# Patient Record
Sex: Female | Born: 1990 | Race: White | Hispanic: No | Marital: Married | Smoking: Former smoker
Health system: Southern US, Community
[De-identification: ages and names within clinical notes are randomized; demographics above are authoritative.]

## PROBLEM LIST (undated history)

## (undated) ENCOUNTER — Inpatient Hospital Stay (HOSPITAL_COMMUNITY): Payer: Self-pay

## (undated) DIAGNOSIS — F99 Mental disorder, not otherwise specified: Secondary | ICD-10-CM

## (undated) DIAGNOSIS — N809 Endometriosis, unspecified: Secondary | ICD-10-CM

## (undated) DIAGNOSIS — F32A Depression, unspecified: Secondary | ICD-10-CM

## (undated) DIAGNOSIS — F329 Major depressive disorder, single episode, unspecified: Secondary | ICD-10-CM

## (undated) DIAGNOSIS — L410 Pityriasis lichenoides et varioliformis acuta: Secondary | ICD-10-CM

## (undated) DIAGNOSIS — R87629 Unspecified abnormal cytological findings in specimens from vagina: Secondary | ICD-10-CM

## (undated) DIAGNOSIS — R809 Proteinuria, unspecified: Secondary | ICD-10-CM

## (undated) DIAGNOSIS — T1490XA Injury, unspecified, initial encounter: Secondary | ICD-10-CM

## (undated) DIAGNOSIS — Z8619 Personal history of other infectious and parasitic diseases: Secondary | ICD-10-CM

## (undated) DIAGNOSIS — J45909 Unspecified asthma, uncomplicated: Secondary | ICD-10-CM

## (undated) HISTORY — DX: Major depressive disorder, single episode, unspecified: F32.9

## (undated) HISTORY — DX: Depression, unspecified: F32.A

## (undated) HISTORY — PX: WISDOM TOOTH EXTRACTION: SHX21

## (undated) HISTORY — DX: Mental disorder, not otherwise specified: F99

## (undated) HISTORY — DX: Endometriosis, unspecified: N80.9

## (undated) HISTORY — DX: Unspecified abnormal cytological findings in specimens from vagina: R87.629

## (undated) HISTORY — DX: Personal history of other infectious and parasitic diseases: Z86.19

## (undated) HISTORY — DX: Pityriasis lichenoides et varioliformis acuta: L41.0

## (undated) HISTORY — DX: Injury, unspecified, initial encounter: T14.90XA

---
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
## 2012-06-24 ENCOUNTER — Encounter (HOSPITAL_COMMUNITY): Payer: Self-pay | Admitting: *Deleted

## 2012-06-24 ENCOUNTER — Emergency Department (HOSPITAL_COMMUNITY)
Admission: EM | Admit: 2012-06-24 | Discharge: 2012-06-24 | Disposition: A | Discharge status: Home / Self Care | Payer: PRIVATE HEALTH INSURANCE | Attending: Emergency Medicine | Admitting: Emergency Medicine

## 2012-06-24 DIAGNOSIS — R509 Fever, unspecified: Secondary | ICD-10-CM | POA: Insufficient documentation

## 2012-06-24 DIAGNOSIS — IMO0001 Reserved for inherently not codable concepts without codable children: Secondary | ICD-10-CM | POA: Insufficient documentation

## 2012-06-24 DIAGNOSIS — R3915 Urgency of urination: Secondary | ICD-10-CM | POA: Insufficient documentation

## 2012-06-24 DIAGNOSIS — F17200 Nicotine dependence, unspecified, uncomplicated: Secondary | ICD-10-CM | POA: Insufficient documentation

## 2012-06-24 DIAGNOSIS — N10 Acute tubulo-interstitial nephritis: Secondary | ICD-10-CM | POA: Insufficient documentation

## 2012-06-24 DIAGNOSIS — M549 Dorsalgia, unspecified: Secondary | ICD-10-CM | POA: Insufficient documentation

## 2012-06-24 DIAGNOSIS — R5381 Other malaise: Secondary | ICD-10-CM | POA: Insufficient documentation

## 2012-06-24 DIAGNOSIS — R350 Frequency of micturition: Secondary | ICD-10-CM | POA: Insufficient documentation

## 2012-06-24 DIAGNOSIS — R112 Nausea with vomiting, unspecified: Secondary | ICD-10-CM | POA: Insufficient documentation

## 2012-06-24 DIAGNOSIS — Z3202 Encounter for pregnancy test, result negative: Secondary | ICD-10-CM | POA: Insufficient documentation

## 2012-06-24 DIAGNOSIS — R300 Dysuria: Secondary | ICD-10-CM | POA: Insufficient documentation

## 2012-06-24 LAB — BASIC METABOLIC PANEL
Calcium: 8.9 mg/dL (ref 8.4–10.5)
GFR calc Af Amer: >90 mL/min (ref >90)
GFR calc non Af Amer: >90 mL/min (ref >90)
Glucose, Bld: 133 mg/dL — ABNORMAL HIGH (ref 70–99)
Potassium: 4 mEq/L (ref 3.5–5.1)
Sodium: 134 mEq/L — ABNORMAL LOW (ref 135–145)

## 2012-06-24 LAB — CBC WITH DIFFERENTIAL/PLATELET
Basophils Absolute: 0 10*3/uL (ref 0.0–0.1)
Eosinophils Absolute: 0 10*3/uL (ref 0.0–0.7)
Lymphs Abs: 0.7 10*3/uL (ref 0.7–4.0)
MCH: 30.9 pg (ref 26.0–34.0)
Neutrophils Relative %: 87 % — ABNORMAL HIGH (ref 43–77)
Platelets: 230 10*3/uL (ref 150–400)
RBC: 4.4 MIL/uL (ref 3.87–5.11)
RDW: 12.3 % (ref 11.5–15.5)
WBC: 14.4 10*3/uL — ABNORMAL HIGH (ref 4.0–10.5)

## 2012-06-24 LAB — URINALYSIS, ROUTINE W REFLEX MICROSCOPIC
Ketones, ur: NEGATIVE mg/dL
Nitrite: POSITIVE — AB
Protein, ur: 30 mg/dL — AB
Urobilinogen, UA: 0.2 mg/dL (ref 0.0–1.0)

## 2012-06-24 LAB — URINE MICROSCOPIC-ADD ON

## 2012-06-24 LAB — PREGNANCY, URINE: Preg Test, Ur: NEGATIVE

## 2012-06-24 MED ORDER — HYDROCODONE-ACETAMINOPHEN 5-325 MG PO TABS: ORAL_TABLET | ORAL | Status: DC

## 2012-06-24 MED ORDER — ACETAMINOPHEN 325 MG PO TABS
650.0000 mg | ORAL_TABLET | Freq: Once | ORAL | Status: AC
  Administered 2012-06-24: 650 mg via ORAL
  Filled 2012-06-24: qty 2

## 2012-06-24 MED ORDER — SODIUM CHLORIDE 0.9 % IV SOLN
Freq: Once | INTRAVENOUS | Status: AC
  Administered 2012-06-24: 10:00:00 via INTRAVENOUS

## 2012-06-24 MED ORDER — CEPHALEXIN 500 MG PO CAPS: 500.0000 mg | ORAL_CAPSULE | Freq: Four times a day (QID) | ORAL | Status: DC

## 2012-06-24 MED ORDER — ONDANSETRON HCL 4 MG/2ML IJ SOLN
4.0000 mg | Freq: Once | INTRAMUSCULAR | Status: AC
  Administered 2012-06-24: 4 mg via INTRAVENOUS
  Filled 2012-06-24: qty 2

## 2012-06-24 MED ORDER — DEXTROSE 5 % IV SOLN
1.0000 g | Freq: Once | INTRAVENOUS | Status: AC
  Administered 2012-06-24: 1 g via INTRAVENOUS
  Filled 2012-06-24: qty 10

## 2012-06-24 MED ORDER — MORPHINE SULFATE 4 MG/ML IJ SOLN
4.0000 mg | Freq: Once | INTRAMUSCULAR | Status: AC
  Administered 2012-06-24: 4 mg via INTRAVENOUS
  Filled 2012-06-24: qty 1

## 2012-06-24 NOTE — ED Notes (Signed)
Pt c/o left flank pain that started a few days ago, fever, n/v

## 2012-06-24 NOTE — ED Provider Notes (Signed)
History     CSN: [id number]  Arrival date & time 06/24/12  0903   First MD Initiated Contact with Patient 06/24/12 0945      Chief Complaint  Patient presents with  . Flank Pain    (Consider location/radiation/quality/duration/timing/severity/associated sxs/prior treatment) HPI Comments: Patient reports left flank tenderness and dysuria for one week.  States that several hours PTA, since began having chills, fever, and nausea vomiting.  She states that she has a hx of frequent urinary tract infections and "problems with my kidneys" in the past.  She denies vaginal discharge, pelvic pain, pain with intercourse or vaginal bleeding.  States the pain feels like a "kidney infection".  She denies shortness of breath, abdominal pain, hx of PID or kidney stones, chest pain or recent cough  Patient is a 22 y.o. female presenting with flank pain. The history is provided by the patient.  Flank Pain This is a new problem. The current episode started in the past 7 days. The problem occurs constantly. The problem has been gradually worsening. Associated symptoms include chills, fatigue, a fever, myalgias, nausea, urinary symptoms and vomiting. Pertinent negatives include no abdominal pain, arthralgias, change in bowel habit, chest pain, congestion, coughing, diaphoresis, headaches, joint swelling, neck pain, numbness, rash, sore throat, swollen glands, vertigo, visual change or weakness. Exacerbated by: urinating. Treatments tried: OTC AZO and tylenol. The treatment provided no relief.    History reviewed. No pertinent past medical history.  History reviewed. No pertinent past surgical history.  No family history on file.  History  Substance Use Topics  . Smoking status: Current Every Day Smoker  . Smokeless tobacco: Not on file  . Alcohol Use: Yes    OB History   Grav Para Term Preterm Abortions TAB SAB Ect Mult Living                  Review of Systems  Constitutional: Positive for  fever, chills and fatigue. Negative for diaphoresis.  HENT: Negative for congestion, sore throat, neck pain and neck stiffness.   Respiratory: Negative for cough.   Cardiovascular: Negative for chest pain.  Gastrointestinal: Positive for nausea and vomiting. Negative for abdominal pain and change in bowel habit.  Genitourinary: Positive for dysuria, urgency, frequency and flank pain. Negative for hematuria, decreased urine volume, vaginal bleeding, vaginal discharge, difficulty urinating, genital sores, menstrual problem and pelvic pain.  Musculoskeletal: Positive for myalgias and back pain. Negative for joint swelling and arthralgias.  Skin: Negative for rash.  Neurological: Negative for dizziness, vertigo, syncope, weakness, numbness and headaches.  All other systems reviewed and are negative.    Allergies  Review of patient's allergies indicates no known allergies.  Home Medications  No current outpatient prescriptions on file.  BP 119/63  Pulse 98  Temp(Src) 98.8 F (37.1 C) (Oral)  Resp 20  Ht 5\' 6"  (1.676 m)  Wt 150 lb (68.04 kg)  BMI 24.22 kg/m2  SpO2 96%  LMP 06/03/2012  Physical Exam  Nursing note and vitals reviewed. Constitutional: She is oriented to person, place, and time. She appears well-developed and well-nourished.  Appears uncomfortable  HENT:  Head: Normocephalic and atraumatic.  Mouth/Throat: Oropharynx is clear and moist.  Neck: Normal range of motion. Neck supple. No thyromegaly present.  Cardiovascular: Regular rhythm, normal heart sounds and intact distal pulses.   No murmur heard. tachycardia  Pulmonary/Chest: Effort normal and breath sounds normal. No respiratory distress. She has no wheezes. She has no rales. She exhibits no tenderness.  Abdominal:  Soft. Bowel sounds are normal. She exhibits no distension and no mass. There is no hepatosplenomegaly, splenomegaly or hepatomegaly. There is no tenderness. There is CVA tenderness. There is no rebound  and no guarding.  Left sided CVA tenderness on exam.  No edema or ecchymosis.   Musculoskeletal: Normal range of motion. She exhibits no edema and no tenderness.  Lymphadenopathy:    She has no cervical adenopathy.  Neurological: She is alert and oriented to person, place, and time. She exhibits normal muscle tone. Coordination normal.  Skin: Skin is warm and dry.  Psychiatric: She has a normal mood and affect. Her behavior is normal.    ED Course  Procedures (including critical care time)  Labs Reviewed  CBC WITH DIFFERENTIAL - Abnormal; Notable for the following:    WBC 14.4 (*)    Neutrophils Relative 87 (*)    Neutro Abs 12.5 (*)    Lymphocytes Relative 5 (*)    Monocytes Absolute 1.1 (*)    All other components within normal limits  BASIC METABOLIC PANEL - Abnormal; Notable for the following:    Sodium 134 (*)    Glucose, Bld 133 (*)    All other components within normal limits  URINALYSIS, ROUTINE W REFLEX MICROSCOPIC - Abnormal; Notable for the following:    APPearance CLOUDY (*)    Hgb urine dipstick MODERATE (*)    Protein, ur 30 (*)    Nitrite POSITIVE (*)    Leukocytes, UA SMALL (*)    All other components within normal limits  URINE MICROSCOPIC-ADD ON - Abnormal; Notable for the following:    Squamous Epithelial / LPF MANY (*)    Bacteria, UA FEW (*)    All other components within normal limits  URINE CULTURE  PREGNANCY, URINE      Urine culture pending  MDM  Patient with hx of "kidney problems" and frequent UTI's reported having dysuria a week ago and self treated with AZO.  On exam she has left CVA tenderness, fever and hx of vomiting.  clinical picture is c/w pyelonephritis.  Will order labs and rehydrate.    On re-evaluation, patient is feeling better, vitals improved,previous  tachycardia and fever improved. Labs also support diagnosis of pyelo.  Kidney function is wnml, She is non-toxic appearing.  No further nausea or vomiting during ED stay.     She has received IVF's, Rocephin, anti-emetic and morphine.  Patient is requesting to go home.  She agrees to close f/u with her PMD or to return here in 1-2 days if the symptoms are not improving.  Will prescribe keflex for 7 days and advised pt of importance to finished abx and have urine rechecked after completion of abx.        Lenka Zhao L. Trisha Mangle, PA-C 06/26/12 2218

## 2012-06-26 LAB — URINE CULTURE: Colony Count: >=100000

## 2012-06-27 NOTE — ED Notes (Signed)
+   urine Patient treated with keflex-sensitive to same-chart appended per protocol MD. 

## 2012-06-27 NOTE — ED Provider Notes (Signed)
Medical screening examination/treatment/procedure(s) were performed by non-physician practitioner and as supervising physician I was immediately available for consultation/collaboration.   Chamya Hunton W. Aristide Waggle, MD 06/27/12 1551 

## 2013-09-23 IMAGING — CR DG FOREARM 2V*L*
1 series · 1 of 1 positions shown · non-contrast
Comparison: None.

CLINICAL DATA: Medial arm pain.

LEFT FOREARM - 2 VIEW

[view not recorded]
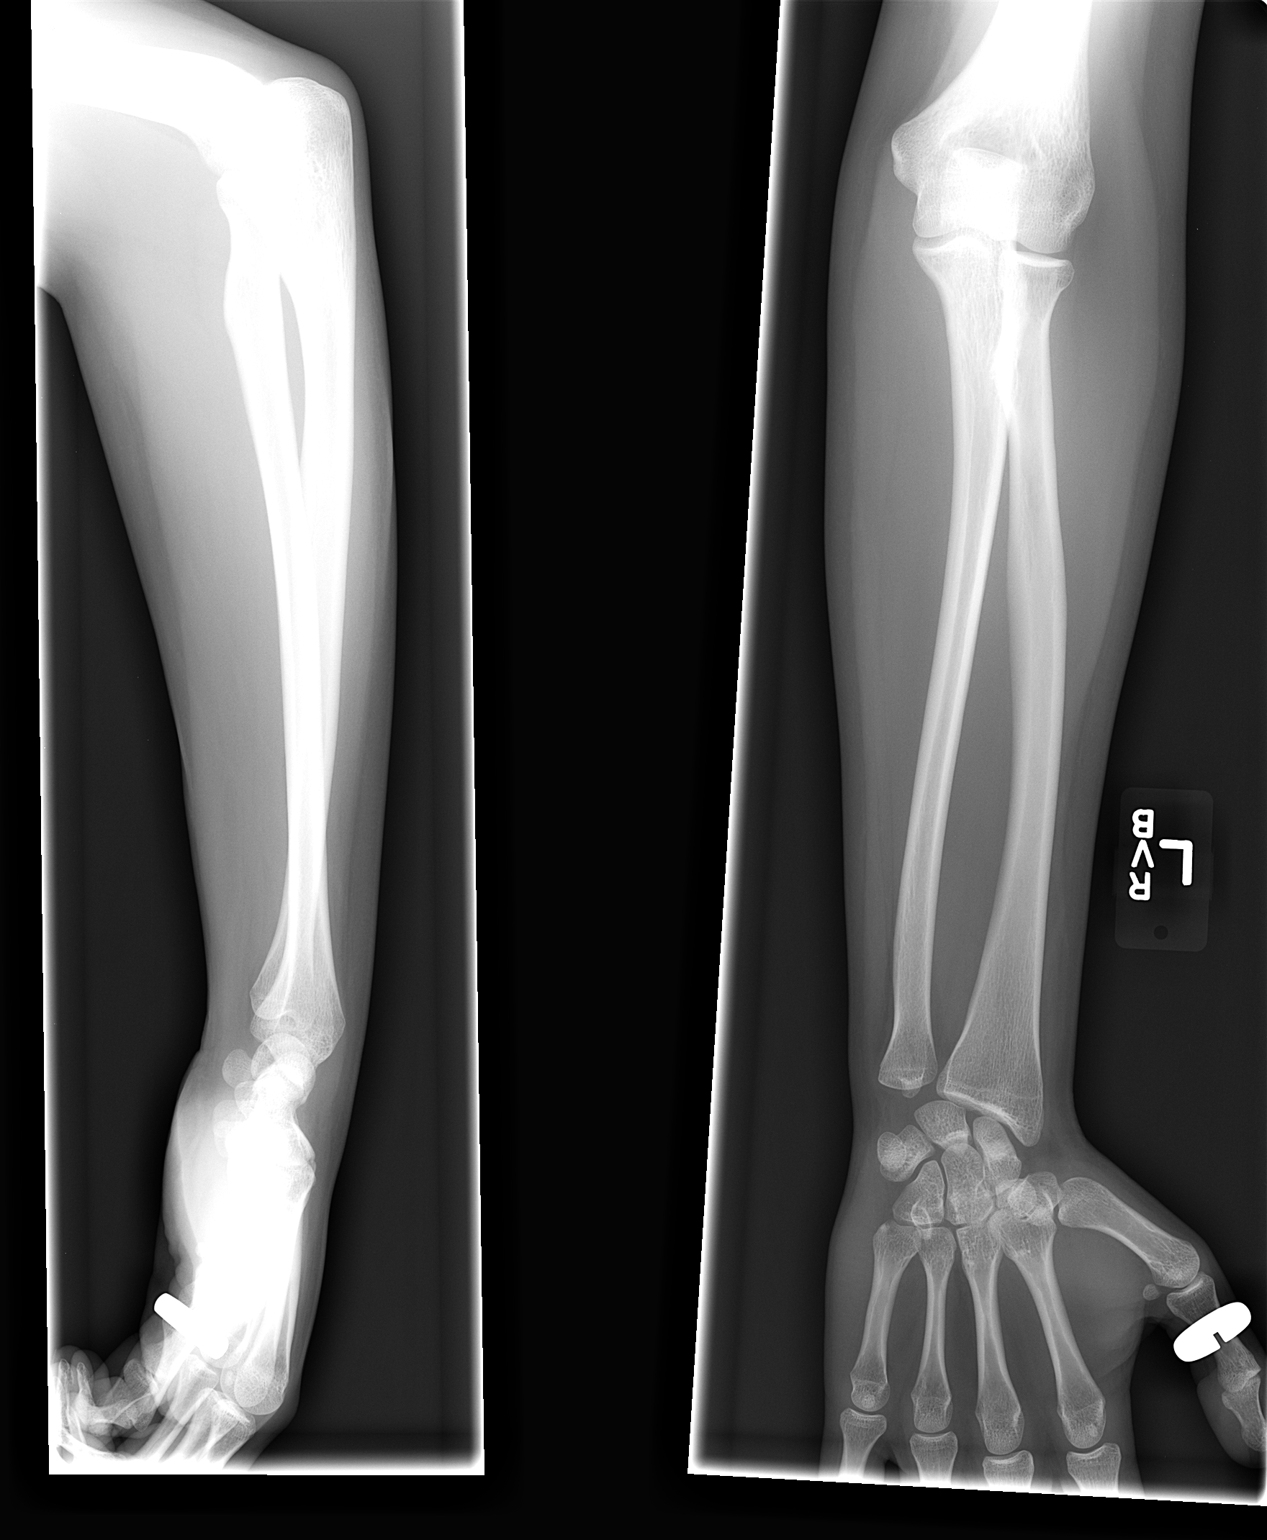

[1 of 1 positions shown; findings below may reference images not displayed]

FINDINGS: Imaged bones, joints and soft tissues appear normal.
IMPRESSION: Negative study.

## 2017-04-07 ENCOUNTER — Ambulatory Visit (INDEPENDENT_AMBULATORY_CARE_PROVIDER_SITE_OTHER): Payer: BLUE CROSS/BLUE SHIELD | Admitting: Adult Health

## 2017-04-07 ENCOUNTER — Encounter (INDEPENDENT_AMBULATORY_CARE_PROVIDER_SITE_OTHER): Payer: Self-pay

## 2017-04-07 ENCOUNTER — Encounter: Payer: Self-pay | Admitting: Adult Health

## 2017-04-07 ENCOUNTER — Other Ambulatory Visit (HOSPITAL_COMMUNITY)
Admission: RE | Admit: 2017-04-07 | Discharge: 2017-04-07 | Disposition: A | Discharge status: Home / Self Care | Payer: BLUE CROSS/BLUE SHIELD | Source: Ambulatory Visit | Attending: Adult Health | Admitting: Adult Health

## 2017-04-07 ENCOUNTER — Other Ambulatory Visit: Payer: Self-pay | Admitting: Adult Health

## 2017-04-07 VITALS — BP 92/60 | HR 97 | Ht 66.0 in | Wt 150.0 lb

## 2017-04-07 DIAGNOSIS — Z01419 Encounter for gynecological examination (general) (routine) without abnormal findings: Secondary | ICD-10-CM | POA: Insufficient documentation

## 2017-04-07 DIAGNOSIS — Z3202 Encounter for pregnancy test, result negative: Secondary | ICD-10-CM | POA: Diagnosis not present

## 2017-04-07 DIAGNOSIS — F418 Other specified anxiety disorders: Secondary | ICD-10-CM | POA: Insufficient documentation

## 2017-04-07 DIAGNOSIS — N946 Dysmenorrhea, unspecified: Secondary | ICD-10-CM | POA: Insufficient documentation

## 2017-04-07 DIAGNOSIS — N941 Unspecified dyspareunia: Secondary | ICD-10-CM | POA: Insufficient documentation

## 2017-04-07 DIAGNOSIS — Z8742 Personal history of other diseases of the female genital tract: Secondary | ICD-10-CM | POA: Diagnosis not present

## 2017-04-07 DIAGNOSIS — Z01411 Encounter for gynecological examination (general) (routine) with abnormal findings: Secondary | ICD-10-CM | POA: Diagnosis not present

## 2017-04-07 DIAGNOSIS — F329 Major depressive disorder, single episode, unspecified: Secondary | ICD-10-CM | POA: Diagnosis not present

## 2017-04-07 DIAGNOSIS — N920 Excessive and frequent menstruation with regular cycle: Secondary | ICD-10-CM | POA: Insufficient documentation

## 2017-04-07 DIAGNOSIS — F32A Depression, unspecified: Secondary | ICD-10-CM

## 2017-04-07 LAB — POCT URINE PREGNANCY: PREG TEST UR: NEGATIVE

## 2017-04-07 MED ORDER — PRENATAL GUMMIES/DHA & FA 0.4-32.5 MG PO CHEW: 3.0000 | CHEWABLE_TABLET | Freq: Every day | ORAL | Status: DC
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
## 2017-04-07 NOTE — Patient Instructions (Signed)
Preparing for Pregnancy If you are considering becoming pregnant, make an appointment to see your regular health care provider to learn how to prepare for a safe and healthy pregnancy (preconception care). During a preconception care visit, your health care provider will:  Do a complete physical exam, including a Pap test.  Take a complete medical history.  Give you information, answer your questions, and help you resolve problems.  Preconception checklist Medical history  Tell your health care provider about any current or past medical conditions. Your pregnancy or your ability to become pregnant may be affected by chronic conditions, such as diabetes, chronic hypertension, and thyroid problems.  Include your family's medical history as well as your partner's medical history.  Tell your health care provider about any history of STIs (sexually transmitted infections).These can affect your pregnancy. In some cases, they can be passed to your baby. Discuss any concerns that you have about STIs.  If indicated, discuss the benefits of genetic testing. This testing will show whether there are any genetic conditions that may be passed from you or your partner to your baby.  Tell your health care provider about: ? Any problems you have had with conception or pregnancy. ? Any medicines you take. These include vitamins, herbal supplements, and over-the-counter medicines. ? Your history of immunizations. Discuss any vaccinations that you may need.  Diet  Ask your health care provider what to include in a healthy diet that has a balance of nutrients. This is especially important when you are pregnant or preparing to become pregnant.  Ask your health care provider to help you reach a healthy weight before pregnancy. ? If you are overweight, you may be at higher risk for certain complications, such as high blood pressure, diabetes, and preterm birth. ? If you are underweight, you are more likely  to have a baby who has a low birth weight.  Lifestyle, work, and home  Let your health care provider know: ? About any lifestyle habits that you have, such as alcohol use, drug use, or smoking. ? About recreational activities that may put you at risk during pregnancy, such as downhill skiing and certain exercise programs. ? Tell your health care provider about any international travel, especially any travel to places with an active Zika virus outbreak. ? About harmful substances that you may be exposed to at work or at home. These include chemicals, pesticides, radiation, or even litter boxes. ? If you do not feel safe at home.  Mental health  Tell your health care provider about: ? Any history of mental health conditions, including feelings of depression, sadness, or anxiety. ? Any medicines that you take for a mental health condition. These include herbs and supplements.  Home instructions to prepare for pregnancy Lifestyle  Eat a balanced diet. This includes fresh fruits and vegetables, whole grains, lean meats, low-fat dairy products, healthy fats, and foods that are high in fiber. Ask to meet with a nutritionist or registered dietitian for assistance with meal planning and goals.  Get regular exercise. Try to be active for at least 30 minutes a day on most days of the week. Ask your health care provider which activities are safe during pregnancy.  Do not use any products that contain nicotine or tobacco, such as cigarettes and e-cigarettes. If you need help quitting, ask your health care provider.  Do not drink alcohol.  Do not take illegal drugs.  Maintain a healthy weight. Ask your health care provider what weight range is   right for you.  General instructions  Keep an accurate record of your menstrual periods. This makes it easier for your health care provider to determine your baby's due date.  Begin taking prenatal vitamins and folic acid supplements daily as directed by  your health care provider.  Manage any chronic conditions, such as high blood pressure and diabetes, as told by your health care provider. This is important.  How do I know that I am pregnant? You may be pregnant if you have been sexually active and you miss your period. Symptoms of early pregnancy include:  Mild cramping.  Very light vaginal bleeding (spotting).  Feeling unusually tired.  Nausea and vomiting (morning sickness).  If you have any of these symptoms and you suspect that you might be pregnant, you can take a home pregnancy test. These tests check for a hormone in your urine (human chorionic gonadotropin, or hCG). A woman's body begins to make this hormone during early pregnancy. These tests are very accurate. Wait until at least the first day after you miss your period to take one. If the test shows that you are pregnant (you get a positive result), call your health care provider to make an appointment for prenatal care. What should I do if I become pregnant?  Make an appointment with your health care provider as soon as you suspect you are pregnant.  Do not use any products that contain nicotine, such as cigarettes, chewing tobacco, and e-cigarettes. If you need help quitting, ask your health care provider.  Do not drink alcoholic beverages. Alcohol is related to a number of birth defects.  Avoid toxic odors and chemicals.  You may continue to have sexual intercourse if it does not cause pain or other problems, such as vaginal bleeding. This information is not intended to replace advice given to you by your health care provider. Make sure you discuss any questions you have with your health care provider. Document Released: 01/28/2008 Document Revised: 10/13/2015 Document Reviewed: 09/06/2015 Elsevier Interactive Patient Education  2018 [employer]  

## 2017-04-10 LAB — CYTOLOGY - PAP
CHLAMYDIA, DNA PROBE: NEGATIVE
Diagnosis: NEGATIVE
Neisseria Gonorrhea: NEGATIVE

## 2018-01-10 ENCOUNTER — Encounter: Payer: Self-pay | Admitting: Obstetrics and Gynecology

## 2018-01-10 ENCOUNTER — Ambulatory Visit: Payer: Self-pay | Admitting: Obstetrics and Gynecology

## 2018-01-10 DIAGNOSIS — Z3169 Encounter for other general counseling and advice on procreation: Secondary | ICD-10-CM

## 2018-01-10 MED ORDER — PRENATAL VITAMINS 0.8 MG PO TABS: 1.0000 | ORAL_TABLET | Freq: Every day | ORAL | 12 refills | Status: DC

## 2018-01-10 NOTE — Patient Instructions (Signed)

## 2018-01-10 NOTE — Progress Notes (Signed)
Ms Cheyenne Bauer is her for preconception counseling. She and her husband now desire children She is a nulligravid. Regular monthly cycles H/O endometriosis (Dx by Sx) and Insomnia She does use "Sleep aid" for insomnia Stopped smoking 2 months ago Husband healthily, no children Pap this past yr normal  PE AF VSS Lungs clear Heart RRR Abd soft + BS  A/P Preconception Counseling Diet, exercise and PNV reviewed with pt. Apps for timing IC to increase fertility reviewed F/U with pregnancy or PRN

## 2018-02-13 ENCOUNTER — Telehealth: Payer: Self-pay | Admitting: *Deleted

## 2018-02-13 NOTE — Telephone Encounter (Signed)
Patient called stating she is a week late for her period.  She and her husband are trying to conceive. She has taken a pregnancy test but it was negative but is having severe cramping and bleeding with clots since 6am this morning. She has endometriosis and is used to the pain but states this pain is very different. She has tried taking Ibuprofen, used a heating pad but nothing is touching the pain. Please advise.

## 2018-02-13 NOTE — Telephone Encounter (Signed)
Spoke to patient and advised to go to ER per JAG for U/S and labs.  Pt verbalized understanding with no further questions.
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
## 2018-04-18 ENCOUNTER — Other Ambulatory Visit: Payer: Self-pay

## 2018-04-18 ENCOUNTER — Encounter: Payer: Self-pay | Admitting: Adult Health

## 2018-04-18 ENCOUNTER — Ambulatory Visit (INDEPENDENT_AMBULATORY_CARE_PROVIDER_SITE_OTHER): Payer: BLUE CROSS/BLUE SHIELD | Admitting: Adult Health

## 2018-04-18 VITALS — BP 132/86 | HR 92 | Ht 65.0 in | Wt 160.0 lb

## 2018-04-18 DIAGNOSIS — N926 Irregular menstruation, unspecified: Secondary | ICD-10-CM | POA: Diagnosis not present

## 2018-04-18 DIAGNOSIS — O09291 Supervision of pregnancy with other poor reproductive or obstetric history, first trimester: Secondary | ICD-10-CM | POA: Diagnosis not present

## 2018-04-18 DIAGNOSIS — Z3A01 Less than 8 weeks gestation of pregnancy: Secondary | ICD-10-CM | POA: Insufficient documentation

## 2018-04-18 DIAGNOSIS — O3680X0 Pregnancy with inconclusive fetal viability, not applicable or unspecified: Secondary | ICD-10-CM

## 2018-04-18 DIAGNOSIS — Z3201 Encounter for pregnancy test, result positive: Secondary | ICD-10-CM | POA: Diagnosis not present

## 2018-04-18 LAB — POCT URINE PREGNANCY: Preg Test, Ur: POSITIVE — AB

## 2018-04-18 NOTE — Progress Notes (Signed)
Patient ID: Cheyenne Bauer, female   DOB: 1990-09-08, 28 y.o.   MRN: [id number] History of Present Illness:  Cheyenne Bauer is a 28 year old white female, married, in for physical but changed to just UPT.Had +HPT this am.She had miscarriage in December.  PCP is Dr Reuel Boom.  Current Medications, Allergies, Past Medical History, Past Surgical History, Family History and Social History were reviewed in [name] record.     Review of Systems: +missed period, +HPT this am Mild cramps   Physical Exam:BP 132/86 (BP Location: Left Arm, Patient Position: Sitting, Cuff Size: Normal)   Pulse 92   Ht 5\' 5"  (1.651 m)   Wt 160 lb (72.6 kg)   LMP 03/16/2018 (Exact Date)   BMI 26.63 kg/m   UPT+, about 4+5 weeks by LMP with EDD 12/21/2018 General:  Well developed, well nourished, no acute distress Skin:  Warm and dry Neck:  Midline trachea, normal thyroid, good ROM, no lymphadenopathy Lungs; Clear to auscultation bilaterally Cardiovascular: Regular rate and rhythm Abdomen:  Soft, non tender Psych:  No mood changes, alert and cooperative,seems happy Fall risk is low. PHQ 9 score 21, sad still from miscarriage in December, denies being suicidal, does not needs meds.  Impression: 1. Pregnancy test positive   2. Less than [redacted] weeks gestation of pregnancy   3. Encounter to determine fetal viability of pregnancy, single or unspecified fetus   4. History of miscarriage, currently pregnant, first trimester       Plan: Check QHCG and progesterone Continue PNV Return in 2 weeks for dating US/4 weeks new OB Review handouts on First trimester and by [facility] and Preparing for [employer]

## 2018-04-18 NOTE — Patient Instructions (Signed)
First Trimester of Pregnancy  The first trimester of pregnancy is from week 1 until the end of week 13 (months 1 through 3). A week after a sperm fertilizes an egg, the egg will implant on the wall of the uterus. This embryo will begin to develop into a baby. Genes from you and your partner will form the baby. The female genes will determine whether the baby will be a boy or a girl. At 6-8 weeks, the eyes and face will be formed, and the heartbeat can be seen on ultrasound. At the end of 12 weeks, all the baby's organs will be formed.  Now that you are pregnant, you will want to do everything you can to have a healthy baby. Two of the most important things are to get good prenatal care and to follow your health care provider's instructions. Prenatal care is all the medical care you receive before the baby's birth. This care will help prevent, find, and treat any problems during the pregnancy and childbirth.  Body changes during your first trimester  Your body goes through many changes during pregnancy. The changes vary from woman to woman.   You may gain or lose a couple of pounds at first.   You may feel sick to your stomach (nauseous) and you may throw up (vomit). If the vomiting is uncontrollable, call your health care provider.   You may tire easily.   You may develop headaches that can be relieved by medicines. All medicines should be approved by your health care provider.   You may urinate more often. Painful urination may mean you have a bladder infection.   You may develop heartburn as a result of your pregnancy.   You may develop constipation because certain hormones are causing the muscles that push stool through your intestines to slow down.   You may develop hemorrhoids or swollen veins (varicose veins).   Your breasts may begin to grow larger and become tender. Your nipples may stick out more, and the tissue that surrounds them (areola) may become darker.   Your gums may bleed and may be  sensitive to brushing and flossing.   Dark spots or blotches (chloasma, mask of pregnancy) may develop on your face. This will likely fade after the baby is born.   Your menstrual periods will stop.   You may have a loss of appetite.   You may develop cravings for certain kinds of food.   You may have changes in your emotions from day to day, such as being excited to be pregnant or being concerned that something may go wrong with the pregnancy and baby.   You may have more vivid and strange dreams.   You may have changes in your hair. These can include thickening of your hair, rapid growth, and changes in texture. Some women also have hair loss during or after pregnancy, or hair that feels dry or thin. Your hair will most likely return to normal after your baby is born.  What to expect at prenatal visits  During a routine prenatal visit:   You will be weighed to make sure you and the baby are growing normally.   Your blood pressure will be taken.   Your abdomen will be measured to track your baby's growth.   The fetal heartbeat will be listened to between weeks 10 and 14 of your pregnancy.   Test results from any previous visits will be discussed.  Your health care provider may ask you:     How you are feeling.   If you are feeling the baby move.   If you have had any abnormal symptoms, such as leaking fluid, bleeding, severe headaches, or abdominal cramping.   If you are using any tobacco products, including cigarettes, chewing tobacco, and electronic cigarettes.   If you have any questions.  Other tests that may be performed during your first trimester include:   Blood tests to find your blood type and to check for the presence of any previous infections. The tests will also be used to check for low iron levels (anemia) and protein on red blood cells (Rh antibodies). Depending on your risk factors, or if you previously had diabetes during pregnancy, you may have tests to check for high blood sugar  that affects pregnant women (gestational diabetes).   Urine tests to check for infections, diabetes, or protein in the urine.   An ultrasound to confirm the proper growth and development of the baby.   Fetal screens for spinal cord problems (spina bifida) and Down syndrome.   HIV (human immunodeficiency virus) testing. Routine prenatal testing includes screening for HIV, unless you choose not to have this test.   You may need other tests to make sure you and the baby are doing well.  Follow these instructions at home:  Medicines   Follow your health care provider's instructions regarding medicine use. Specific medicines may be either safe or unsafe to take during pregnancy.   Take a prenatal vitamin that contains at least 600 micrograms (mcg) of folic acid.   If you develop constipation, try taking a stool softener if your health care provider approves.  Eating and drinking     Eat a balanced diet that includes fresh fruits and vegetables, whole grains, good sources of protein such as meat, eggs, or tofu, and low-fat dairy. Your health care provider will help you determine the amount of weight gain that is right for you.   Avoid raw meat and uncooked cheese. These carry germs that can cause birth defects in the baby.   Eating four or five small meals rather than three large meals a day may help relieve nausea and vomiting. If you start to feel nauseous, eating a few soda crackers can be helpful. Drinking liquids between meals, instead of during meals, also seems to help ease nausea and vomiting.   Limit foods that are high in fat and processed sugars, such as fried and sweet foods.   To prevent constipation:  ? Eat foods that are high in fiber, such as fresh fruits and vegetables, whole grains, and beans.  ? Drink enough fluid to keep your urine clear or pale yellow.  Activity   Exercise only as directed by your health care provider. Most women can continue their usual exercise routine during  pregnancy. Try to exercise for 30 minutes at least 5 days a week. Exercising will help you:  ? Control your weight.  ? Stay in shape.  ? Be prepared for labor and delivery.   Experiencing pain or cramping in the lower abdomen or lower back is a good sign that you should stop exercising. Check with your health care provider before continuing with normal exercises.   Try to avoid standing for long periods of time. Move your legs often if you must stand in one place for a long time.   Avoid heavy lifting.   Wear low-heeled shoes and practice good posture.   You may continue to have sex unless your health care   provider tells you not to.  Relieving pain and discomfort   Wear a good support bra to relieve breast tenderness.   Take warm sitz baths to soothe any pain or discomfort caused by hemorrhoids. Use hemorrhoid cream if your health care provider approves.   Rest with your legs elevated if you have leg cramps or low back pain.   If you develop varicose veins in your legs, wear support hose. Elevate your feet for 15 minutes, 3-4 times a day. Limit salt in your diet.  Prenatal care   Schedule your prenatal visits by the twelfth week of pregnancy. They are usually scheduled monthly at first, then more often in the last 2 months before delivery.   Write down your questions. Take them to your prenatal visits.   Keep all your prenatal visits as told by your health care provider. This is important.  Safety   Wear your seat belt at all times when driving.   Make a list of emergency phone numbers, including numbers for family, friends, the hospital, and police and fire departments.  General instructions   Ask your health care provider for a referral to a local prenatal education class. Begin classes no later than the beginning of month 6 of your pregnancy.   Ask for help if you have counseling or nutritional needs during pregnancy. Your health care provider can offer advice or refer you to specialists for help  with various needs.   Do not use hot tubs, steam rooms, or saunas.   Do not douche or use tampons or scented sanitary pads.   Do not cross your legs for long periods of time.   Avoid cat litter boxes and soil used by cats. These carry germs that can cause birth defects in the baby and possibly loss of the fetus by miscarriage or stillbirth.   Avoid all smoking, herbs, alcohol, and medicines not prescribed by your health care provider. Chemicals in these products affect the formation and growth of the baby.   Do not use any products that contain nicotine or tobacco, such as cigarettes and e-cigarettes. If you need help quitting, ask your health care provider. You may receive counseling support and other resources to help you quit.   Schedule a dentist appointment. At home, brush your teeth with a soft toothbrush and be gentle when you floss.  Contact a health care provider if:   You have dizziness.   You have mild pelvic cramps, pelvic pressure, or nagging pain in the abdominal area.   You have persistent nausea, vomiting, or diarrhea.   You have a bad smelling vaginal discharge.   You have pain when you urinate.   You notice increased swelling in your face, hands, legs, or ankles.   You are exposed to fifth disease or chickenpox.   You are exposed to German measles (rubella) and have never had it.  Get help right away if:   You have a fever.   You are leaking fluid from your vagina.   You have spotting or bleeding from your vagina.   You have severe abdominal cramping or pain.   You have rapid weight gain or loss.   You vomit blood or material that looks like coffee grounds.   You develop a severe headache.   You have shortness of breath.   You have any kind of trauma, such as from a fall or a car accident.  Summary   The first trimester of pregnancy is from week 1 until   the end of week 13 (months 1 through 3).   Your body goes through many changes during pregnancy. The changes vary from  woman to woman.   You will have routine prenatal visits. During those visits, your health care provider will examine you, discuss any test results you may have, and talk with you about how you are feeling.  This information is not intended to replace advice given to you by your health care provider. Make sure you discuss any questions you have with your health care provider.  Document Released: 02/08/2001 Document Revised: 01/27/2016 Document Reviewed: 01/27/2016  [employer]vier Interactive Patient Education  2019 [employer]

## 2018-04-19 LAB — PROGESTERONE: PROGESTERONE: 21.2 ng/mL

## 2018-04-19 LAB — BETA HCG QUANT (REF LAB): hCG Quant: 708 m[IU]/mL

## 2018-05-02 ENCOUNTER — Ambulatory Visit (INDEPENDENT_AMBULATORY_CARE_PROVIDER_SITE_OTHER): Payer: BLUE CROSS/BLUE SHIELD

## 2018-05-02 DIAGNOSIS — Z3A01 Less than 8 weeks gestation of pregnancy: Secondary | ICD-10-CM

## 2018-05-02 DIAGNOSIS — O3680X0 Pregnancy with inconclusive fetal viability, not applicable or unspecified: Secondary | ICD-10-CM | POA: Diagnosis not present

## 2018-05-02 NOTE — Progress Notes (Signed)
[place] 6+5 wks,single IUP w/ys,positive FHT 127 bpm,normal ovaries bilat

## 2018-05-03 ENCOUNTER — Telehealth: Payer: Self-pay | Admitting: Adult Health

## 2018-05-03 MED ORDER — PROMETHAZINE HCL 25 MG PO TABS: 25.0000 mg | ORAL_TABLET | Freq: Four times a day (QID) | ORAL | 1 refills | Status: DC | PRN

## 2018-05-03 NOTE — Telephone Encounter (Signed)
Spoke with pt. Pt is pregnant and has tried everything for vomiting. Pt has vomiting all day. Is there a med she can try? Please advise. Thanks!! JSY

## 2018-05-03 NOTE — Telephone Encounter (Signed)
rx phenergan 

## 2018-05-03 NOTE — Telephone Encounter (Signed)
SHe would like for Victorino Dike to give her a call today please

## 2018-05-03 NOTE — Addendum Note (Signed)
Addended by: Cyril Mourning A on: 05/03/2018 01:18 PM   Modules accepted: Orders

## 2018-05-07 ENCOUNTER — Other Ambulatory Visit: Payer: Self-pay | Admitting: Adult Health

## 2018-05-07 MED ORDER — PROMETHAZINE HCL 50 MG RE SUPP: 50.0000 mg | Freq: Four times a day (QID) | RECTAL | 0 refills | Status: DC | PRN

## 2018-05-07 NOTE — Progress Notes (Signed)
Phenergan 50 mg supp sent in for nausea and vomiting.

## 2018-05-14 ENCOUNTER — Ambulatory Visit (INDEPENDENT_AMBULATORY_CARE_PROVIDER_SITE_OTHER): Payer: BLUE CROSS/BLUE SHIELD | Admitting: Women's Health

## 2018-05-14 ENCOUNTER — Other Ambulatory Visit: Payer: Self-pay

## 2018-05-14 ENCOUNTER — Encounter: Payer: Self-pay | Admitting: Women's Health

## 2018-05-14 ENCOUNTER — Ambulatory Visit: Payer: BLUE CROSS/BLUE SHIELD | Admitting: *Deleted

## 2018-05-14 VITALS — BP 123/84 | HR 103 | Wt 152.0 lb

## 2018-05-14 DIAGNOSIS — Z23 Encounter for immunization: Secondary | ICD-10-CM | POA: Diagnosis not present

## 2018-05-14 DIAGNOSIS — O219 Vomiting of pregnancy, unspecified: Secondary | ICD-10-CM

## 2018-05-14 DIAGNOSIS — R1110 Vomiting, unspecified: Secondary | ICD-10-CM | POA: Insufficient documentation

## 2018-05-14 DIAGNOSIS — Z3490 Encounter for supervision of normal pregnancy, unspecified, unspecified trimester: Secondary | ICD-10-CM | POA: Insufficient documentation

## 2018-05-14 DIAGNOSIS — Z331 Pregnant state, incidental: Secondary | ICD-10-CM

## 2018-05-14 DIAGNOSIS — Z1389 Encounter for screening for other disorder: Secondary | ICD-10-CM

## 2018-05-14 DIAGNOSIS — Z3481 Encounter for supervision of other normal pregnancy, first trimester: Secondary | ICD-10-CM

## 2018-05-14 DIAGNOSIS — Z3A08 8 weeks gestation of pregnancy: Secondary | ICD-10-CM | POA: Diagnosis not present

## 2018-05-14 LAB — POCT URINALYSIS DIPSTICK OB
Blood, UA: NEGATIVE
Glucose, UA: NEGATIVE
Leukocytes, UA: NEGATIVE
Nitrite, UA: NEGATIVE
POC,PROTEIN,UA: NEGATIVE

## 2018-05-14 MED ORDER — PANTOPRAZOLE SODIUM 20 MG PO TBEC: 20.0000 mg | DELAYED_RELEASE_TABLET | Freq: Every day | ORAL | 6 refills | Status: DC

## 2018-05-14 MED ORDER — DOXYLAMINE-PYRIDOXINE ER 20-20 MG PO TBCR: 1.0000 | EXTENDED_RELEASE_TABLET | Freq: Every day | ORAL | 8 refills | Status: DC

## 2018-05-14 NOTE — Patient Instructions (Signed)
Cheyenne Bauer, I greatly value your feedback.  If you receive a survey following your visit with [place] today, we appreciate you taking the time to fill it out.  Thanks, Joellyn Haff, CNM, WHNP-BC   Nausea & Vomiting  Have saltine crackers or pretzels by your bed and eat a few bites before you raise your head out of bed in the morning  Eat small frequent meals throughout the day instead of large meals  Drink plenty of fluids throughout the day to stay hydrated, just don't drink a lot of fluids with your meals.  This can make your stomach fill up faster making you feel sick  Do not brush your teeth right after you eat  Products with real ginger are good for nausea, like ginger ale and ginger hard candy Make sure it says made with real ginger!  Sucking on sour candy like lemon heads is also good for nausea  If your prenatal vitamins make you nauseated, take them at night so you will sleep through the nausea  Sea Bands  If you feel like you need medicine for the nausea & vomiting please let us know  If you are unable to keep any fluids or food down please let us know   Constipation  Drink plenty of fluid, preferably water, throughout the day  Eat foods high in fiber such as fruits, vegetables, and grains  Exercise, such as walking, is a good way to keep your bowels regular  Drink warm fluids, especially warm prune juice, or decaf coffee  Eat a 1/2 cup of real oatmeal (not instant), 1/2 cup applesauce, and 1/2-1 cup warm prune juice every day  If needed, you may take Colace (docusate sodium) stool softener once or twice a day to help keep the stool soft. If you are pregnant, wait until you are out of your first trimester (12-14 weeks of pregnancy)  If you still are having problems with constipation, you may take Miralax once daily as needed to help keep your bowels regular.  If you are pregnant, wait until you are out of your first trimester (12-14 weeks of pregnancy)   First  Trimester of Pregnancy The first trimester of pregnancy is from week 1 until the end of week 12 (months 1 through 3). A week after a sperm fertilizes an egg, the egg will implant on the wall of the uterus. This embryo will begin to develop into a baby. Genes from you and your partner are forming the baby. The female genes determine whether the baby is a boy or a girl. At 6-8 weeks, the eyes and face are formed, and the heartbeat can be seen on ultrasound. At the end of 12 weeks, all the baby's organs are formed.  Now that you are pregnant, you will want to do everything you can to have a healthy baby. Two of the most important things are to get good prenatal care and to follow your health care provider's instructions. Prenatal care is all the medical care you receive before the baby's birth. This care will help prevent, find, and treat any problems during the pregnancy and childbirth. BODY CHANGES Your body goes through many changes during pregnancy. The changes vary from woman to woman.   You may gain or lose a couple of pounds at first.  You may feel sick to your stomach (nauseous) and throw up (vomit). If the vomiting is uncontrollable, call your health care provider.  You may tire easily.  You may develop headaches  that can be relieved by medicines approved by your health care provider.  You may urinate more often. Painful urination may mean you have a bladder infection.  You may develop heartburn as a result of your pregnancy.  You may develop constipation because certain hormones are causing the muscles that push waste through your intestines to slow down.  You may develop hemorrhoids or swollen, bulging veins (varicose veins).  Your breasts may begin to grow larger and become tender. Your nipples may stick out more, and the tissue that surrounds them (areola) may become darker.  Your gums may bleed and may be sensitive to brushing and flossing.  Dark spots or blotches (chloasma, mask  of pregnancy) may develop on your face. This will likely fade after the baby is born.  Your menstrual periods will stop.  You may have a loss of appetite.  You may develop cravings for certain kinds of food.  You may have changes in your emotions from day to day, such as being excited to be pregnant or being concerned that something may go wrong with the pregnancy and baby.  You may have more vivid and strange dreams.  You may have changes in your hair. These can include thickening of your hair, rapid growth, and changes in texture. Some women also have hair loss during or after pregnancy, or hair that feels dry or thin. Your hair will most likely return to normal after your baby is born. WHAT TO EXPECT AT YOUR PRENATAL VISITS During a routine prenatal visit:  You will be weighed to make sure you and the baby are growing normally.  Your blood pressure will be taken.  Your abdomen will be measured to track your baby's growth.  The fetal heartbeat will be listened to starting around week 10 or 12 of your pregnancy.  Test results from any previous visits will be discussed. Your health care provider may ask you:  How you are feeling.  If you are feeling the baby move.  If you have had any abnormal symptoms, such as leaking fluid, bleeding, severe headaches, or abdominal cramping.  If you have any questions. Other tests that may be performed during your first trimester include:  Blood tests to find your blood type and to check for the presence of any previous infections. They will also be used to check for low iron levels (anemia) and Rh antibodies. Later in the pregnancy, blood tests for diabetes will be done along with other tests if problems develop.  Urine tests to check for infections, diabetes, or protein in the urine.  An ultrasound to confirm the proper growth and development of the baby.  An amniocentesis to check for possible genetic problems.  Fetal screens for spina  bifida and Down syndrome.  You may need other tests to make sure you and the baby are doing well. HOME CARE INSTRUCTIONS  Medicines  Follow your health care provider's instructions regarding medicine use. Specific medicines may be either safe or unsafe to take during pregnancy.  Take your prenatal vitamins as directed.  If you develop constipation, try taking a stool softener if your health care provider approves. Diet  Eat regular, well-balanced meals. Choose a variety of foods, such as meat or vegetable-based protein, fish, milk and low-fat dairy products, vegetables, fruits, and whole grain breads and cereals. Your health care provider will help you determine the amount of weight gain that is right for you.  Avoid raw meat and uncooked cheese. These carry germs that can  cause birth defects in the baby.  Eating four or five small meals rather than three large meals a day may help relieve nausea and vomiting. If you start to feel nauseous, eating a few soda crackers can be helpful. Drinking liquids between meals instead of during meals also seems to help nausea and vomiting.  If you develop constipation, eat more high-fiber foods, such as fresh vegetables or fruit and whole grains. Drink enough fluids to keep your urine clear or pale yellow. Activity and Exercise  Exercise only as directed by your health care provider. Exercising will help you:  Control your weight.  Stay in shape.  Be prepared for labor and delivery.  Experiencing pain or cramping in the lower abdomen or low back is a good sign that you should stop exercising. Check with your health care provider before continuing normal exercises.  Try to avoid standing for long periods of time. Move your legs often if you must stand in one place for a long time.  Avoid heavy lifting.  Wear low-heeled shoes, and practice good posture.  You may continue to have sex unless your health care provider directs you otherwise.  Relief of Pain or Discomfort  Wear a good support bra for breast tenderness.   Take warm sitz baths to soothe any pain or discomfort caused by hemorrhoids. Use hemorrhoid cream if your health care provider approves.   Rest with your legs elevated if you have leg cramps or low back pain.  If you develop varicose veins in your legs, wear support hose. Elevate your feet for 15 minutes, 3-4 times a day. Limit salt in your diet. Prenatal Care  Schedule your prenatal visits by the twelfth week of pregnancy. They are usually scheduled monthly at first, then more often in the last 2 months before delivery.  Write down your questions. Take them to your prenatal visits.  Keep all your prenatal visits as directed by your health care provider. Safety  Wear your seat belt at all times when driving.  Make a list of emergency phone numbers, including numbers for family, friends, the hospital, and police and fire departments. General Tips  Ask your health care provider for a referral to a local prenatal education class. Begin classes no later than at the beginning of month 6 of your pregnancy.  Ask for help if you have counseling or nutritional needs during pregnancy. Your health care provider can offer advice or refer you to specialists for help with various needs.  Do not use hot tubs, steam rooms, or saunas.  Do not douche or use tampons or scented sanitary pads.  Do not cross your legs for long periods of time.  Avoid cat litter boxes and soil used by cats. These carry germs that can cause birth defects in the baby and possibly loss of the fetus by miscarriage or stillbirth.  Avoid all smoking, herbs, alcohol, and medicines not prescribed by your health care provider. Chemicals in these affect the formation and growth of the baby.  Schedule a dentist appointment. At home, brush your teeth with a soft toothbrush and be gentle when you floss. SEEK MEDICAL CARE IF:   You have dizziness.   You have mild pelvic cramps, pelvic pressure, or nagging pain in the abdominal area.  You have persistent nausea, vomiting, or diarrhea.  You have a bad smelling vaginal discharge.  You have pain with urination.  You notice increased swelling in your face, hands, legs, or ankles. SEEK IMMEDIATE MEDICAL CARE IF:  You have a fever.  You are leaking fluid from your vagina.  You have spotting or bleeding from your vagina.  You have severe abdominal cramping or pain.  You have rapid weight gain or loss.  You vomit blood or material that looks like coffee grounds.  You are exposed to [place] measles and have never had them.  You are exposed to fifth disease or chickenpox.  You develop a severe headache.  You have shortness of breath.  You have any kind of trauma, such as from a fall or a car accident. Document Released: 02/08/2001 Document Revised: 07/01/2013 Document Reviewed: 12/25/2012 [facility] Patient Information 2015 [place], [place]. This information is not intended to replace advice given to you by your health care provider. Make sure you discuss any questions you have with your health care provider.

## 2018-05-14 NOTE — Progress Notes (Signed)
INITIAL OBSTETRICAL VISIT Patient name: Cheyenne Bauer MRN [id number]  Date of birth: 07-08-1990 Chief Complaint:   Initial Prenatal Visit  History of Present Illness:   Cheyenne Bauer is a 28 y.o. G15P0020 Caucasian female at [redacted]w[redacted]d by LMP c/w 6wk u/s, with an Estimated Date of Delivery: 12/21/18 being seen today for her initial obstetrical visit.   Her obstetrical history is significant for EAB x 1, SAB x 1.   Pt's sister has prothrombin g mutation, had DVTs during pregnancy, pt has never been tested.  N/V 'terrible' at 6 weeks, almost got to point she was going to go to ED, then improved and felt great during week 7. Sunday night n/v returned, has vomited maybe 10x today, last kept solids down yesterday, liquids last night. Still able to make spit, and voiding same/not dark. Has phenergan po that she has found best to split into quarters and take q 2hrs- not able to keep down right now. Also has phenergan suppositories, but she says they cause diarrhea and come right back out. Has lost 8lbs overall, states she has actually gained 3lbs from last week. Does not feel n/v is to the point she needs to go to ED right now for IVF.  Today she reports n/v as above.  Patient's last menstrual period was 03/16/2018 (exact date). Last pap 04/07/17. Results were: normal Review of Systems:   Pertinent items are noted in HPI Denies cramping/contractions, leakage of fluid, vaginal bleeding, abnormal vaginal discharge w/ itching/odor/irritation, headaches, visual changes, shortness of breath, chest pain, abdominal pain, severe nausea/vomiting, or problems with urination or bowel movements unless otherwise stated above.  Pertinent History Reviewed:  Reviewed past medical,surgical, social, obstetrical and family history.  Reviewed problem list, medications and allergies. OB History  Gravida Para Term Preterm AB Living  3       2 0  SAB TAB Ectopic Multiple Live Births  1 1          # Outcome Date GA Lbr Len/2nd  Weight Sex Delivery Anes PTL Lv  3 Current           2 SAB 01/28/18          1 TAB            Physical Assessment:   Vitals:   05/14/18 1555  BP: 123/84  Pulse: (!) 103  Weight: 152 lb (68.9 kg)  Body mass index is 25.29 kg/m.       Physical Examination:  General appearance - well appearing, and in no distress  Mental status - alert, oriented to person, place, and time  Psych:  She has a normal mood and affect  Skin - warm and dry, normal color, no suspicious lesions noted  Chest - effort normal, all lung fields clear to auscultation bilaterally  Heart - normal rate and regular rhythm  Abdomen - soft, nontender  Extremities:  No swelling or varicosities noted  Thin prep pap is not done   Fetal Heart Rate (bpm): 177 u/s via informal transabdominal u/s by amber  Results for orders placed or performed in visit on 05/14/18 (from the past 24 hour(s))  POC Urinalysis Dipstick OB   Collection Time: 05/14/18  4:20 PM  Result Value Ref Range   Color, UA     Clarity, UA     Glucose, UA Negative Negative   Bilirubin, UA     Ketones, UA large    Spec Grav, UA     Blood, UA neg  pH, UA     POC,PROTEIN,UA Negative Negative, Trace, Small (1+), Moderate (2+), Large (3+), 4+   Urobilinogen, UA     Nitrite, UA neg    Leukocytes, UA Negative Negative   Appearance     Odor      Assessment & Plan:  1) Low-Risk Pregnancy G3P0020 at [redacted]w[redacted]d with an Estimated Date of Delivery: 12/21/18   2) Initial OB visit  3) Hyperemesis> will check CMP, add protonix, bonjesta. Reviewed dehydration s/s, reasons to go to [facility] for IVF/IV meds. F/U 1wk  Meds:  Meds ordered this encounter  Medications  . pantoprazole (PROTONIX) 20 MG tablet    Sig: Take 1 tablet (20 mg total) by mouth daily.    Dispense:  30 tablet    Refill:  6    Order Specific Question:   Supervising Provider    Answer:   Despina Hidden, LUTHER H [[id number]]  . Doxylamine-Pyridoxine ER (BONJESTA) 20-20 MG TBCR    Sig: Take 1 tablet by mouth  at bedtime. Can add 1 tablet in the morning if needed for nausea and vomiting    Dispense:  60 tablet    Refill:  8    Order Specific Question:   Supervising Provider    Answer:   Lazaro Arms [[id number]]    Initial labs obtained Continue prenatal vitamins Reviewed n/v relief measures and warning s/s to report Reviewed recommended weight gain based on pre-gravid BMI Encouraged well-balanced diet Genetic Screening discussed: requested NT/IT Cystic fibrosis, SMA, Fragile X screening discussed declined Ultrasound discussed; fetal survey: requested CCNC completed>may look into applying for preg mcaid, PCM not here Flu shot today  Follow-up: Return in about 1 week (around 05/21/2018) for LROB.   Orders Placed This Encounter  Procedures  . GC/Chlamydia Probe Amp  . Urine Culture  . Flu Vaccine QUAD 36+ mos IM  . Obstetric Panel, Including HIV  . Urinalysis, Routine w reflex microscopic  . Sickle cell screen  . Comprehensive metabolic panel  . POC Urinalysis Dipstick OB    Cheral Marker CNM, [facility] 05/14/2018 4:52 PM

## 2018-05-15 ENCOUNTER — Encounter: Payer: Self-pay | Admitting: Women's Health

## 2018-05-15 ENCOUNTER — Other Ambulatory Visit: Payer: Self-pay

## 2018-05-15 ENCOUNTER — Inpatient Hospital Stay (HOSPITAL_COMMUNITY)
Admission: AD | Admit: 2018-05-15 | Discharge: 2018-05-15 | Disposition: A | Discharge status: Home / Self Care | Payer: BLUE CROSS/BLUE SHIELD | Attending: Obstetrics and Gynecology | Admitting: Obstetrics and Gynecology

## 2018-05-15 ENCOUNTER — Encounter (HOSPITAL_COMMUNITY): Payer: Self-pay | Admitting: *Deleted

## 2018-05-15 DIAGNOSIS — O26891 Other specified pregnancy related conditions, first trimester: Secondary | ICD-10-CM | POA: Diagnosis not present

## 2018-05-15 DIAGNOSIS — F17210 Nicotine dependence, cigarettes, uncomplicated: Secondary | ICD-10-CM | POA: Diagnosis not present

## 2018-05-15 DIAGNOSIS — O9989 Other specified diseases and conditions complicating pregnancy, childbirth and the puerperium: Secondary | ICD-10-CM

## 2018-05-15 DIAGNOSIS — R112 Nausea with vomiting, unspecified: Secondary | ICD-10-CM | POA: Diagnosis not present

## 2018-05-15 DIAGNOSIS — E860 Dehydration: Secondary | ICD-10-CM

## 2018-05-15 DIAGNOSIS — N809 Endometriosis, unspecified: Secondary | ICD-10-CM | POA: Diagnosis not present

## 2018-05-15 DIAGNOSIS — Z283 Underimmunization status: Secondary | ICD-10-CM | POA: Insufficient documentation

## 2018-05-15 DIAGNOSIS — Z3A08 8 weeks gestation of pregnancy: Secondary | ICD-10-CM | POA: Diagnosis not present

## 2018-05-15 DIAGNOSIS — Z3481 Encounter for supervision of other normal pregnancy, first trimester: Secondary | ICD-10-CM

## 2018-05-15 DIAGNOSIS — O210 Mild hyperemesis gravidarum: Secondary | ICD-10-CM

## 2018-05-15 DIAGNOSIS — O09899 Supervision of other high risk pregnancies, unspecified trimester: Secondary | ICD-10-CM | POA: Insufficient documentation

## 2018-05-15 DIAGNOSIS — Z79899 Other long term (current) drug therapy: Secondary | ICD-10-CM | POA: Diagnosis not present

## 2018-05-15 DIAGNOSIS — Z2839 Other underimmunization status: Secondary | ICD-10-CM | POA: Insufficient documentation

## 2018-05-15 HISTORY — DX: Proteinuria, unspecified: R80.9

## 2018-05-15 HISTORY — DX: Unspecified asthma, uncomplicated: J45.909

## 2018-05-15 LAB — OBSTETRIC PANEL, INCLUDING HIV
ANTIBODY SCREEN: NEGATIVE
Basophils Absolute: 0 10*3/uL (ref 0.0–0.2)
Basos: 1 %
EOS (ABSOLUTE): 0 10*3/uL (ref 0.0–0.4)
Eos: 0 %
HEMATOCRIT: 41.1 % (ref 34.0–46.6)
HIV Screen 4th Generation wRfx: NONREACTIVE
Hemoglobin: 13.9 g/dL (ref 11.1–15.9)
Hepatitis B Surface Ag: NEGATIVE
IMMATURE GRANS (ABS): 0 10*3/uL (ref 0.0–0.1)
Immature Granulocytes: 0 %
LYMPHS: 18 %
Lymphocytes Absolute: 1.5 10*3/uL (ref 0.7–3.1)
MCH: 29.8 pg (ref 26.6–33.0)
MCHC: 33.8 g/dL (ref 31.5–35.7)
MCV: 88 fL (ref 79–97)
Monocytes Absolute: 0.4 10*3/uL (ref 0.1–0.9)
Monocytes: 5 %
Neutrophils Absolute: 6.4 10*3/uL (ref 1.4–7.0)
Neutrophils: 76 %
Platelets: 374 10*3/uL (ref 150–450)
RBC: 4.66 x10E6/uL (ref 3.77–5.28)
RDW: 12 % (ref 11.7–15.4)
RPR Ser Ql: NONREACTIVE
Rh Factor: POSITIVE
Rubella Antibodies, IGG: <0.9 index — ABNORMAL LOW (ref >0.99)
WBC: 8.4 10*3/uL (ref 3.4–10.8)

## 2018-05-15 LAB — URINALYSIS, ROUTINE W REFLEX MICROSCOPIC
Bilirubin Urine: NEGATIVE
Bilirubin, UA: NEGATIVE
Glucose, UA: NEGATIVE
Glucose, UA: NEGATIVE mg/dL
Hgb urine dipstick: NEGATIVE
Ketones, ur: 80 mg/dL — AB
Leukocytes, UA: NEGATIVE
Leukocytes,Ua: NEGATIVE
Nitrite, UA: NEGATIVE
Nitrite: NEGATIVE
Protein, ur: 30 mg/dL — AB
RBC, UA: NEGATIVE
Specific Gravity, UA: >=1.03 — AB (ref 1.005–1.030)
Specific Gravity, Urine: 1.029 (ref 1.005–1.030)
Urobilinogen, Ur: 0.2 mg/dL (ref 0.2–1.0)
pH, UA: 6 (ref 5.0–7.5)
pH: 5 (ref 5.0–8.0)

## 2018-05-15 LAB — COMPREHENSIVE METABOLIC PANEL
ALT: 13 IU/L (ref 0–32)
ALT: 20 U/L (ref 0–44)
AST: 15 IU/L (ref 0–40)
AST: 15 U/L (ref 15–41)
Albumin/Globulin Ratio: 1.9 (ref 1.2–2.2)
Albumin: 4.6 g/dL (ref 3.9–5.0)
Albumin: 4 g/dL (ref 3.5–5.0)
Alkaline Phosphatase: 61 IU/L (ref 39–117)
Alkaline Phosphatase: 51 U/L (ref 38–126)
Anion gap: 11 (ref 5–15)
BUN/Creatinine Ratio: 12 (ref 9–23)
BUN: 8 mg/dL (ref 6–20)
BUN: 10 mg/dL (ref 6–20)
Bilirubin Total: 0.4 mg/dL (ref 0.0–1.2)
CALCIUM: 10.1 mg/dL (ref 8.7–10.2)
CO2: 21 mmol/L (ref 20–29)
CO2: 21 mmol/L — ABNORMAL LOW (ref 22–32)
Calcium: 9.7 mg/dL (ref 8.9–10.3)
Chloride: 101 mmol/L (ref 96–106)
Chloride: 103 mmol/L (ref 98–111)
Creatinine, Ser: 0.66 mg/dL (ref 0.57–1.00)
Creatinine, Ser: 0.64 mg/dL (ref 0.44–1.00)
GFR calc Af Amer: 141 mL/min/{1.73_m2} (ref >59)
GFR calc Af Amer: >60 mL/min (ref >60)
GFR calc non Af Amer: >60 mL/min (ref >60)
GFR, EST NON AFRICAN AMERICAN: 122 mL/min/{1.73_m2} (ref >59)
GLUCOSE: 95 mg/dL (ref 70–99)
Globulin, Total: 2.4 g/dL (ref 1.5–4.5)
Glucose: 94 mg/dL (ref 65–99)
Potassium: 4.5 mmol/L (ref 3.5–5.2)
Potassium: 4.2 mmol/L (ref 3.5–5.1)
Sodium: 137 mmol/L (ref 134–144)
Sodium: 135 mmol/L (ref 135–145)
Total Bilirubin: 0.7 mg/dL (ref 0.3–1.2)
Total Protein: 7 g/dL (ref 6.0–8.5)
Total Protein: 7.1 g/dL (ref 6.5–8.1)

## 2018-05-15 LAB — CBC
HEMATOCRIT: 38.7 % (ref 36.0–46.0)
Hemoglobin: 13.5 g/dL (ref 12.0–15.0)
MCH: 29.9 pg (ref 26.0–34.0)
MCHC: 34.9 g/dL (ref 30.0–36.0)
MCV: 85.8 fL (ref 80.0–100.0)
Platelets: 306 10*3/uL (ref 150–400)
RBC: 4.51 MIL/uL (ref 3.87–5.11)
RDW: 11.6 % (ref 11.5–15.5)
WBC: 8.1 10*3/uL (ref 4.0–10.5)
nRBC: 0 % (ref 0.0–0.2)

## 2018-05-15 LAB — SICKLE CELL SCREEN: Sickle Cell Screen: NEGATIVE

## 2018-05-15 MED ORDER — M.V.I. ADULT IV INJ
Freq: Once | INTRAVENOUS | Status: AC
  Administered 2018-05-15: 16:00:00 via INTRAVENOUS
  Filled 2018-05-15: qty 5

## 2018-05-15 MED ORDER — LACTATED RINGERS IV BOLUS
1000.0000 mL | Freq: Once | INTRAVENOUS | Status: AC
  Administered 2018-05-15: 1000 mL via INTRAVENOUS

## 2018-05-15 MED ORDER — METOCLOPRAMIDE HCL 10 MG PO TABS: 10.0000 mg | ORAL_TABLET | Freq: Three times a day (TID) | ORAL | 1 refills | Status: DC | PRN

## 2018-05-15 MED ORDER — METOCLOPRAMIDE HCL 5 MG/ML IJ SOLN
10.0000 mg | Freq: Once | INTRAMUSCULAR | Status: AC
  Administered 2018-05-15: 10 mg via INTRAVENOUS
  Filled 2018-05-15: qty 2

## 2018-05-15 MED ORDER — SCOPOLAMINE 1 MG/3DAYS TD PT72: 1.0000 | MEDICATED_PATCH | TRANSDERMAL | 1 refills | Status: DC

## 2018-05-15 MED ORDER — FAMOTIDINE 20 MG IN NS 100 ML IVPB
20.0000 mg | Freq: Once | INTRAVENOUS | Status: AC
  Administered 2018-05-15: 20 mg via INTRAVENOUS
  Filled 2018-05-15: qty 100

## 2018-05-15 MED ORDER — SODIUM CHLORIDE 0.9 % IV SOLN
8.0000 mg | Freq: Once | INTRAVENOUS | Status: AC
  Administered 2018-05-15: 8 mg via INTRAVENOUS
  Filled 2018-05-15: qty 4

## 2018-05-15 MED ORDER — SCOPOLAMINE 1 MG/3DAYS TD PT72
1.0000 | MEDICATED_PATCH | TRANSDERMAL | Status: DC
  Administered 2018-05-15: 1.5 mg via TRANSDERMAL
  Filled 2018-05-15: qty 1

## 2018-05-15 NOTE — [facility] Note (Signed)
Presents with c/o N/V.  Reports unable keep anything down since Saturday.  Reports vomited too many times too count. States has voided once today.

## 2018-05-15 NOTE — Discharge Instructions (Signed)
Morning Sickness    Morning sickness is when you feel sick to your stomach (nauseous) during pregnancy. You may feel sick to your stomach and throw up (vomit). You may feel sick in the morning, but you can feel this way at any time of day. Some women feel very sick to their stomach and cannot stop throwing up (hyperemesis gravidarum).  Follow these instructions at home:  Medicines   Take over-the-counter and prescription medicines only as told by your doctor. Do not take any medicines until you talk with your doctor about them first.   Taking multivitamins before getting pregnant can stop or lessen the harshness of morning sickness.  Eating and drinking   Eat dry toast or crackers before getting out of bed.   Eat 5 or 6 small meals a day.   Eat dry and bland foods like rice and baked potatoes.   Do not eat greasy, fatty, or spicy foods.   Have someone cook for you if the smell of food causes you to feel sick or throw up.   If you feel sick to your stomach after taking prenatal vitamins, take them at night or with a snack.   Eat protein when you need a snack. Nuts, yogurt, and cheese are good choices.   Drink fluids throughout the day.   Try ginger ale made with real ginger, ginger tea made from fresh grated ginger, or ginger candies.  General instructions   Do not use any products that have nicotine or tobacco in them, such as cigarettes and e-cigarettes. If you need help quitting, ask your doctor.   Use an air purifier to keep the air in your house free of smells.   Get lots of fresh air.   Try to avoid smells that make you feel sick.   Try:  ? Wearing a bracelet that is used for seasickness (acupressure wristband).  ? Going to a doctor who puts thin needles into certain body points (acupuncture) to improve how you feel.  Contact a doctor if:   You need medicine to feel better.   You feel dizzy or light-headed.   You are losing weight.  Get help right away if:   You feel very sick to your  stomach and cannot stop throwing up.   You pass out (faint).   You have very bad pain in your belly.  Summary   Morning sickness is when you feel sick to your stomach (nauseous) during pregnancy.   You may feel sick in the morning, but you can feel this way at any time of day.   Making some changes to what you eat may help your symptoms go away.  This information is not intended to replace advice given to you by your health care provider. Make sure you discuss any questions you have with your health care provider.  Document Released: 03/24/2004 Document Revised: 03/17/2016 Document Reviewed: 03/17/2016  [employer] Interactive Patient Education  2019 [employer]
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
## 2018-05-16 LAB — URINE CULTURE

## 2018-05-16 LAB — GC/CHLAMYDIA PROBE AMP
Chlamydia trachomatis, NAA: NEGATIVE
Neisseria gonorrhoeae by PCR: NEGATIVE

## 2018-05-21 ENCOUNTER — Ambulatory Visit (INDEPENDENT_AMBULATORY_CARE_PROVIDER_SITE_OTHER): Payer: BLUE CROSS/BLUE SHIELD | Admitting: Women's Health

## 2018-05-21 ENCOUNTER — Other Ambulatory Visit: Payer: Self-pay

## 2018-05-21 ENCOUNTER — Encounter: Payer: Self-pay | Admitting: Women's Health

## 2018-05-21 VITALS — BP 134/77 | HR 90 | Wt 153.0 lb

## 2018-05-21 DIAGNOSIS — Z3A09 9 weeks gestation of pregnancy: Secondary | ICD-10-CM

## 2018-05-21 DIAGNOSIS — Z3682 Encounter for antenatal screening for nuchal translucency: Secondary | ICD-10-CM

## 2018-05-21 DIAGNOSIS — Z1389 Encounter for screening for other disorder: Secondary | ICD-10-CM

## 2018-05-21 DIAGNOSIS — R1110 Vomiting, unspecified: Secondary | ICD-10-CM

## 2018-05-21 DIAGNOSIS — Z331 Pregnant state, incidental: Secondary | ICD-10-CM

## 2018-05-21 DIAGNOSIS — Z3481 Encounter for supervision of other normal pregnancy, first trimester: Secondary | ICD-10-CM

## 2018-05-21 LAB — POCT URINALYSIS DIPSTICK OB
Blood, UA: NEGATIVE
Glucose, UA: NEGATIVE
Ketones, UA: NEGATIVE
Leukocytes, UA: NEGATIVE
Nitrite, UA: NEGATIVE
POC,PROTEIN,UA: NEGATIVE

## 2018-05-21 NOTE — Patient Instructions (Signed)
Cheyenne Bauer, I greatly value your feedback.  If you receive a survey following your visit with [place] today, we appreciate you taking the time to fill it out.  Thanks, Joellyn Haff, CNM, [facility]  [facility] H[facility]PITAL HAS MOVED!!! It is now [facility] ([address] [place], [place] [address]) Entrance located off of [address] Free 24/7 valet parking    Nausea & Vomiting  Have saltine crackers or pretzels by your bed and eat a few bites before you raise your head out of bed in the morning  Eat small frequent meals throughout the day instead of large meals  Drink plenty of fluids throughout the day to stay hydrated, just don't drink a lot of fluids with your meals.  This can make your stomach fill up faster making you feel sick  Do not brush your teeth right after you eat  Products with real ginger are good for nausea, like ginger ale and ginger hard candy Make sure it says made with real ginger!  Sucking on sour candy like lemon heads is also good for nausea  If your prenatal vitamins make you nauseated, take them at night so you will sleep through the nausea  Sea Bands  If you feel like you need medicine for the nausea & vomiting please let us know  If you are unable to keep any fluids or food down please let us know   Constipation  Drink plenty of fluid, preferably water, throughout the day  Eat foods high in fiber such as fruits, vegetables, and grains  Exercise, such as walking, is a good way to keep your bowels regular  Drink warm fluids, especially warm prune juice, or decaf coffee  Eat a 1/2 cup of real oatmeal (not instant), 1/2 cup applesauce, and 1/2-1 cup warm prune juice every day  If needed, you may take Colace (docusate sodium) stool softener once or twice a day to help keep the stool soft. If you are pregnant, wait until you are out of your first trimester (12-14 weeks of pregnancy)  If you still are having problems with  constipation, you may take Miralax once daily as needed to help keep your bowels regular.  If you are pregnant, wait until you are out of your first trimester (12-14 weeks of pregnancy)   First Trimester of Pregnancy The first trimester of pregnancy is from week 1 until the end of week 12 (months 1 through 3). A week after a sperm fertilizes an egg, the egg will implant on the wall of the uterus. This embryo will begin to develop into a baby. Genes from you and your partner are forming the baby. The female genes determine whether the baby is a boy or a girl. At 6-8 weeks, the eyes and face are formed, and the heartbeat can be seen on ultrasound. At the end of 12 weeks, all the baby's organs are formed.  Now that you are pregnant, you will want to do everything you can to have a healthy baby. Two of the most important things are to get good prenatal care and to follow your health care provider's instructions. Prenatal care is all the medical care you receive before the baby's birth. This care will help prevent, find, and treat any problems during the pregnancy and childbirth. BODY CHANGES Your body goes through many changes during pregnancy. The changes vary from woman to woman.   You may gain or lose a couple of pounds at  first.  You may feel sick to your stomach (nauseous) and throw up (vomit). If the vomiting is uncontrollable, call your health care provider.  You may tire easily.  You may develop headaches that can be relieved by medicines approved by your health care provider.  You may urinate more often. Painful urination may mean you have a bladder infection.  You may develop heartburn as a result of your pregnancy.  You may develop constipation because certain hormones are causing the muscles that push waste through your intestines to slow down.  You may develop hemorrhoids or swollen, bulging veins (varicose veins).  Your breasts may begin to grow larger and become tender. Your nipples  may stick out more, and the tissue that surrounds them (areola) may become darker.  Your gums may bleed and may be sensitive to brushing and flossing.  Dark spots or blotches (chloasma, mask of pregnancy) may develop on your face. This will likely fade after the baby is born.  Your menstrual periods will stop.  You may have a loss of appetite.  You may develop cravings for certain kinds of food.  You may have changes in your emotions from day to day, such as being excited to be pregnant or being concerned that something may go wrong with the pregnancy and baby.  You may have more vivid and strange dreams.  You may have changes in your hair. These can include thickening of your hair, rapid growth, and changes in texture. Some women also have hair loss during or after pregnancy, or hair that feels dry or thin. Your hair will most likely return to normal after your baby is born. WHAT TO EXPECT AT YOUR PRENATAL VISITS During a routine prenatal visit:  You will be weighed to make sure you and the baby are growing normally.  Your blood pressure will be taken.  Your abdomen will be measured to track your baby's growth.  The fetal heartbeat will be listened to starting around week 10 or 12 of your pregnancy.  Test results from any previous visits will be discussed. Your health care provider may ask you:  How you are feeling.  If you are feeling the baby move.  If you have had any abnormal symptoms, such as leaking fluid, bleeding, severe headaches, or abdominal cramping.  If you have any questions. Other tests that may be performed during your first trimester include:  Blood tests to find your blood type and to check for the presence of any previous infections. They will also be used to check for low iron levels (anemia) and Rh antibodies. Later in the pregnancy, blood tests for diabetes will be done along with other tests if problems develop.  Urine tests to check for infections,  diabetes, or protein in the urine.  An ultrasound to confirm the proper growth and development of the baby.  An amniocentesis to check for possible genetic problems.  Fetal screens for spina bifida and Down syndrome.  You may need other tests to make sure you and the baby are doing well. HOME CARE INSTRUCTIONS  Medicines  Follow your health care provider's instructions regarding medicine use. Specific medicines may be either safe or unsafe to take during pregnancy.  Take your prenatal vitamins as directed.  If you develop constipation, try taking a stool softener if your health care provider approves. Diet  Eat regular, well-balanced meals. Choose a variety of foods, such as meat or vegetable-based protein, fish, milk and low-fat dairy products, vegetables, fruits, and whole  grain breads and cereals. Your health care provider will help you determine the amount of weight gain that is right for you.  Avoid raw meat and uncooked cheese. These carry germs that can cause birth defects in the baby.  Eating four or five small meals rather than three large meals a day may help relieve nausea and vomiting. If you start to feel nauseous, eating a few soda crackers can be helpful. Drinking liquids between meals instead of during meals also seems to help nausea and vomiting.  If you develop constipation, eat more high-fiber foods, such as fresh vegetables or fruit and whole grains. Drink enough fluids to keep your urine clear or pale yellow. Activity and Exercise  Exercise only as directed by your health care provider. Exercising will help you:  Control your weight.  Stay in shape.  Be prepared for labor and delivery.  Experiencing pain or cramping in the lower abdomen or low back is a good sign that you should stop exercising. Check with your health care provider before continuing normal exercises.  Try to avoid standing for long periods of time. Move your legs often if you must stand in  one place for a long time.  Avoid heavy lifting.  Wear low-heeled shoes, and practice good posture.  You may continue to have sex unless your health care provider directs you otherwise. Relief of Pain or Discomfort  Wear a good support bra for breast tenderness.    Take warm sitz baths to soothe any pain or discomfort caused by hemorrhoids. Use hemorrhoid cream if your health care provider approves.    Rest with your legs elevated if you have leg cramps or low back pain.  If you develop varicose veins in your legs, wear support hose. Elevate your feet for 15 minutes, 3-4 times a day. Limit salt in your diet. Prenatal Care  Schedule your prenatal visits by the twelfth week of pregnancy. They are usually scheduled monthly at first, then more often in the last 2 months before delivery.  Write down your questions. Take them to your prenatal visits.  Keep all your prenatal visits as directed by your health care provider. Safety  Wear your seat belt at all times when driving.  Make a list of emergency phone numbers, including numbers for family, friends, the hospital, and police and fire departments. General Tips  Ask your health care provider for a referral to a local prenatal education class. Begin classes no later than at the beginning of month 6 of your pregnancy.  Ask for help if you have counseling or nutritional needs during pregnancy. Your health care provider can offer advice or refer you to specialists for help with various needs.  Do not use hot tubs, steam rooms, or saunas.  Do not douche or use tampons or scented sanitary pads.  Do not cross your legs for long periods of time.  Avoid cat litter boxes and soil used by cats. These carry germs that can cause birth defects in the baby and possibly loss of the fetus by miscarriage or stillbirth.  Avoid all smoking, herbs, alcohol, and medicines not prescribed by your health care provider. Chemicals in these affect the  formation and growth of the baby.  Schedule a dentist appointment. At home, brush your teeth with a soft toothbrush and be gentle when you floss. SEEK MEDICAL CARE IF:   You have dizziness.  You have mild pelvic cramps, pelvic pressure, or nagging pain in the abdominal area.  You have  persistent nausea, vomiting, or diarrhea.  You have a bad smelling vaginal discharge.  You have pain with urination.  You notice increased swelling in your face, hands, legs, or ankles. SEEK IMMEDIATE MEDICAL CARE IF:   You have a fever.  You are leaking fluid from your vagina.  You have spotting or bleeding from your vagina.  You have severe abdominal cramping or pain.  You have rapid weight gain or loss.  You vomit blood or material that looks like coffee grounds.  You are exposed to [place] measles and have never had them.  You are exposed to fifth disease or chickenpox.  You develop a severe headache.  You have shortness of breath.  You have any kind of trauma, such as from a fall or a car accident. Document Released: 02/08/2001 Document Revised: 07/01/2013 Document Reviewed: 12/25/2012 [facility] Patient Information 2015 [place], [place]. This information is not intended to replace advice given to you by your health care provider. Make sure you discuss any questions you have with your health care provider.   Coronavirus (COVID-19) Are you at risk?  Are you at risk for the Coronavirus (COVID-19)?  To be considered HIGH RISK for Coronavirus (COVID-19), you have to meet the following criteria:  . Traveled to [place], [place], [place], [place] or [place]; or in the [place] to [place], [place], [place], or [place]; and have fever, cough, and shortness of breath within the last 2 weeks of travel OR . Been in close contact with a person diagnosed with COVID-19 within the last 2 weeks and have fever, cough, and shortness of breath . IF YOU DO NOT MEET THESE CRITERIA, YOU ARE  CONSIDERED LOW RISK FOR COVID-19.  What to do if you are HIGH RISK for COVID-19?  Marland Kitchen If you are having a medical emergency, call 911. . Seek medical care right away. Before you go to a doctor's office, urgent care or emergency department, call ahead and tell them about your recent travel, contact with someone diagnosed with COVID-19, and your symptoms. You should receive instructions from your physician's office regarding next steps of care.  . When you arrive at healthcare provider, tell the healthcare staff immediately you have returned from visiting [place], [place], [place], [place] or [place]; or traveled in the [place] to [place], [place], [place], or [place]; in the last two weeks or you have been in close contact with a person diagnosed with COVID-19 in the last 2 weeks.   . Tell the health care staff about your symptoms: fever, cough and shortness of breath. . After you have been seen by a medical provider, you will be either: o Tested for (COVID-19) and discharged home on quarantine except to seek medical care if symptoms worsen, and asked to  - Stay home and avoid contact with others until you get your results (4-5 days)  - Avoid travel on public transportation if possible (such as bus, train, or airplane) or o Sent to the Emergency Department by EMS for evaluation, COVID-19 testing, and possible admission depending on your condition and test results.  What to do if you are LOW RISK for COVID-19?  Reduce your risk of any infection by using the same precautions used for avoiding the common cold or flu:  Marland Kitchen Wash your hands often with soap and warm water for at least 20 seconds.  If soap and water are not readily available, use an alcohol-based hand sanitizer with at least 60% alcohol.  Marland Kitchen  If coughing or sneezing, cover your mouth and nose by coughing or sneezing into the elbow areas of your shirt or coat, into a tissue or into your sleeve (not your hands). . Avoid shaking hands  with others and consider head nods or verbal greetings only. . Avoid touching your eyes, nose, or mouth with unwashed hands.  . Avoid close contact with people who are sick. . Avoid places or events with large numbers of people in one location, like concerts or sporting events. . Carefully consider travel plans you have or are making. . If you are planning any travel outside or inside the [place], visit the CDC's Travelers' Health webpage for the latest health notices. . If you have some symptoms but not all symptoms, continue to monitor at home and seek medical attention if your symptoms worsen. . If you are having a medical emergency, call 911.   ADDITIONAL HEALTHCARE OPTIONS FOR PATIENTS  [facility] / e-Visit: [email or web address]         [facility]: [phone]  [facility]: [phone]                   [facility]: [phone]

## 2018-05-21 NOTE — Progress Notes (Addendum)
   LOW-RISK PREGNANCY VISIT Patient name: Cheyenne Bauer MRN [id number]  Date of birth: Mar 03, 1990 Chief Complaint:   Routine Prenatal Visit  History of Present Illness:   JLEE NYDEGGER is a 28 y.o. G22P0020 female at [redacted]w[redacted]d with an Estimated Date of Delivery: 12/21/18 being seen today for ongoing management of a low-risk pregnancy.  Today she reports feeling better. Went to [facility] last week b/c n/v worsened. Rx'd scopolamine patch, reglan. N/V improved for few days, then worsened again. Got Bonjesta in yesterday, and feeling much better today. Contractions: Not present. Vag. Bleeding: None.  Movement: Absent. denies leaking of fluid. Review of Systems:   Pertinent items are noted in HPI Denies abnormal vaginal discharge w/ itching/odor/irritation, headaches, visual changes, shortness of breath, chest pain, abdominal pain, severe nausea/vomiting, or problems with urination or bowel movements unless otherwise stated above. Pertinent History Reviewed:  Reviewed past medical,surgical, social, obstetrical and family history.  Reviewed problem list, medications and allergies. Physical Assessment:   Vitals:   05/21/18 1604  BP: 134/77  Pulse: 90  Weight: 153 lb (69.4 kg)  Body mass index is 24.69 kg/m.        Physical Examination:   General appearance: Well appearing, and in no distress  Mental status: Alert, oriented to person, place, and time  Skin: Warm & dry  Cardiovascular: Normal heart rate noted  Respiratory: Normal respiratory effort, no distress  Abdomen: Soft, gravid, nontender  Pelvic: Cervical exam deferred         Extremities: Edema: None  Fetal Status: Fetal Heart Rate (bpm): +u/s   Movement: Absent    Results for orders placed or performed in visit on 05/21/18 (from the past 24 hour(s))  POC Urinalysis Dipstick OB   Collection Time: 05/21/18  4:08 PM  Result Value Ref Range   Color, UA     Clarity, UA     Glucose, UA Negative Negative   Bilirubin, UA     Ketones, UA neg     Spec Grav, UA     Blood, UA neg    pH, UA     POC,PROTEIN,UA Negative Negative, Trace, Small (1+), Moderate (2+), Large (3+), 4+   Urobilinogen, UA     Nitrite, UA neg    Leukocytes, UA Negative Negative   Appearance     Odor      Assessment & Plan:  1) Low-risk pregnancy G3P0020 at [redacted]w[redacted]d with an Estimated Date of Delivery: 12/21/18   2) Hyperemesis, currently improved, has gained 1lb. Continue Bonjesta, reglan, scopolamine patch, phenergan prn. Discussed dehydration s/s, reasons to seek care   Meds: No orders of the defined types were placed in this encounter.  Labs/procedures today: none  Plan:  Continue routine obstetrical care   Reviewed: Preterm labor symptoms and general obstetric precautions including but not limited to vaginal bleeding, contractions, leaking of fluid and fetal movement were reviewed in detail with the patient.  All questions were answered  Follow-up: Return in about 4 weeks (around 06/18/2018) for LROB, US:NT+1stIT.  Orders Placed This Encounter  Procedures  . US Fetal Nuchal Translucency Measurement  . POC Urinalysis Dipstick OB   Cheral Marker CNM, [facility] 05/21/2018 4:57 PM

## 2018-06-15 ENCOUNTER — Encounter: Payer: Self-pay | Admitting: *Deleted

## 2018-06-18 ENCOUNTER — Ambulatory Visit (INDEPENDENT_AMBULATORY_CARE_PROVIDER_SITE_OTHER): Payer: BLUE CROSS/BLUE SHIELD

## 2018-06-18 ENCOUNTER — Other Ambulatory Visit: Payer: Self-pay

## 2018-06-18 ENCOUNTER — Ambulatory Visit (INDEPENDENT_AMBULATORY_CARE_PROVIDER_SITE_OTHER): Payer: BLUE CROSS/BLUE SHIELD | Admitting: Advanced Practice Midwife

## 2018-06-18 ENCOUNTER — Encounter: Payer: Self-pay | Admitting: Advanced Practice Midwife

## 2018-06-18 VITALS — BP 117/66 | HR 98 | Temp 98.5°F | Wt 156.4 lb

## 2018-06-18 DIAGNOSIS — Z363 Encounter for antenatal screening for malformations: Secondary | ICD-10-CM

## 2018-06-18 DIAGNOSIS — Z1379 Encounter for other screening for genetic and chromosomal anomalies: Secondary | ICD-10-CM | POA: Diagnosis not present

## 2018-06-18 DIAGNOSIS — Z331 Pregnant state, incidental: Secondary | ICD-10-CM

## 2018-06-18 DIAGNOSIS — Z3682 Encounter for antenatal screening for nuchal translucency: Secondary | ICD-10-CM | POA: Diagnosis not present

## 2018-06-18 DIAGNOSIS — Z3A13 13 weeks gestation of pregnancy: Secondary | ICD-10-CM | POA: Diagnosis not present

## 2018-06-18 DIAGNOSIS — Z3481 Encounter for supervision of other normal pregnancy, first trimester: Secondary | ICD-10-CM

## 2018-06-18 DIAGNOSIS — Z1389 Encounter for screening for other disorder: Secondary | ICD-10-CM

## 2018-06-18 LAB — POCT URINALYSIS DIPSTICK OB
Blood, UA: NEGATIVE
Glucose, UA: NEGATIVE
Ketones, UA: NEGATIVE
Leukocytes, UA: NEGATIVE
Nitrite, UA: NEGATIVE
POC,PROTEIN,UA: NEGATIVE

## 2018-06-18 NOTE — Patient Instructions (Signed)
Cheyenne Bauer, I greatly value your feedback.  If you receive a survey following your visit with [place] today, we appreciate you taking the time to fill it out.  Thanks, Cathie Beams, CNM     Second Trimester of Pregnancy The second trimester is from week 14 through week 27 (months 4 through 6). The second trimester is often a time when you feel your best. Your body has adjusted to being pregnant, and you begin to feel better physically. Usually, morning sickness has lessened or quit completely, you may have more energy, and you may have an increase in appetite. The second trimester is also a time when the fetus is growing rapidly. At the end of the sixth month, the fetus is about 9 inches long and weighs about 1 pounds. You will likely begin to feel the baby move (quickening) between 16 and 20 weeks of pregnancy. Body changes during your second trimester Your body continues to go through many changes during your second trimester. The changes vary from woman to woman.  Your weight will continue to increase. You will notice your lower abdomen bulging out.  You may begin to get stretch marks on your hips, abdomen, and breasts.  You may develop headaches that can be relieved by medicines. The medicines should be approved by your health care provider.  You may urinate more often because the fetus is pressing on your bladder.  You may develop or continue to have heartburn as a result of your pregnancy.  You may develop constipation because certain hormones are causing the muscles that push waste through your intestines to slow down.  You may develop hemorrhoids or swollen, bulging veins (varicose veins).  You may have back pain. This is caused by: ? Weight gain. ? Pregnancy hormones that are relaxing the joints in your pelvis. ? A shift in weight and the muscles that support your balance.  Your breasts will continue to grow and they will continue to become tender.  Your gums may  bleed and may be sensitive to brushing and flossing.  Dark spots or blotches (chloasma, mask of pregnancy) may develop on your face. This will likely fade after the baby is born.  A dark line from your belly button to the pubic area (linea nigra) may appear. This will likely fade after the baby is born.  You may have changes in your hair. These can include thickening of your hair, rapid growth, and changes in texture. Some women also have hair loss during or after pregnancy, or hair that feels dry or thin. Your hair will most likely return to normal after your baby is born.  What to expect at prenatal visits During a routine prenatal visit:  You will be weighed to make sure you and the fetus are growing normally.  Your blood pressure will be taken.  Your abdomen will be measured to track your baby's growth.  The fetal heartbeat will be listened to.  Any test results from the previous visit will be discussed.  Your health care provider may ask you:  How you are feeling.  If you are feeling the baby move.  If you have had any abnormal symptoms, such as leaking fluid, bleeding, severe headaches, or abdominal cramping.  If you are using any tobacco products, including cigarettes, chewing tobacco, and electronic cigarettes.  If you have any questions.  Other tests that may be performed during your second trimester include:  Blood tests that check for: ? Low iron levels (anemia). ?  High blood sugar that affects pregnant women (gestational diabetes) between 73 and 28 weeks. ? Rh antibodies. This is to check for a protein on red blood cells (Rh factor).  Urine tests to check for infections, diabetes, or protein in the urine.  An ultrasound to confirm the proper growth and development of the baby.  An amniocentesis to check for possible genetic problems.  Fetal screens for spina bifida and Down syndrome.  HIV (human immunodeficiency virus) testing. Routine prenatal testing  includes screening for HIV, unless you choose not to have this test.  Follow these instructions at home: Medicines  Follow your health care provider's instructions regarding medicine use. Specific medicines may be either safe or unsafe to take during pregnancy.  Take a prenatal vitamin that contains at least 600 micrograms (mcg) of folic acid.  If you develop constipation, try taking a stool softener if your health care provider approves. Eating and drinking  Eat a balanced diet that includes fresh fruits and vegetables, whole grains, good sources of protein such as meat, eggs, or tofu, and low-fat dairy. Your health care provider will help you determine the amount of weight gain that is right for you.  Avoid raw meat and uncooked cheese. These carry germs that can cause birth defects in the baby.  If you have low calcium intake from food, talk to your health care provider about whether you should take a daily calcium supplement.  Limit foods that are high in fat and processed sugars, such as fried and sweet foods.  To prevent constipation: ? Drink enough fluid to keep your urine clear or pale yellow. ? Eat foods that are high in fiber, such as fresh fruits and vegetables, whole grains, and beans. Activity  Exercise only as directed by your health care provider. Most women can continue their usual exercise routine during pregnancy. Try to exercise for 30 minutes at least 5 days a week. Stop exercising if you experience uterine contractions.  Avoid heavy lifting, wear low heel shoes, and practice good posture.  A sexual relationship may be continued unless your health care provider directs you otherwise. Relieving pain and discomfort  Wear a good support bra to prevent discomfort from breast tenderness.  Take warm sitz baths to soothe any pain or discomfort caused by hemorrhoids. Use hemorrhoid cream if your health care provider approves.  Rest with your legs elevated if you have  leg cramps or low back pain.  If you develop varicose veins, wear support hose. Elevate your feet for 15 minutes, 3-4 times a day. Limit salt in your diet. Prenatal Care  Write down your questions. Take them to your prenatal visits.  Keep all your prenatal visits as told by your health care provider. This is important. Safety  Wear your seat belt at all times when driving.  Make a list of emergency phone numbers, including numbers for family, friends, the hospital, and police and fire departments. General instructions  Ask your health care provider for a referral to a local prenatal education class. Begin classes no later than the beginning of month 6 of your pregnancy.  Ask for help if you have counseling or nutritional needs during pregnancy. Your health care provider can offer advice or refer you to specialists for help with various needs.  Do not use hot tubs, steam rooms, or saunas.  Do not douche or use tampons or scented sanitary pads.  Do not cross your legs for long periods of time.  Avoid cat litter boxes and  soil used by cats. These carry germs that can cause birth defects in the baby and possibly loss of the fetus by miscarriage or stillbirth.  Avoid all smoking, herbs, alcohol, and unprescribed drugs. Chemicals in these products can affect the formation and growth of the baby.  Do not use any products that contain nicotine or tobacco, such as cigarettes and e-cigarettes. If you need help quitting, ask your health care provider.  Visit your dentist if you have not gone yet during your pregnancy. Use a soft toothbrush to brush your teeth and be gentle when you floss. Contact a health care provider if:  You have dizziness.  You have mild pelvic cramps, pelvic pressure, or nagging pain in the abdominal area.  You have persistent nausea, vomiting, or diarrhea.  You have a bad smelling vaginal discharge.  You have pain when you urinate. Get help right away if:  You  have a fever.  You are leaking fluid from your vagina.  You have spotting or bleeding from your vagina.  You have severe abdominal cramping or pain.  You have rapid weight gain or weight loss.  You have shortness of breath with chest pain.  You notice sudden or extreme swelling of your face, hands, ankles, feet, or legs.  You have not felt your baby move in over an hour.  You have severe headaches that do not go away when you take medicine.  You have vision changes. Summary  The second trimester is from week 14 through week 27 (months 4 through 6). It is also a time when the fetus is growing rapidly.  Your body goes through many changes during pregnancy. The changes vary from woman to woman.  Avoid all smoking, herbs, alcohol, and unprescribed drugs. These chemicals affect the formation and growth your baby.  Do not use any tobacco products, such as cigarettes, chewing tobacco, and e-cigarettes. If you need help quitting, ask your health care provider.  Contact your health care provider if you have any questions. Keep all prenatal visits as told by your health care provider. This is important. This information is not intended to replace advice given to you by your health care provider. Make sure you discuss any questions you have with your health care provider.      CHILDBIRTH CLASSES [phone] is the phone number for Pregnancy Classes or hospital tours at [place] will be referred to  [email or web address] for more information on childbirth classes  At this site you may register for classes. You may sign up for a waiting list if classes are full. Please SIGN UP FOR THIS!.   When the waiting list becomes long, sometimes new classes can be added.

## 2018-06-18 NOTE — Progress Notes (Signed)
  [id number] [redacted]w[redacted]d Estimated Date of Delivery: 12/21/18  Blood pressure 117/66, pulse 98, temperature 98.5 F (36.9 C), weight 156 lb 6.4 oz (70.9 kg), last menstrual period 03/16/2018.   BP weight and urine results all reviewed and noted.  Please refer to the obstetrical flow sheet for the fundal height and fetal heart rate documentation:  Pt denies any bleeding and no rupture of membranes symptoms or regular contractions. Patient doing better with nausea/vomiting. Taking only Bonjesta and patch.  Has felt well now for 5 days.  Will try to wean off over the next month.    All questions were answered.   Physical Assessment:   Vitals:   06/18/18 1541  BP: 117/66  Pulse: 98  Temp: 98.5 F (36.9 C)  Weight: 156 lb 6.4 oz (70.9 kg)  Body mass index is 25.24 kg/m.        Physical Examination:   General appearance: Well appearing, and in no distress  Mental status: Alert, oriented to person, place, and time  Skin: Warm & dry  Cardiovascular: Normal heart rate noted  Respiratory: Normal respiratory effort, no distress  Abdomen: Soft, gravid, nontender  Pelvic: Cervical exam deferred         Extremities: Edema: None  Fetal Status: Fetal Heart Rate (bpm): 162 [place]   Movement: Absent   [place] 13+3 WKS,measurements c/w dates,CRL 76.32 mm,anterior placenta,normal ovaries bilat,NB present,NT 1.8 mm  Results for orders placed or performed in visit on 06/18/18 (from the past 24 hour(s))  POC Urinalysis Dipstick OB   Collection Time: 06/18/18  3:01 PM  Result Value Ref Range   Color, UA     Clarity, UA     Glucose, UA Negative Negative   Bilirubin, UA     Ketones, UA neg    Spec Grav, UA     Blood, UA neg    pH, UA     POC,PROTEIN,UA Negative Negative, Trace, Small (1+), Moderate (2+), Large (3+), 4+   Urobilinogen, UA     Nitrite, UA neg    Leukocytes, UA Negative Negative   Appearance     Odor       Orders Placed This Encounter  Procedures  . US OB Comp + 14 Wk  . Integrated 1   . POC Urinalysis Dipstick OB    Plan:  Continued routine obstetrical care,   Return for 5 weeks LROB/anatomy/2nd IT.

## 2018-06-18 NOTE — Progress Notes (Signed)
[place] 13+3 WKS,measurements c/w dates,CRL 76.32 mm,anterior placenta,normal ovaries bilat,NB present,NT 1.8 mm

## 2018-06-21 LAB — INTEGRATED 1
Crown Rump Length: 76.3 mm
Gest. Age on Collection Date: 13.4 weeks
Maternal Age at EDD: 26.7 yr
Nuchal Translucency (NT): 1.8 mm
Number of Fetuses: 1
PAPP-A Value: 2250.9 ng/mL
Weight: 156 [lb_av]

## 2018-06-22 ENCOUNTER — Encounter: Payer: Self-pay | Admitting: Advanced Practice Midwife

## 2018-07-18 ENCOUNTER — Other Ambulatory Visit: Payer: Self-pay | Admitting: Certified Nurse Midwife

## 2018-07-19 ENCOUNTER — Telehealth: Payer: Self-pay | Admitting: Obstetrics & Gynecology

## 2018-07-19 MED ORDER — SCOPOLAMINE 1 MG/3DAYS TD PT72: 1.0000 | MEDICATED_PATCH | TRANSDERMAL | 1 refills | Status: DC

## 2018-07-24 ENCOUNTER — Ambulatory Visit (INDEPENDENT_AMBULATORY_CARE_PROVIDER_SITE_OTHER): Payer: BLUE CROSS/BLUE SHIELD | Admitting: Women's Health

## 2018-07-24 ENCOUNTER — Other Ambulatory Visit: Payer: Self-pay

## 2018-07-24 ENCOUNTER — Other Ambulatory Visit: Payer: BLUE CROSS/BLUE SHIELD

## 2018-07-24 ENCOUNTER — Ambulatory Visit (INDEPENDENT_AMBULATORY_CARE_PROVIDER_SITE_OTHER): Payer: BLUE CROSS/BLUE SHIELD

## 2018-07-24 ENCOUNTER — Encounter: Payer: Self-pay | Admitting: Women's Health

## 2018-07-24 VITALS — BP 119/69 | HR 87 | Wt 162.0 lb

## 2018-07-24 DIAGNOSIS — Z363 Encounter for antenatal screening for malformations: Secondary | ICD-10-CM

## 2018-07-24 DIAGNOSIS — Z3482 Encounter for supervision of other normal pregnancy, second trimester: Secondary | ICD-10-CM

## 2018-07-24 DIAGNOSIS — Z1389 Encounter for screening for other disorder: Secondary | ICD-10-CM

## 2018-07-24 DIAGNOSIS — Z3A18 18 weeks gestation of pregnancy: Secondary | ICD-10-CM

## 2018-07-24 DIAGNOSIS — Z3481 Encounter for supervision of other normal pregnancy, first trimester: Secondary | ICD-10-CM

## 2018-07-24 DIAGNOSIS — Z1379 Encounter for other screening for genetic and chromosomal anomalies: Secondary | ICD-10-CM | POA: Diagnosis not present

## 2018-07-24 DIAGNOSIS — Z331 Pregnant state, incidental: Secondary | ICD-10-CM

## 2018-07-24 LAB — POCT URINALYSIS DIPSTICK OB
Blood, UA: NEGATIVE
Glucose, UA: NEGATIVE
Ketones, UA: NEGATIVE
Leukocytes, UA: NEGATIVE
Nitrite, UA: NEGATIVE
POC,PROTEIN,UA: NEGATIVE

## 2018-07-24 NOTE — Progress Notes (Signed)
[place] 18+4 wks,breech,anterior placenta gr 0,cx 2.9 cm,normal right ovary,left ovary not visualized,svp of fluid 4.3 cm,fhr 154 bpm,efw 274 g 75%,anatomy complete,no obvious abnormalities

## 2018-07-24 NOTE — Patient Instructions (Signed)
Dayton Martes, I greatly value your feedback.  If you receive a survey following your visit with [place] today, we appreciate you taking the time to fill it out.  Thanks, Joellyn Haff, CNM, [facility]  [facility] HAS MOVED!!! It is now [facility] ([address] [place], [place] [address]) Entrance located off of [address] Free 24/7 valet parking   Home Blood Pressure Monitoring for Patients   Your provider has recommended that you check your blood pressure (BP) at least once a week at home. If you do not have a blood pressure cuff at home, one will be provided for you. Contact your provider if you have not received your monitor within 1 week.   Helpful Tips for Accurate Home Blood Pressure Checks  . Don't smoke, exercise, or drink caffeine 30 minutes before checking your BP . Use the restroom before checking your BP (a full bladder can raise your pressure) . Relax in a comfortable upright chair . Feet on the ground . Left arm resting comfortably on a flat surface at the level of your heart . Legs uncrossed . Back supported . Sit quietly and don't talk . Place the cuff on your bare arm . Adjust snuggly, so that only two fingertips can fit between your skin and the top of the cuff . Check 2 readings separated by at least one minute . Keep a log of your BP readings . For a visual, please reference this diagram: http://ccnc.care/bpdiagram  Provider Name: [facility]     Phone: [phone]  Zone 1: ALL CLEAR  Continue to monitor your symptoms:  . BP reading is less than 140 (top number) or less than 90 (bottom number)  . No right upper stomach pain . No headaches or seeing spots . No feeling nauseated or throwing up . No swelling in face and hands  Zone 2: CAUTION Call your doctor's office for any of the following:  . BP reading is greater than 140 (top number) or greater than 90 (bottom number)  . Stomach pain under your ribs in the middle or  right side . Headaches or seeing spots . Feeling nauseated or throwing up . Swelling in face and hands  Zone 3: EMERGENCY  Seek immediate medical care if you have any of the following:  . BP reading is greater than160 (top number) or greater than 110 (bottom number) . Severe headaches not improving with Tylenol . Serious difficulty catching your breath . Any worsening symptoms from Zone 2      Second Trimester of Pregnancy The second trimester is from week 14 through week 27 (months 4 through 6). The second trimester is often a time when you feel your best. Your body has adjusted to being pregnant, and you begin to feel better physically. Usually, morning sickness has lessened or quit completely, you may have more energy, and you may have an increase in appetite. The second trimester is also a time when the fetus is growing rapidly. At the end of the sixth month, the fetus is about 9 inches long and weighs about 1 pounds. You will likely begin to feel the baby move (quickening) between 16 and 20 weeks of pregnancy. Body changes during your second trimester Your body continues to go through many changes during your second trimester. The changes vary from woman to woman.  Your weight will continue to increase. You will notice your lower abdomen bulging out.  You may begin to  get stretch marks on your hips, abdomen, and breasts.  You may develop headaches that can be relieved by medicines. The medicines should be approved by your health care provider.  You may urinate more often because the fetus is pressing on your bladder.  You may develop or continue to have heartburn as a result of your pregnancy.  You may develop constipation because certain hormones are causing the muscles that push waste through your intestines to slow down.  You may develop hemorrhoids or swollen, bulging veins (varicose veins).  You may have back pain. This is caused by: ? Weight gain. ? Pregnancy hormones  that are relaxing the joints in your pelvis. ? A shift in weight and the muscles that support your balance.  Your breasts will continue to grow and they will continue to become tender.  Your gums may bleed and may be sensitive to brushing and flossing.  Dark spots or blotches (chloasma, mask of pregnancy) may develop on your face. This will likely fade after the baby is born.  A dark line from your belly button to the pubic area (linea nigra) may appear. This will likely fade after the baby is born.  You may have changes in your hair. These can include thickening of your hair, rapid growth, and changes in texture. Some women also have hair loss during or after pregnancy, or hair that feels dry or thin. Your hair will most likely return to normal after your baby is born.  What to expect at prenatal visits During a routine prenatal visit:  You will be weighed to make sure you and the fetus are growing normally.  Your blood pressure will be taken.  Your abdomen will be measured to track your baby's growth.  The fetal heartbeat will be listened to.  Any test results from the previous visit will be discussed.  Your health care provider may ask you:  How you are feeling.  If you are feeling the baby move.  If you have had any abnormal symptoms, such as leaking fluid, bleeding, severe headaches, or abdominal cramping.  If you are using any tobacco products, including cigarettes, chewing tobacco, and electronic cigarettes.  If you have any questions.  Other tests that may be performed during your second trimester include:  Blood tests that check for: ? Low iron levels (anemia). ? High blood sugar that affects pregnant women (gestational diabetes) between 62 and 28 weeks. ? Rh antibodies. This is to check for a protein on red blood cells (Rh factor).  Urine tests to check for infections, diabetes, or protein in the urine.  An ultrasound to confirm the proper growth and  development of the baby.  An amniocentesis to check for possible genetic problems.  Fetal screens for spina bifida and Down syndrome.  HIV (human immunodeficiency virus) testing. Routine prenatal testing includes screening for HIV, unless you choose not to have this test.  Follow these instructions at home: Medicines  Follow your health care provider's instructions regarding medicine use. Specific medicines may be either safe or unsafe to take during pregnancy.  Take a prenatal vitamin that contains at least 600 micrograms (mcg) of folic acid.  If you develop constipation, try taking a stool softener if your health care provider approves. Eating and drinking  Eat a balanced diet that includes fresh fruits and vegetables, whole grains, good sources of protein such as meat, eggs, or tofu, and low-fat dairy. Your health care provider will help you determine the amount of weight gain  that is right for you.  Avoid raw meat and uncooked cheese. These carry germs that can cause birth defects in the baby.  If you have low calcium intake from food, talk to your health care provider about whether you should take a daily calcium supplement.  Limit foods that are high in fat and processed sugars, such as fried and sweet foods.  To prevent constipation: ? Drink enough fluid to keep your urine clear or pale yellow. ? Eat foods that are high in fiber, such as fresh fruits and vegetables, whole grains, and beans. Activity  Exercise only as directed by your health care provider. Most women can continue their usual exercise routine during pregnancy. Try to exercise for 30 minutes at least 5 days a week. Stop exercising if you experience uterine contractions.  Avoid heavy lifting, wear low heel shoes, and practice good posture.  A sexual relationship may be continued unless your health care provider directs you otherwise. Relieving pain and discomfort  Wear a good support bra to prevent discomfort  from breast tenderness.  Take warm sitz baths to soothe any pain or discomfort caused by hemorrhoids. Use hemorrhoid cream if your health care provider approves.  Rest with your legs elevated if you have leg cramps or low back pain.  If you develop varicose veins, wear support hose. Elevate your feet for 15 minutes, 3-4 times a day. Limit salt in your diet. Prenatal Care  Write down your questions. Take them to your prenatal visits.  Keep all your prenatal visits as told by your health care provider. This is important. Safety  Wear your seat belt at all times when driving.  Make a list of emergency phone numbers, including numbers for family, friends, the hospital, and police and fire departments. General instructions  Ask your health care provider for a referral to a local prenatal education class. Begin classes no later than the beginning of month 6 of your pregnancy.  Ask for help if you have counseling or nutritional needs during pregnancy. Your health care provider can offer advice or refer you to specialists for help with various needs.  Do not use hot tubs, steam rooms, or saunas.  Do not douche or use tampons or scented sanitary pads.  Do not cross your legs for long periods of time.  Avoid cat litter boxes and soil used by cats. These carry germs that can cause birth defects in the baby and possibly loss of the fetus by miscarriage or stillbirth.  Avoid all smoking, herbs, alcohol, and unprescribed drugs. Chemicals in these products can affect the formation and growth of the baby.  Do not use any products that contain nicotine or tobacco, such as cigarettes and e-cigarettes. If you need help quitting, ask your health care provider.  Visit your dentist if you have not gone yet during your pregnancy. Use a soft toothbrush to brush your teeth and be gentle when you floss. Contact a health care provider if:  You have dizziness.  You have mild pelvic cramps, pelvic  pressure, or nagging pain in the abdominal area.  You have persistent nausea, vomiting, or diarrhea.  You have a bad smelling vaginal discharge.  You have pain when you urinate. Get help right away if:  You have a fever.  You are leaking fluid from your vagina.  You have spotting or bleeding from your vagina.  You have severe abdominal cramping or pain.  You have rapid weight gain or weight loss.  You have shortness of  breath with chest pain.  You notice sudden or extreme swelling of your face, hands, ankles, feet, or legs.  You have not felt your baby move in over an hour.  You have severe headaches that do not go away when you take medicine.  You have vision changes. Summary  The second trimester is from week 14 through week 27 (months 4 through 6). It is also a time when the fetus is growing rapidly.  Your body goes through many changes during pregnancy. The changes vary from woman to woman.  Avoid all smoking, herbs, alcohol, and unprescribed drugs. These chemicals affect the formation and growth your baby.  Do not use any tobacco products, such as cigarettes, chewing tobacco, and e-cigarettes. If you need help quitting, ask your health care provider.  Contact your health care provider if you have any questions. Keep all prenatal visits as told by your health care provider. This is important. This information is not intended to replace advice given to you by your health care provider. Make sure you discuss any questions you have with your health care provider. Document Released: 02/08/2001 Document Revised: 07/23/2015 Document Reviewed: 04/17/2012 Elsevier Interactive Patient Education  2017 [employer]vier Inc.   Coronavirus (COVID-19) Are you at risk?  Are you at risk for the Coronavirus (COVID-19)?  To be considered HIGH RISK for Coronavirus (COVID-19), you have to meet the following criteria:  . Traveled to [place], [place], [place], [place] or [place]; or in the [place] to [place], [place], [place], or [place]; and have fever, cough, and shortness of breath within the last 2 weeks of travel OR . Been in close contact with a person diagnosed with COVID-19 within the last 2 weeks and have fever, cough, and shortness of breath . IF YOU DO NOT MEET THESE CRITERIA, YOU ARE CONSIDERED LOW RISK FOR COVID-19.  What to do if you are HIGH RISK for COVID-19?  Marland Kitchen. If you are having a medical emergency, call 911. . Seek medical care right away. Before you go to a doctor's office, urgent care or emergency department, call ahead and tell them about your recent travel, contact with someone diagnosed with COVID-19, and your symptoms. You should receive instructions from your physician's office regarding next steps of care.  . When you arrive at healthcare provider, tell the healthcare staff immediately you have returned from visiting [place], [place], [place], [place] or [place]; or traveled in the [place] to [place], [place], [place], or [place]; in the last two weeks or you have been in close contact with a person diagnosed with COVID-19 in the last 2 weeks.   . Tell the health care staff about your symptoms: fever, cough and shortness of breath. . After you have been seen by a medical provider, you will be either: o Tested for (COVID-19) and discharged home on quarantine except to seek medical care if symptoms worsen, and asked to  - Stay home and avoid contact with others until you get your results (4-5 days)  - Avoid travel on public transportation if possible (such as bus, train, or airplane) or o Sent to the Emergency Department by EMS for evaluation, COVID-19 testing, and possible admission depending on your condition and test results.  What to do if you are LOW RISK for COVID-19?  Reduce your risk of any infection by using the same precautions used for avoiding the common cold or flu:  Marland Kitchen. Wash your hands often with soap and warm  water for at  least 20 seconds.  If soap and water are not readily available, use an alcohol-based hand sanitizer with at least 60% alcohol.  . If coughing or sneezing, cover your mouth and nose by coughing or sneezing into the elbow areas of your shirt or coat, into a tissue or into your sleeve (not your hands). . Avoid shaking hands with others and consider head nods or verbal greetings only. . Avoid touching your eyes, nose, or mouth with unwashed hands.  . Avoid close contact with people who are sick. . Avoid places or events with large numbers of people in one location, like concerts or sporting events. . Carefully consider travel plans you have or are making. . If you are planning any travel outside or inside the [place], visit the CDC's Travelers' Health webpage for the latest health notices. . If you have some symptoms but not all symptoms, continue to monitor at home and seek medical attention if your symptoms worsen. . If you are having a medical emergency, call 911.   ADDITIONAL HEALTHCARE OPTIONS FOR PATIENTS  [facility] / e-Visit: [email or web address]         [facility]: [phone]  [facility]: [phone]                   [facility]: [phone]

## 2018-07-24 NOTE — Progress Notes (Signed)
   LOW-RISK PREGNANCY VISIT Patient name: Cheyenne Bauer MRN [id number]  Date of birth: [birth year] Chief Complaint:   Routine Prenatal Visit ([place] 2nd IT)  History of Present Illness:   Cheyenne Bauer is a 28 y.o. G51P0020 female at [redacted]w[redacted]d with an Estimated Date of Delivery: 12/21/18 being seen today for ongoing management of a low-risk pregnancy.  Today she reports no complaints. N/V much better, requires 1 bonjesta and scop patch. 6lb wt gain from last visit, feels 'human' again! Some swelling. Contractions: Not present. Vag. Bleeding: None.  Movement: Present. denies leaking of fluid. Review of Systems:   Pertinent items are noted in HPI Denies abnormal vaginal discharge w/ itching/odor/irritation, headaches, visual changes, shortness of breath, chest pain, abdominal pain, severe nausea/vomiting, or problems with urination or bowel movements unless otherwise stated above. Pertinent History Reviewed:  Reviewed past medical,surgical, social, obstetrical and family history.  Reviewed problem list, medications and allergies. Physical Assessment:   Vitals:   07/24/18 1417  BP: 119/69  Pulse: 87  Weight: 162 lb (73.5 kg)  Body mass index is 26.15 kg/m.        Physical Examination:   General appearance: Well appearing, and in no distress  Mental status: Alert, oriented to person, place, and time  Skin: Warm & dry  Cardiovascular: Normal heart rate noted  Respiratory: Normal respiratory effort, no distress  Abdomen: Soft, gravid, nontender  Pelvic: Cervical exam deferred         Extremities: Edema: None  Fetal Status: Fetal Heart Rate (bpm): 154 u/s   Movement: Present     Today's [place] 18+4 wks,breech,anterior placenta gr 0,cx 2.9 cm,normal right ovary,left ovary not visualized,svp of fluid 4.3 cm,fhr 154 bpm,efw 274 g 75%,anatomy complete,no obvious abnormalities   Results for orders placed or performed in visit on 07/24/18 (from the past 24 hour(s))  POC Urinalysis Dipstick OB   Collection Time: 07/24/18  2:16 PM  Result Value Ref Range   Color, UA     Clarity, UA     Glucose, UA Negative Negative   Bilirubin, UA     Ketones, UA neg    Spec Grav, UA     Blood, UA neg    pH, UA     POC,PROTEIN,UA Negative Negative, Trace, Small (1+), Moderate (2+), Large (3+), 4+   Urobilinogen, UA     Nitrite, UA neg    Leukocytes, UA Negative Negative   Appearance     Odor      Assessment & Plan:  1) Low-risk pregnancy G3P0020 at [redacted]w[redacted]d with an Estimated Date of Delivery: 12/21/18    Meds: No orders of the defined types were placed in this encounter.  Labs/procedures today: anatomy u/s,   Plan:  Continue routine obstetrical care 2nd IT  Reviewed: Preterm labor symptoms and general obstetric precautions including but not limited to vaginal bleeding, contractions, leaking of fluid and fetal movement were reviewed in detail with the patient.  All questions were answered. Has access to bp cuff. Check bp weekly, let us know if >140/90.   Follow-up: Return in about 4 weeks (around 08/21/2018) for LROB webex.  Orders Placed This Encounter  Procedures  . INTEGRATED 2  . POC Urinalysis Dipstick OB   Cheral Marker CNM, [facility] 07/24/2018 2:46 PM

## 2018-07-26 LAB — INTEGRATED 2
AFP MoM: 1.5
Alpha-Fetoprotein: 63.4 ng/mL
Crown Rump Length: 76.3 mm
DIA MoM: 0.61
DIA Value: 101.7 pg/mL
Estriol, Unconjugated: 1.9 ng/mL
Gest. Age on Collection Date: 13.4 weeks
Gestational Age: 18.6 weeks
Maternal Age at EDD: 26.7 yr
Nuchal Translucency (NT): 1.8 mm
Nuchal Translucency MoM: 1.02
Number of Fetuses: 1
PAPP-A MoM: 1.65
PAPP-A Value: 2250.9 ng/mL
Test Results:: NEGATIVE
Weight: 156 [lb_av]
Weight: 156 [lb_av]
hCG MoM: 1.27
hCG Value: 30.4 IU/mL
uE3 MoM: 1.24

## 2018-08-20 ENCOUNTER — Telehealth: Payer: Self-pay | Admitting: Women's Health

## 2018-08-20 NOTE — Telephone Encounter (Signed)
Pt states that she has a skin tag on her labia and that yesterday it got caught on her underwear yesterday and it now swollen. Requesting an appt.

## 2018-08-20 NOTE — Telephone Encounter (Signed)
Patient states she has a skin tag in her vaginal area that has gotten caught in her clothes and is swollen. Has appt tomorrow but is scheduled as a webex.  Advised to apply ice to the area today in hopes so of the swelling can go down by tomorrow and they may be able to clip it off at her appt tomorrow. Pt verbalized understanding and agreeable to plan.  Prescreen questions completed as well.

## 2018-08-21 ENCOUNTER — Other Ambulatory Visit: Payer: Self-pay | Admitting: Women's Health

## 2018-08-21 ENCOUNTER — Other Ambulatory Visit: Payer: Self-pay

## 2018-08-21 ENCOUNTER — Ambulatory Visit (INDEPENDENT_AMBULATORY_CARE_PROVIDER_SITE_OTHER): Payer: BC Managed Care – PPO | Admitting: Women's Health

## 2018-08-21 ENCOUNTER — Encounter: Payer: Self-pay | Admitting: Women's Health

## 2018-08-21 VITALS — BP 122/67 | HR 92

## 2018-08-21 DIAGNOSIS — L919 Hypertrophic disorder of the skin, unspecified: Secondary | ICD-10-CM | POA: Diagnosis not present

## 2018-08-21 DIAGNOSIS — N9089 Other specified noninflammatory disorders of vulva and perineum: Secondary | ICD-10-CM | POA: Diagnosis not present

## 2018-08-21 DIAGNOSIS — Z3482 Encounter for supervision of other normal pregnancy, second trimester: Secondary | ICD-10-CM

## 2018-08-21 DIAGNOSIS — Z1389 Encounter for screening for other disorder: Secondary | ICD-10-CM

## 2018-08-21 DIAGNOSIS — Z331 Pregnant state, incidental: Secondary | ICD-10-CM

## 2018-08-21 DIAGNOSIS — Z3A22 22 weeks gestation of pregnancy: Secondary | ICD-10-CM

## 2018-08-21 LAB — POCT URINALYSIS DIPSTICK OB
Blood, UA: NEGATIVE
Glucose, UA: NEGATIVE
Ketones, UA: NEGATIVE
Leukocytes, UA: NEGATIVE
Nitrite, UA: NEGATIVE
POC,PROTEIN,UA: NEGATIVE

## 2018-08-21 NOTE — Patient Instructions (Signed)
Cheyenne Bauer, I greatly value your feedback.  If you receive a survey following your visit with [place] today, we appreciate you taking the time to fill it out.  Thanks, Knute Neu, CNM, WHNP-BC   You will have your sugar test next visit.  Please do not eat or drink anything after midnight the night before you come, not even water.  You will be here for at least two hours.     [place]!!! It is now [place] [facility] ([place], [place] [address]) Entrance located off of [address] parking   Call the office [phone]) or go to [facility] if:  You begin to have strong, frequent contractions  Your water breaks.  Sometimes it is a big gush of fluid, sometimes it is just a trickle that keeps getting your panties wet or running down your legs  You have vaginal bleeding.  It is normal to have a small amount of spotting if your cervix was checked.   You don't feel your baby moving like normal.  If you don't, get you something to eat and drink and lay down and focus on feeling your baby move.   If your baby is still not moving like normal, you should call the office or go to [facility]   Your provider has recommended that you check your blood pressure (BP) at least once a week at home. If you do not have a blood pressure cuff at home, one will be provided for you. Contact your provider if you have not received your monitor within 1 week.   Helpful Tips for Accurate Home Blood Pressure Checks  . Don't smoke, exercise, or drink caffeine 30 minutes before checking your BP . Use the restroom before checking your BP (a full bladder can raise your pressure) . Relax in a comfortable upright chair . Feet on the ground . Left arm resting comfortably on a flat surface at the level of your heart . Legs uncrossed . Back supported . Sit quietly and don't talk . Place the cuff on  your bare arm . Adjust snuggly, so that only two fingertips can fit between your skin and the top of the cuff . Check 2 readings separated by at least one minute . Keep a log of your BP readings . For a visual, please reference this diagram: http://ccnc.care/bpdiagram  Provider Name: [facility]     Phone: [phone]  Zone 1: ALL CLEAR  Continue to monitor your symptoms:  . BP reading is less than 140 (top number) or less than 90 (bottom number)  . No right upper stomach pain . No headaches or seeing spots . No feeling nauseated or throwing up . No swelling in face and hands  Zone 2: CAUTION Call your doctor's office for any of the following:  . BP reading is greater than 140 (top number) or greater than 90 (bottom number)  . Stomach pain under your ribs in the middle or right side . Headaches or seeing spots . Feeling nauseated or throwing up . Swelling in face and hands  Zone 3: EMERGENCY  Seek immediate medical care if you have any of the following:  . BP reading is greater than160 (top number) or greater than 110 (bottom number) . Severe headaches not improving with Tylenol . Serious difficulty catching your breath . Any worsening symptoms from Zone 2  Second Trimester of Pregnancy The second trimester is from week 13 through week 28, months 4 through 6. The second trimester is often a time when you feel your best. Your body has also adjusted to being pregnant, and you begin to feel better physically. Usually, morning sickness has lessened or quit completely, you may have more energy, and you may have an increase in appetite. The second trimester is also a time when the fetus is growing rapidly. At the end of the sixth month, the fetus is about 9 inches long and weighs about 1 pounds. You will likely begin to feel the baby move (quickening) between 18 and 20 weeks of the pregnancy. BODY CHANGES Your body goes through many changes during pregnancy. The changes vary  from woman to woman.   Your weight will continue to increase. You will notice your lower abdomen bulging out.  You may begin to get stretch marks on your hips, abdomen, and breasts.  You may develop headaches that can be relieved by medicines approved by your health care provider.  You may urinate more often because the fetus is pressing on your bladder.  You may develop or continue to have heartburn as a result of your pregnancy.  You may develop constipation because certain hormones are causing the muscles that push waste through your intestines to slow down.  You may develop hemorrhoids or swollen, bulging veins (varicose veins).  You may have back pain because of the weight gain and pregnancy hormones relaxing your joints between the bones in your pelvis and as a result of a shift in weight and the muscles that support your balance.  Your breasts will continue to grow and be tender.  Your gums may bleed and may be sensitive to brushing and flossing.  Dark spots or blotches (chloasma, mask of pregnancy) may develop on your face. This will likely fade after the baby is born.  A dark line from your belly button to the pubic area (linea nigra) may appear. This will likely fade after the baby is born.  You may have changes in your hair. These can include thickening of your hair, rapid growth, and changes in texture. Some women also have hair loss during or after pregnancy, or hair that feels dry or thin. Your hair will most likely return to normal after your baby is born. WHAT TO EXPECT AT YOUR PRENATAL VISITS During a routine prenatal visit:  You will be weighed to make sure you and the fetus are growing normally.  Your blood pressure will be taken.  Your abdomen will be measured to track your baby's growth.  The fetal heartbeat will be listened to.  Any test results from the previous visit will be discussed. Your health care provider may ask you:  How you are feeling.  If  you are feeling the baby move.  If you have had any abnormal symptoms, such as leaking fluid, bleeding, severe headaches, or abdominal cramping.  If you have any questions. Other tests that may be performed during your second trimester include:  Blood tests that check for:  Low iron levels (anemia).  Gestational diabetes (between 24 and 28 weeks).  Rh antibodies.  Urine tests to check for infections, diabetes, or protein in the urine.  An ultrasound to confirm the proper growth and development of the baby.  An amniocentesis to check for possible genetic problems.  Fetal screens for spina bifida and Down syndrome. HOME CARE INSTRUCTIONS   Avoid all smoking, herbs, alcohol, and  unprescribed drugs. These chemicals affect the formation and growth of the baby.  Follow your health care provider's instructions regarding medicine use. There are medicines that are either safe or unsafe to take during pregnancy.  Exercise only as directed by your health care provider. Experiencing uterine cramps is a good sign to stop exercising.  Continue to eat regular, healthy meals.  Wear a good support bra for breast tenderness.  Do not use hot tubs, steam rooms, or saunas.  Wear your seat belt at all times when driving.  Avoid raw meat, uncooked cheese, cat litter boxes, and soil used by cats. These carry germs that can cause birth defects in the baby.  Take your prenatal vitamins.  Try taking a stool softener (if your health care provider approves) if you develop constipation. Eat more high-fiber foods, such as fresh vegetables or fruit and whole grains. Drink plenty of fluids to keep your urine clear or pale yellow.  Take warm sitz baths to soothe any pain or discomfort caused by hemorrhoids. Use hemorrhoid cream if your health care provider approves.  If you develop varicose veins, wear support hose. Elevate your feet for 15 minutes, 3-4 times a day. Limit salt in your diet.  Avoid  heavy lifting, wear low heel shoes, and practice good posture.  Rest with your legs elevated if you have leg cramps or low back pain.  Visit your dentist if you have not gone yet during your pregnancy. Use a soft toothbrush to brush your teeth and be gentle when you floss.  A sexual relationship may be continued unless your health care provider directs you otherwise.  Continue to go to all your prenatal visits as directed by your health care provider. SEEK MEDICAL CARE IF:   You have dizziness.  You have mild pelvic cramps, pelvic pressure, or nagging pain in the abdominal area.  You have persistent nausea, vomiting, or diarrhea.  You have a bad smelling vaginal discharge.  You have pain with urination. SEEK IMMEDIATE MEDICAL CARE IF:   You have a fever.  You are leaking fluid from your vagina.  You have spotting or bleeding from your vagina.  You have severe abdominal cramping or pain.  You have rapid weight gain or loss.  You have shortness of breath with chest pain.  You notice sudden or extreme swelling of your face, hands, ankles, feet, or legs.  You have not felt your baby move in over an hour.  You have severe headaches that do not go away with medicine.  You have vision changes. Document Released: 02/08/2001 Document Revised: 02/19/2013 Document Reviewed: 04/17/2012 [facility] Patient Information 2015 [place], [place]. This information is not intended to replace advice given to you by your health care provider. Make sure you discuss any questions you have with your health care provider.

## 2018-08-21 NOTE — Progress Notes (Signed)
   LOW-RISK PREGNANCY VISIT Patient name: Cheyenne Bauer MRN [id number]  Date of birth: 26-Jul-1990 Chief Complaint:   Routine Prenatal Visit (skin tag on labia swollen)  History of Present Illness:   Cheyenne Bauer is a 28 y.o. G74P0020 female at [redacted]w[redacted]d with an Estimated Date of Delivery: 12/21/18 being seen today for ongoing management of a low-risk pregnancy.  Today she reports skin tag developed Lt labia majora during earlier pregnancy, got caught on underwear the other day and became very swollen, applied ice, swelling decreased, wants it removed. Contractions: Not present. Vag. Bleeding: None.  Movement: Present. denies leaking of fluid. Review of Systems:   Pertinent items are noted in HPI Denies abnormal vaginal discharge w/ itching/odor/irritation, headaches, visual changes, shortness of breath, chest pain, abdominal pain, severe nausea/vomiting, or problems with urination or bowel movements unless otherwise stated above. Pertinent History Reviewed:  Reviewed past medical,surgical, social, obstetrical and family history.  Reviewed problem list, medications and allergies. Physical Assessment:   Vitals:   08/21/18 1411  BP: 122/67  Pulse: 92  There is no height or weight on file to calculate BMI.        Physical Examination:   General appearance: Well appearing, and in no distress  Mental status: Alert, oriented to person, place, and time  Skin: Warm & dry  Cardiovascular: Normal heart rate noted  Respiratory: Normal respiratory effort, no distress  Abdomen: Soft, gravid, nontender  Pelvic: ~1cm pedunculated fleshy skin tag Lt labia majora, cleansed w/ betadine, clamped at stalk w/ hemastat x few minutes then hemastat removed and skin tag cut off w/ scissors, no bleeding at all, pt tolerated well, will send to pathology         Extremities: Edema: None  Fetal Status: Fetal Heart Rate (bpm): 135 Fundal Height: 22 cm Movement: Present    Results for orders placed or performed in  visit on 08/21/18 (from the past 24 hour(s))  POC Urinalysis Dipstick OB   Collection Time: 08/21/18  2:17 PM  Result Value Ref Range   Color, UA     Clarity, UA     Glucose, UA Negative Negative   Bilirubin, UA     Ketones, UA neg    Spec Grav, UA     Blood, UA neg    pH, UA     POC,PROTEIN,UA Negative Negative, Trace, Small (1+), Moderate (2+), Large (3+), 4+   Urobilinogen, UA     Nitrite, UA neg    Leukocytes, UA Negative Negative   Appearance     Odor      Assessment & Plan:  1) Low-risk pregnancy G3P0020 at [redacted]w[redacted]d with an Estimated Date of Delivery: 12/21/18   2) Lt labia majora skin tag removal, per pt request d/t getting caught in underwear, sent to path   Meds: No orders of the defined types were placed in this encounter.  Labs/procedures today: skin tag removal  Plan:  Continue routine obstetrical care   Reviewed: Preterm labor symptoms and general obstetric precautions including but not limited to vaginal bleeding, contractions, leaking of fluid and fetal movement were reviewed in detail with the patient.  All questions were answered  Follow-up: Return in about 31 days (around 09/21/2018) for [place] in office, PN2.  Orders Placed This Encounter  Procedures  . POC Urinalysis Dipstick OB   Roma Schanz CNM, [facility] 08/21/2018 2:56 PM

## 2018-09-21 ENCOUNTER — Ambulatory Visit (INDEPENDENT_AMBULATORY_CARE_PROVIDER_SITE_OTHER): Payer: Self-pay | Admitting: Obstetrics and Gynecology

## 2018-09-21 ENCOUNTER — Other Ambulatory Visit: Payer: Self-pay

## 2018-09-21 ENCOUNTER — Encounter: Payer: Self-pay | Admitting: Obstetrics and Gynecology

## 2018-09-21 VITALS — BP 123/69 | HR 85 | Wt 192.5 lb

## 2018-09-21 DIAGNOSIS — Z3482 Encounter for supervision of other normal pregnancy, second trimester: Secondary | ICD-10-CM

## 2018-09-21 DIAGNOSIS — Z3A27 27 weeks gestation of pregnancy: Secondary | ICD-10-CM

## 2018-09-21 DIAGNOSIS — Z331 Pregnant state, incidental: Secondary | ICD-10-CM

## 2018-09-21 DIAGNOSIS — Z3403 Encounter for supervision of normal first pregnancy, third trimester: Secondary | ICD-10-CM

## 2018-09-21 DIAGNOSIS — Z1389 Encounter for screening for other disorder: Secondary | ICD-10-CM

## 2018-09-21 DIAGNOSIS — Z832 Family history of diseases of the blood and blood-forming organs and certain disorders involving the immune mechanism: Secondary | ICD-10-CM | POA: Diagnosis not present

## 2018-09-21 LAB — POCT URINALYSIS DIPSTICK OB
Blood, UA: NEGATIVE
Glucose, UA: NEGATIVE
Ketones, UA: NEGATIVE
Leukocytes, UA: NEGATIVE
Nitrite, UA: NEGATIVE
POC,PROTEIN,UA: NEGATIVE

## 2018-09-21 NOTE — Progress Notes (Signed)
Patient ID: Cheyenne Bauer, female   DOB: 1990/07/11, 28 y.o.   MRN: [id number]   LOW-RISK PREGNANCY VISIT Patient name: Cheyenne Bauer MRN [id number]  Date of birth: 10-09-1990 Chief Complaint:   Routine Prenatal Visit (PN2)  History of Present Illness:   Cheyenne Bauer is a 28 y.o. G50P0020 female at [redacted]w[redacted]d with an Estimated Date of Delivery: 12/21/18 being seen today for ongoing management of a low-risk pregnancy. Sister is a carrier for heterozygote thrombophiliaProthrombin G20210 carrier.. Mother's father has clotting issues and mother is carrier. Sister had DVT in leg during first pregnancy. [name] is doing so far in the pregnancy Today she reports no complaints. Contractions: Not present. Vag. Bleeding: None.  Movement: Present. denies leaking of fluid. Review of Systems:   Pertinent items are noted in HPI Denies abnormal vaginal discharge w/ itching/odor/irritation, headaches, visual changes, shortness of breath, chest pain, abdominal pain, severe nausea/vomiting, or problems with urination or bowel movements unless otherwise stated above. Pertinent History Reviewed:  Reviewed past medical,surgical, social, obstetrical and family history.  Reviewed problem list, medications and allergies. Physical Assessment:   Vitals:   09/21/18 0924  BP: 123/69  Pulse: 85  Weight: 192 lb 8 oz (87.3 kg)  Body mass index is 31.07 kg/m.        Physical Examination:   General appearance: Well appearing, and in no distress  Mental status: Alert, oriented to person, place, and time  Skin: Warm & dry  Cardiovascular: Normal heart rate noted  Respiratory: Normal respiratory effort, no distress  Abdomen: Soft, gravid, nontender  Pelvic: Cervical exam deferred         Extremities: Edema: Trace  Fetal Status: Fetal Heart Rate (bpm): 150 Fundal Height: 29 cm Movement: Present    Results for orders placed or performed in visit on 09/21/18 (from the past 24 hour(s))  POC Urinalysis Dipstick OB   Collection  Time: 09/21/18  9:25 AM  Result Value Ref Range   Color, UA     Clarity, UA     Glucose, UA Negative Negative   Bilirubin, UA     Ketones, UA neg    Spec Grav, UA     Blood, UA neg    pH, UA     POC,PROTEIN,UA Negative Negative, Trace, Small (1+), Moderate (2+), Large (3+), 4+   Urobilinogen, UA     Nitrite, UA neg    Leukocytes, UA Negative Negative   Appearance     Odor      Assessment & Plan:  1) Low-risk pregnancy G3P0020 at [redacted]w[redacted]d with an Estimated Date of Delivery: 12/21/18   2) family history of prothrombin G 20210 mutation, sister had DVT during pregnancy.  Thrombophilia panel ordered   Meds: No orders of the defined types were placed in this encounter.  Labs/procedures today: PN2, ordered thrombophilia panel  Plan:  Continue routine obstetrical care F/u in 4 week w/ televisit  Reviewed: Preterm labor symptoms and general obstetric precautions including but not limited to vaginal bleeding, contractions, leaking of fluid and fetal movement were reviewed in detail with the patient.  All questions were answered.   Follow-up: Return in about 3 weeks (around 10/12/2018) for Televisit.  Orders Placed This Encounter  Procedures  . POC Urinalysis Dipstick OB   By signing my name below, I, Samul Dada, attest that this documentation has been prepared under the direction and in the presence of Jonnie Kind, MD. Electronically Signed: [place]. 09/21/18. 9:54 AM.  I personally  performed the services described in this documentation, which was SCRIBED in my presence. The recorded information has been reviewed and considered accurate. It has been edited as necessary during review. Jonnie Kind, MD

## 2018-09-21 NOTE — Patient Instructions (Signed)
[facility] [place] °Call to Register: [phone] or [phone]   or   Register Online: [email or web address] °THESE CLASSES FILL UP VERY QUICKLY, SO SIGN UP AS SOON AS YOU CAN!!! °Please visit [facility] pregnancy website at [email or web address] ° °Childbirth Classes  °Option 1: Birth & Baby Series °? Series of 3 weekly classes, on the same day of the week (can choose Mon-Thurs) from 6-9pm °? Helps you and your support person prepare for childbirth °? Reviews newborn care, labor & birth, cesarean birth, pain management, and comfort techniques °? Cost: $60 per couple for insured or self-pay, $30 per couple for Medicaid ° °Option 2: Weekend Birth & Baby °? This class is a weekend version of our Birth & Baby series.  It is designed for parents who have a difficult time fitting several weeks of classes into their schedule.   °? Covers the care of your newborn and the basics of labor and childbirth °? Friday 6:30pm-8:30pm Saturday 9am-4pm, includes lunch for you and your partner  °? Cost: $75 per couple for insured or self-pay, $30 per couple for Medicaid ° °Option 3: Natural Childbirth °? This series of 5 weekly classes is for expectant parents who want to learn and practice natural methods of coping with the process of labor and childbirth.  Can choose Mon or Tues, 7-9pm.   °? Covers relaxation, breathing, massage, visualization, role of the partner, and helpful positioning °? Participants learn how to be confident in their body's ability to give birth. Class empowers and helps parents make informed decisions about care. Includes discussion that will help new parents transition into the immediate postpartum period.  °? Cost: $75 per couple for insured or self-pay, $30 per couple for Medicaid ° °Option 4: Online Birth & Baby °? This online class offers you the freedom to complete a Birth & Baby series in the comfort of your own home.  The flexibility of this option allows you to review  sections at your own pace, at times convenient to you and your support people.  It includes additional video information, animations, quizzes and extended activities. Get organized with helpful eClass tools, checklists, and trackers.  °? Cost: $60 for 60 days of online access °    °                                                                       °Other Available Classes ° °Baby & Me °Enjoy this time to discuss newborn & infant parenting topics and family adjustment issues with other new mothers in a relaxed environment. Each week brings a new speaker or baby-centered activity. We encourage mothers and their babies (birth to crawling) to join us. You are welcome to visit this group even if you haven't delivered yet! It's wonderful to make new friends early and watch other moms interact with their babies. No registration or fee.  °Big Brother/Big Sister °Let your children share in the joy of a new brother or sister in this special class designed just for them. Discussion includes how families care for babies: swaddling, holding, diapering, safety, as well as how they can be helpful in their new role. This class is designed for children ages 2 to 6, but any age is   welcome. Please register each child individually. $5 °Breastfeeding Support Group °This group is a mother-to-mother support circle where moms have the opportunity to share their breastfeeding experiences. A Breastfeeding Support nurse is present for questions and concerns. An infant scale is available for weight checks. No fee or registration.  °Breastfeeding Your Baby °Breastfeeding is a special time for mother and child. This class will help you feel ready to begin this important relationship. Your partner is encouraged to attend with you. Learn what to expect and feel more confident in the first days of breastfeeding your newborn. This class also addresses the most common fears and challenges of breastfeeding during the first few weeks, months, and  beyond. $30 per couple °Caring for Baby °This class is for expectant and adoptive parents who want to learn and practice the most up-to-date newborn care for their babies. Focus is on birth through first six weeks of life. Topics include feeding, bathing, diapering, crying, umbilical cord care, circumcision care and safe sleep. Parents learn how to recognize symptoms of illness and when to call the pediatrician. Register only the mom-to-be and your partner can plan to come with you. (*Note: This class is included in the Birth & Baby series and the Weekend Birth & Baby classes.) $10 per couple °Comfort Techniques & Tour °This 2-hour interactive class is designed for those who either do not wish to take the Birth & Baby series or for those who prefer our online childbirth class, but don't want to miss the opportunity to learn and practice hands-on techniques. These skills can help relieve some of the discomfort of labor and encourage your baby to rotate toward the best position for birth. You and your partner will be able to try a variety of labor positions with birth balls and rebozos as well as practice breathing, relaxation, and visual techniques. $20 per couple °Daddy Boot Camp °This course offers Dads-to-be the tools and knowledge needed to feel confident on their journey to becoming new fathers. Experienced dads, who have been trained as coaches, teach dads-to-be how to hold, comfort, diapers, swaddle and play with their infant while being able to support the new mom as well. $25 °Grandparent Love °Expecting a grandbaby? Learn about the latest infant care and safety recommendations and ways to support your own child as he or she transitions into the parenting role. $10 per person °Infant and Child CPR °Parents, grandparents, babysitters, and friends learn Cardio-Pulmonary Resuscitation skills for infants and children. You will also learn how to treat both conscious and unconscious choking infants and children.  Register each participant individually. (Note: This Family & Friends program does not offer certification.) $20 per person °Marvelous Multiples °Expecting twins, triplets, or more? This free 2-hour class covers the differences in labor, birth, parenting, and breastfeeding issues that face multiples' parents.  °Maternity Care Center Virtual Tour ° Online virtual tour of the new [facility] [place] ° °Mom Talk °This free mom-led group offers support and connection to mothers as they journey through the adjustments and struggles of that sometimes overwhelming first year after the birth of a child. A member of our staff will be present to share resources and additional support if needed, as you care for yourself and baby. You are welcome to visit this group before you deliver! It's wonderful to meet new friends early and watch other moms interact with their babies.  °Waterbirth Class °Interested in a waterbirth? This free informational class will help you discover whether   waterbirth is the right fit for you and is required if you are planning a waterbirth. Education about waterbirth itself, supplies you may need, and what you may need from your support team is included in this class. Partners are encouraged to come. °  ° °

## 2018-09-21 NOTE — Addendum Note (Signed)
Addended by: Linton Rump on: 09/21/2018 10:46 AM   Modules accepted: Orders

## 2018-09-22 ENCOUNTER — Other Ambulatory Visit: Payer: Self-pay | Admitting: Obstetrics & Gynecology

## 2018-09-22 LAB — GLUCOSE TOLERANCE, 2 HOURS W/ 1HR
Glucose, 1 hour: 139 mg/dL (ref 65–179)
Glucose, 2 hour: 115 mg/dL (ref 65–152)
Glucose, Fasting: 81 mg/dL (ref 65–91)

## 2018-09-22 LAB — CBC
Hematocrit: 34.2 % (ref 34.0–46.6)
Hemoglobin: 11.3 g/dL (ref 11.1–15.9)
MCH: 30.7 pg (ref 26.6–33.0)
MCHC: 33 g/dL (ref 31.5–35.7)
MCV: 93 fL (ref 79–97)
Platelets: 309 10*3/uL (ref 150–450)
RBC: 3.68 x10E6/uL — ABNORMAL LOW (ref 3.77–5.28)
RDW: 11.8 % (ref 11.7–15.4)
WBC: 8.6 10*3/uL (ref 3.4–10.8)

## 2018-09-22 LAB — HIV ANTIBODY (ROUTINE TESTING W REFLEX): HIV Screen 4th Generation wRfx: NONREACTIVE

## 2018-09-22 LAB — ANTIBODY SCREEN: Antibody Screen: NEGATIVE

## 2018-09-22 LAB — RPR: RPR Ser Ql: NONREACTIVE

## 2018-10-02 LAB — PROTHROMBIN GENE MUTATION

## 2018-10-03 ENCOUNTER — Other Ambulatory Visit: Payer: Self-pay | Admitting: Women's Health

## 2018-10-03 MED ORDER — HYDROCORTISONE ACETATE 25 MG RE SUPP: 25.0000 mg | Freq: Two times a day (BID) | RECTAL | 0 refills | Status: DC

## 2018-10-08 ENCOUNTER — Telehealth: Payer: Self-pay | Admitting: *Deleted

## 2018-10-08 ENCOUNTER — Telehealth: Payer: Self-pay | Admitting: Women's Health

## 2018-10-08 NOTE — Telephone Encounter (Signed)
Patient states she thinks her hemorrhoid has gotten bigger.  She has used the medications prescribed but is having a very hard time sitting and even laying down.  Informed patient the hemorrhoid may be thrombosed and may need to be lanced.  Will w/i with Dr Elonda Husky tomorrow for evaluation. Pt verbalized understanding.

## 2018-10-08 NOTE — Telephone Encounter (Signed)
Pt states that the meds or home remedies are helping her prolapsed hemorrhoid and she is wanting to see what she should do.

## 2018-10-09 ENCOUNTER — Ambulatory Visit (INDEPENDENT_AMBULATORY_CARE_PROVIDER_SITE_OTHER): Payer: BC Managed Care – PPO | Admitting: Obstetrics & Gynecology

## 2018-10-09 ENCOUNTER — Other Ambulatory Visit: Payer: Self-pay

## 2018-10-09 VITALS — BP 141/72 | HR 84 | Wt 195.0 lb

## 2018-10-09 DIAGNOSIS — K645 Perianal venous thrombosis: Secondary | ICD-10-CM

## 2018-10-09 NOTE — Progress Notes (Signed)
Incision Thrombosed Hemorrhoid Procedure Note  Pre-operative Diagnosis: Thrombosed external hemorrhoid  Post-operative Diagnosis: Thrombosed external hemorrhoid  Locations:anus anterior  Indications: Painful external hemorrhoid. Explained surgical vs conservative treatment; surgical incision and drainage of clot usually works quickly to reduce pain and shorten healing time, but is uncomfortable to perform.  After discussion, the patient wishes to proceed with this procedure.  Anesthesia:  Spray local  Procedure Details  After adequate anesthesia, an elliptical incision was made, and the visible clot was extruded with curved hemostat. This was well tolerated. Bulky dressing is applied.  Complications: none.  Plan: 1. Very frequent Sitz baths. 2. Colace tid prn and increase fluids to prevent constipation.   3. Call or return to clinic prn if these symptoms worsen or fail to improve as anticipated  FHR 142 No pregnancy issues

## 2018-10-19 ENCOUNTER — Telehealth: Payer: Self-pay | Admitting: Obstetrics and Gynecology

## 2018-10-19 ENCOUNTER — Telehealth (INDEPENDENT_AMBULATORY_CARE_PROVIDER_SITE_OTHER): Payer: BC Managed Care – PPO | Admitting: Women's Health

## 2018-10-19 ENCOUNTER — Other Ambulatory Visit: Payer: Self-pay

## 2018-10-19 ENCOUNTER — Encounter: Payer: Self-pay | Admitting: Women's Health

## 2018-10-19 VITALS — BP 130/79 | HR 110 | Ht 65.0 in | Wt 195.0 lb

## 2018-10-19 DIAGNOSIS — Z3483 Encounter for supervision of other normal pregnancy, third trimester: Secondary | ICD-10-CM

## 2018-10-19 DIAGNOSIS — Z3A31 31 weeks gestation of pregnancy: Secondary | ICD-10-CM

## 2018-10-19 NOTE — Progress Notes (Signed)
   TELEHEALTH VIRTUAL OBSTETRICS VISIT ENCOUNTER NOTE Patient name: Cheyenne Bauer MRN [id number]  Date of birth: 27-Nov-1990  I connected with patient on 26 10/19/18 at 10:15 AM EDT by telephone (unable to do mychart video) and verified that I am speaking with the correct person using two identifiers. Due to COVID-19 recommendations, pt is not currently in our office.    I discussed the limitations, risks, security and privacy concerns of performing an evaluation and management service by telephone and the availability of in person appointments. I also discussed with the patient that there may be a patient responsible charge related to this service. The patient expressed understanding and agreed to proceed.  Chief Complaint:   Routine Prenatal Visit  History of Present Illness:   Cheyenne Bauer is a 28 y.o. G42P0020 female at [redacted]w[redacted]d with an Estimated Date of Delivery: 12/21/18 being evaluated today for ongoing management of a low-risk pregnancy.  Today she reports went to [facility] last week and they said baby looked 2 weeks ahead of what she should be. States all of her family has big babies, her dad was 12lbs, she was 9lbs. Hemorrhoids feeling much better.  .  .   . denies leaking of fluid. Review of Systems:   Pertinent items are noted in HPI Denies abnormal vaginal discharge w/ itching/odor/irritation, headaches, visual changes, shortness of breath, chest pain, abdominal pain, severe nausea/vomiting, or problems with urination or bowel movements unless otherwise stated above. Pertinent History Reviewed:  Reviewed past medical,surgical, social, obstetrical and family history.  Reviewed problem list, medications and allergies. Physical Assessment:   Vitals:   10/19/18 1034  BP: 130/79  Pulse: (!) 110  Weight: 195 lb (88.5 kg)  Height: 5\' 5"  (1.651 m)  Body mass index is 32.45 kg/m.        Physical Examination:   General:  Alert, oriented and cooperative.   Mental Status: Normal mood and  affect perceived. Normal judgment and thought content.  Rest of physical exam deferred due to type of encounter  No results found for this or any previous visit (from the past 24 hour(s)).  Assessment & Plan:  1) Pregnancy G3P0020 at [redacted]w[redacted]d with an Estimated Date of Delivery: 12/21/18   2) Possible LGA> will get EFW @ 36wks   Meds: No orders of the defined types were placed in this encounter.   Labs/procedures today: none  Plan:  Continue routine obstetrical care.  Haas home bp cuff.  Check bp weekly, let us know if >140/90.   Reviewed: Preterm labor symptoms and general obstetric precautions including but not limited to vaginal bleeding, contractions, leaking of fluid and fetal movement were reviewed in detail with the patient. The patient was advised to call back or seek an in-person office evaluation/go to [facility] at [facility] for any urgent or concerning symptoms. All questions were answered. Please refer to After Visit Summary for other counseling recommendations.    I provided 10 minutes of non-face-to-face time during this encounter.  Follow-up: Return in about 2 weeks (around 11/02/2018) for [place].  No orders of the defined types were placed in this encounter.  Benedict, [facility] 10/19/2018 10:48 AM

## 2018-10-19 NOTE — Patient Instructions (Signed)
Cheyenne Bauer, I greatly value your feedback.  If you receive a survey following your visit with [place] today, we appreciate you taking the time to fill it out.  Thanks, Knute Neu, CNM, [facility]  [facility]!!! It is now [place] [facility] ([place], [place] [address]) Entrance located off of [address] parking    Go to [email or web address] to register for FREE online childbirth classes   Call the office [phone]) or go to [facility] if:  You begin to have strong, frequent contractions  Your water breaks.  Sometimes it is a big gush of fluid, sometimes it is just a trickle that keeps getting your panties wet or running down your legs  You have vaginal bleeding.  It is normal to have a small amount of spotting if your cervix was checked.   You don't feel your baby moving like normal.  If you don't, get you something to eat and drink and lay down and focus on feeling your baby move.  You should feel at least 10 movements in 2 hours.  If you don't, you should call the office or go to [facility].    Tdap Vaccine  It is recommended that you get the Tdap vaccine during the third trimester of EACH pregnancy to help protect your baby from getting pertussis (whooping cough)  27-36 weeks is the BEST time to do this so that you can pass the protection on to your baby. During pregnancy is better than after pregnancy, but if you are unable to get it during pregnancy it will be offered at the hospital.   You can get this vaccine with [place], at the health department, your family doctor, or some local pharmacies  Everyone who will be around your baby should also be up-to-date on their vaccines before the baby comes. Adults (who are not pregnant) only need 1 dose of Tdap during adulthood.   [place] Pediatricians/Family Doctors:  [facility] [phone]                  [place] [phone] (usually not accepting new patients unless you have family there already, you are always welcome to call and ask)       [facility] Department [phone]       [facility] Pediatricians/Family Doctors:   [facility]: [phone]  [facility]: [phone]  [facility]: [place] Doctors:   [facility]: [facility]: [place]:  [place]: [phone]   Home Blood Pressure Monitoring for Patients   Your provider has recommended that you check your blood pressure (BP) at least once a week at home. If you do not have a blood pressure cuff at home, one will be provided for you. Contact your provider if you have not received your monitor within 1 week.   Helpful Tips for Accurate Home Blood Pressure Checks  . Don't smoke, exercise, or drink caffeine 30 minutes before checking your BP . Use the restroom before checking your BP (a full bladder can raise your pressure) . Relax in a comfortable upright chair . Feet on the ground . Left arm resting comfortably on a flat surface at the level of your heart . Legs uncrossed . Back supported . Sit quietly and don't  talk . Place the cuff on your bare arm . Adjust snuggly, so that only two fingertips can fit between your skin and the top of the cuff . Check 2 readings separated by at least one minute . Keep a log of your BP readings . For a visual, please reference this diagram: http://ccnc.care/bpdiagram  Provider Name: [facility]     Phone: [phone]  Zone 1: ALL CLEAR  Continue to monitor your symptoms:  . BP reading is less than 140 (top number) or less than 90 (bottom number)  . No right upper stomach pain . No headaches or seeing spots . No feeling nauseated or throwing up . No swelling in face and  hands  Zone 2: CAUTION Call your doctor's office for any of the following:  . BP reading is greater than 140 (top number) or greater than 90 (bottom number)  . Stomach pain under your ribs in the middle or right side . Headaches or seeing spots . Feeling nauseated or throwing up . Swelling in face and hands  Zone 3: EMERGENCY  Seek immediate medical care if you have any of the following:  . BP reading is greater than160 (top number) or greater than 110 (bottom number) . Severe headaches not improving with Tylenol . Serious difficulty catching your breath . Any worsening symptoms from Zone 2   Third Trimester of Pregnancy The third trimester is from week 29 through week 42, months 7 through 9. The third trimester is a time when the fetus is growing rapidly. At the end of the ninth month, the fetus is about 20 inches in length and weighs 6-10 pounds.  BODY CHANGES Your body goes through many changes during pregnancy. The changes vary from woman to woman.   Your weight will continue to increase. You can expect to gain 25-35 pounds (11-16 kg) by the end of the pregnancy.  You may begin to get stretch marks on your hips, abdomen, and breasts.  You may urinate more often because the fetus is moving lower into your pelvis and pressing on your bladder.  You may develop or continue to have heartburn as a result of your pregnancy.  You may develop constipation because certain hormones are causing the muscles that push waste through your intestines to slow down.  You may develop hemorrhoids or swollen, bulging veins (varicose veins).  You may have pelvic pain because of the weight gain and pregnancy hormones relaxing your joints between the bones in your pelvis. Backaches may result from overexertion of the muscles supporting your posture.  You may have changes in your hair. These can include thickening of your hair, rapid growth, and changes in texture. Some women also have hair loss during  or after pregnancy, or hair that feels dry or thin. Your hair will most likely return to normal after your baby is born.  Your breasts will continue to grow and be tender. A yellow discharge may leak from your breasts called colostrum.  Your belly button may stick out.  You may feel short of breath because of your expanding uterus.  You may notice the fetus "dropping," or moving lower in your abdomen.  You may have a bloody mucus discharge. This usually occurs a few days to a week before labor begins.  Your cervix becomes thin and soft (effaced) near your due date. WHAT TO EXPECT AT YOUR PRENATAL EXAMS  You will have prenatal exams every 2 weeks until week 36. Then, you will have weekly prenatal  exams. During a routine prenatal visit:  You will be weighed to make sure you and the fetus are growing normally.  Your blood pressure is taken.  Your abdomen will be measured to track your baby's growth.  The fetal heartbeat will be listened to.  Any test results from the previous visit will be discussed.  You may have a cervical check near your due date to see if you have effaced. At around 36 weeks, your caregiver will check your cervix. At the same time, your caregiver will also perform a test on the secretions of the vaginal tissue. This test is to determine if a type of bacteria, Group B streptococcus, is present. Your caregiver will explain this further. Your caregiver may ask you:  What your birth plan is.  How you are feeling.  If you are feeling the baby move.  If you have had any abnormal symptoms, such as leaking fluid, bleeding, severe headaches, or abdominal cramping.  If you have any questions. Other tests or screenings that may be performed during your third trimester include:  Blood tests that check for low iron levels (anemia).  Fetal testing to check the health, activity level, and growth of the fetus. Testing is done if you have certain medical conditions or if  there are problems during the pregnancy. FALSE LABOR You may feel small, irregular contractions that eventually go away. These are called Braxton Hicks contractions, or false labor. Contractions may last for hours, days, or even weeks before true labor sets in. If contractions come at regular intervals, intensify, or become painful, it is best to be seen by your caregiver.  SIGNS OF LABOR   Menstrual-like cramps.  Contractions that are 5 minutes apart or less.  Contractions that start on the top of the uterus and spread down to the lower abdomen and back.  A sense of increased pelvic pressure or back pain.  A watery or bloody mucus discharge that comes from the vagina. If you have any of these signs before the 37th week of pregnancy, call your caregiver right away. You need to go to the hospital to get checked immediately. HOME CARE INSTRUCTIONS   Avoid all smoking, herbs, alcohol, and unprescribed drugs. These chemicals affect the formation and growth of the baby.  Follow your caregiver's instructions regarding medicine use. There are medicines that are either safe or unsafe to take during pregnancy.  Exercise only as directed by your caregiver. Experiencing uterine cramps is a good sign to stop exercising.  Continue to eat regular, healthy meals.  Wear a good support bra for breast tenderness.  Do not use hot tubs, steam rooms, or saunas.  Wear your seat belt at all times when driving.  Avoid raw meat, uncooked cheese, cat litter boxes, and soil used by cats. These carry germs that can cause birth defects in the baby.  Take your prenatal vitamins.  Try taking a stool softener (if your caregiver approves) if you develop constipation. Eat more high-fiber foods, such as fresh vegetables or fruit and whole grains. Drink plenty of fluids to keep your urine clear or pale yellow.  Take warm sitz baths to soothe any pain or discomfort caused by hemorrhoids. Use hemorrhoid cream if your  caregiver approves.  If you develop varicose veins, wear support hose. Elevate your feet for 15 minutes, 3-4 times a day. Limit salt in your diet.  Avoid heavy lifting, wear low heal shoes, and practice good posture.  Rest a lot with your legs elevated  if you have leg cramps or low back pain.  Visit your dentist if you have not gone during your pregnancy. Use a soft toothbrush to brush your teeth and be gentle when you floss.  A sexual relationship may be continued unless your caregiver directs you otherwise.  Do not travel far distances unless it is absolutely necessary and only with the approval of your caregiver.  Take prenatal classes to understand, practice, and ask questions about the labor and delivery.  Make a trial run to the hospital.  Pack your hospital bag.  Prepare the baby's nursery.  Continue to go to all your prenatal visits as directed by your caregiver. SEEK MEDICAL CARE IF:  You are unsure if you are in labor or if your water has broken.  You have dizziness.  You have mild pelvic cramps, pelvic pressure, or nagging pain in your abdominal area.  You have persistent nausea, vomiting, or diarrhea.  You have a bad smelling vaginal discharge.  You have pain with urination. SEEK IMMEDIATE MEDICAL CARE IF:   You have a fever.  You are leaking fluid from your vagina.  You have spotting or bleeding from your vagina.  You have severe abdominal cramping or pain.  You have rapid weight loss or gain.  You have shortness of breath with chest pain.  You notice sudden or extreme swelling of your face, hands, ankles, feet, or legs.  You have not felt your baby move in over an hour.  You have severe headaches that do not go away with medicine.  You have vision changes. Document Released: 02/08/2001 Document Revised: 02/19/2013 Document Reviewed: 04/17/2012 [facility] Patient Information 2015 [place], [place]. This information is not intended to replace advice  given to you by your health care provider. Make sure you discuss any questions you have with your health care provider.

## 2018-10-19 NOTE — Telephone Encounter (Signed)
This patient is in my workqueue for a lab order that Dr. Glo Herring made.  I don't know how to approve it for it to drop where it needs to.  I tried, but it's not letting me.  It's acting like the other ones we had for nutrition referrals.  Will you take a look when you get a chance.

## 2018-10-22 NOTE — Telephone Encounter (Signed)
I do not think I can delete it.  I've tried and I do not see a way to do that.  I think the person who put it in will have to delete it.

## 2018-10-31 ENCOUNTER — Telehealth: Payer: Self-pay | Admitting: *Deleted

## 2018-10-31 NOTE — Telephone Encounter (Signed)
Patient states she went to her pharmacy to get a refill on her Cheyenne Bauer but was told she needed a copay card.  Informed patient we did not have any in our office at this time but she could go to the website and download it.  Pt states she can do that and will take it to her pharmacy.

## 2018-11-01 ENCOUNTER — Ambulatory Visit (INDEPENDENT_AMBULATORY_CARE_PROVIDER_SITE_OTHER): Payer: BC Managed Care – PPO | Admitting: Obstetrics and Gynecology

## 2018-11-01 ENCOUNTER — Other Ambulatory Visit: Payer: Self-pay

## 2018-11-01 VITALS — BP 123/70 | HR 88 | Wt 198.6 lb

## 2018-11-01 DIAGNOSIS — Z3A32 32 weeks gestation of pregnancy: Secondary | ICD-10-CM

## 2018-11-01 DIAGNOSIS — Z3483 Encounter for supervision of other normal pregnancy, third trimester: Secondary | ICD-10-CM

## 2018-11-01 NOTE — Progress Notes (Signed)
Patient ID: Cheyenne Bauer, female   DOB: [birth year], 28 y.o.   MRN: [id number]   LOW-RISK PREGNANCY VISIT Patient name: Cheyenne Bauer MRN [id number]  Date of birth: 07/10/1990 Chief Complaint:   Routine Prenatal Visit  History of Present Illness:   Cheyenne Bauer is a 28 y.o. G65P0020 female at [redacted]w[redacted]d with an Estimated Date of Delivery: 12/21/18 being seen today for ongoing management of a low-risk pregnancy.  Today she reports no complaints.   Contractions: Not present. Vag. Bleeding: None.  Movement: Present. denies leaking of fluid. Review of Systems:   Pertinent items are noted in HPI Denies abnormal vaginal discharge w/ itching/odor/irritation, headaches, visual changes, shortness of breath, chest pain, abdominal pain, severe nausea/vomiting, or problems with urination or bowel movements unless otherwise stated above. Pertinent History Reviewed:  Reviewed past medical,surgical, social, obstetrical and family history.  Reviewed problem list, medications and allergies. Physical Assessment:   Vitals:   11/01/18 1141  BP: 123/70  Pulse: 88  Weight: 198 lb 9.6 oz (90.1 kg)  Body mass index is 33.05 kg/m.        Physical Examination:   General appearance: Well appearing, and in no distress  Mental status: Alert, oriented to person, place, and time  Skin: Warm & dry  Cardiovascular: Normal heart rate noted  Respiratory: Normal respiratory effort, no distress  Abdomen: Soft, gravid, nontender  Pelvic: Cervical exam deferred         Extremities: Edema: Trace  Fetal Status: Fetal Heart Rate (bpm): 133 Fundal Height: 33 cm Movement: Present    No results found for this or any previous visit (from the past 24 hour(s)).  Assessment & Plan:  1) Low-risk pregnancy G3P0020 at [redacted]w[redacted]d with an Estimated Date of Delivery: 12/21/18   2) Possible LGA>> will get EFW @ 36wks   Plan: Continue routine obstetrical care 1) F/U in 2 weeks for LROB by phone 2) F/U in 3 weeks for GBS and [place]  Meds:  No orders of the defined types were placed in this encounter.  Labs/procedures today: none  Follow-up: Return in about 2 weeks (around 11/15/2018) for by phone, LROB. by phone  No orders of the defined types were placed in this encounter.  By signing my name below, I, De Burrs, attest that this documentation has been prepared under the direction and in the presence of Jonnie Kind, MD. Electronically Signed: De Burrs, Medical Scribe. 11/01/18. 11:59 AM.  I personally performed the services described in this documentation, which was SCRIBED in my presence. The recorded information has been reviewed and considered accurate. It has been edited as necessary during review. Jonnie Kind, MD

## 2018-11-15 ENCOUNTER — Encounter: Payer: Self-pay | Admitting: Family Medicine

## 2018-11-15 ENCOUNTER — Telehealth (INDEPENDENT_AMBULATORY_CARE_PROVIDER_SITE_OTHER): Payer: BC Managed Care – PPO | Admitting: Family Medicine

## 2018-11-15 VITALS — BP 131/86

## 2018-11-15 DIAGNOSIS — Z2839 Other underimmunization status: Secondary | ICD-10-CM

## 2018-11-15 DIAGNOSIS — O9989 Other specified diseases and conditions complicating pregnancy, childbirth and the puerperium: Secondary | ICD-10-CM

## 2018-11-15 DIAGNOSIS — Z3483 Encounter for supervision of other normal pregnancy, third trimester: Secondary | ICD-10-CM

## 2018-11-15 DIAGNOSIS — Z283 Underimmunization status: Secondary | ICD-10-CM

## 2018-11-15 DIAGNOSIS — Z3A34 34 weeks gestation of pregnancy: Secondary | ICD-10-CM

## 2018-11-15 NOTE — Progress Notes (Signed)
I connected with@ on 11/15/18 at 10:10 AM EDT by: Mychart and verified that I am speaking with the correct person using two identifiers.  Patient is located at home and provider is located at [facility].     The purpose of this virtual visit is to provide medical care while limiting exposure to the novel coronavirus. I discussed the limitations, risks, security and privacy concerns of performing an evaluation and management service by mychart and the availability of in person appointments. I also discussed with the patient that there may be a patient responsible charge related to this service. By engaging in this virtual visit, you consent to the provision of healthcare.  Additionally, you authorize for your insurance to be billed for the services provided during this visit.  The patient expressed understanding and agreed to proceed.  The following staff members participated in the virtual visit:  [facility] LPN      PRENATAL VISIT NOTE  Subjective:  Cheyenne Bauer is a 28 y.o. G3P0020 at [redacted]w[redacted]d for phone visit for ongoing prenatal care.  She is currently monitored for the following issues for this low-risk pregnancy and has Depression; Supervision of normal pregnancy; Hyperemesis; and Rubella non-immune status, antepartum on their problem list.  Patient reports mild edema in lower extremities.  Contractions: Not present. Vag. Bleeding: None.  Movement: Present. Denies leaking of fluid.   The following portions of the patient's history were reviewed and updated as appropriate: allergies, current medications, past family history, past medical history, past social history, past surgical history and problem list.   Objective:   Vitals:   11/15/18 1022  BP: 131/86   Self-Obtained  Fetal Status:     Movement: Present     Assessment and Plan:  Pregnancy: G3P0020 at 366w6d. Encounter for supervision of other normal pregnancy in third trimester Up to date Having some swelling but negative ROS for  preeclampsia Reviewed home BP and s/sx of preX and need for in person visit if concerning BP or developing sx.  Discussed support during labor Reviewed next visit in 1 week for 36 wk labs   2. Rubella non-immune status, antepartum MMR pp  Preterm labor symptoms and general obstetric precautions including but not limited to vaginal bleeding, contractions, leaking of fluid and fetal movement were reviewed in detail with the patient.  Return in about 1 week (around 11/22/2018) for Routine prenatal care, 36wks.  No future appointments.   Time spent on virtual visit: 15 minutes  KiCaren MacadamMD

## 2018-11-23 ENCOUNTER — Other Ambulatory Visit: Payer: Self-pay

## 2018-11-23 ENCOUNTER — Other Ambulatory Visit: Payer: Self-pay | Admitting: Obstetrics and Gynecology

## 2018-11-23 ENCOUNTER — Ambulatory Visit (INDEPENDENT_AMBULATORY_CARE_PROVIDER_SITE_OTHER): Payer: BC Managed Care – PPO | Admitting: Internal Medicine

## 2018-11-23 ENCOUNTER — Other Ambulatory Visit (INDEPENDENT_AMBULATORY_CARE_PROVIDER_SITE_OTHER): Payer: BC Managed Care – PPO

## 2018-11-23 VITALS — BP 133/87 | HR 108 | Wt 212.0 lb

## 2018-11-23 DIAGNOSIS — Z331 Pregnant state, incidental: Secondary | ICD-10-CM

## 2018-11-23 DIAGNOSIS — Z3A36 36 weeks gestation of pregnancy: Secondary | ICD-10-CM

## 2018-11-23 DIAGNOSIS — Z3483 Encounter for supervision of other normal pregnancy, third trimester: Secondary | ICD-10-CM | POA: Diagnosis not present

## 2018-11-23 DIAGNOSIS — Z3403 Encounter for supervision of normal first pregnancy, third trimester: Secondary | ICD-10-CM

## 2018-11-23 DIAGNOSIS — O26843 Uterine size-date discrepancy, third trimester: Secondary | ICD-10-CM

## 2018-11-23 DIAGNOSIS — Z1389 Encounter for screening for other disorder: Secondary | ICD-10-CM

## 2018-11-23 DIAGNOSIS — Z23 Encounter for immunization: Secondary | ICD-10-CM

## 2018-11-23 LAB — POCT URINALYSIS DIPSTICK OB
Blood, UA: NEGATIVE
Glucose, UA: NEGATIVE
Ketones, UA: NEGATIVE
Leukocytes, UA: NEGATIVE
Nitrite, UA: NEGATIVE
POC,PROTEIN,UA: NEGATIVE

## 2018-11-23 NOTE — Progress Notes (Signed)
[place] 36 wks,cephalic,anterior placenta gr 3,afi 11.8 cm,fhr 130 bpm,EFW 2947 g 64%

## 2018-11-23 NOTE — Progress Notes (Signed)
   PRENATAL VISIT NOTE  Subjective:  Cheyenne Bauer is a 28 y.o. G3P0020 at [redacted]w[redacted]d being seen today for ongoing prenatal care.  She is currently monitored for the following issues for this low-risk pregnancy and has Depression; Supervision of normal pregnancy; Hyperemesis; and Rubella non-immune status, antepartum on their problem list.  Patient reports no complaints.  Contractions: Irregular.  .  Movement: Present. Denies leaking of fluid.   The following portions of the patient's history were reviewed and updated as appropriate: allergies, current medications, past family history, past medical history, past social history, past surgical history and problem list.   Objective:   Vitals:   11/23/18 1301  BP: 133/87  Pulse: (!) 108  Weight: 212 lb (96.2 kg)    Fetal Status: Fetal Heart Rate (bpm): 148 Fundal Height: 36 cm Movement: Present  Presentation: Vertex  General:  Alert, oriented and cooperative. Patient is in no acute distress.  Skin: Skin is warm and dry. No rash noted.   Cardiovascular: Normal heart rate noted  Respiratory: Normal respiratory effort, no problems with respiration noted  Abdomen: Soft, gravid, appropriate for gestational age.  Pain/Pressure: Present     Pelvic: Cervical exam performed Dilation: Closed Effacement (%): Thick Station: -3  Extremities: Normal range of motion.  Edema: Trace  Mental Status: Normal mood and affect. Normal behavior. Normal judgment and thought content.   Assessment and Plan:  Pregnancy: G3P0020 at [redacted]w[redacted]d  1. Encounter for supervision of other normal pregnancy in third trimester Continue routine PNC.  Patient is scheduled for repeat ultrasound today.  - Culture, beta strep (group b only) - GC/Chlamydia Probe Amp  2. Pregnant state, incidental - POC Urinalysis Dipstick OB  3. Screening for genitourinary condition Negative UA.  - POC Urinalysis Dipstick OB  4. [redacted] weeks gestation of pregnancy - Tdap vaccine greater than or  equal to 7yo IM    Preterm labor symptoms and general obstetric precautions including but not limited to vaginal bleeding, contractions, leaking of fluid and fetal movement were reviewed in detail with the patient. Please refer to After Visit Summary for other counseling recommendations.   Return in about 1 week (around 11/30/2018) for routine PNC.  No future appointments.  Melina Schools, DO

## 2018-11-27 LAB — GC/CHLAMYDIA PROBE AMP
Chlamydia trachomatis, NAA: NEGATIVE
Neisseria Gonorrhoeae by PCR: NEGATIVE

## 2018-11-27 LAB — CULTURE, BETA STREP (GROUP B ONLY): Strep Gp B Culture: NEGATIVE

## 2018-11-30 ENCOUNTER — Encounter: Payer: Self-pay | Admitting: Women's Health

## 2018-11-30 ENCOUNTER — Other Ambulatory Visit: Payer: Self-pay

## 2018-11-30 ENCOUNTER — Ambulatory Visit (INDEPENDENT_AMBULATORY_CARE_PROVIDER_SITE_OTHER): Payer: BC Managed Care – PPO | Admitting: Women's Health

## 2018-11-30 VITALS — BP 129/84 | HR 119 | Wt 216.0 lb

## 2018-11-30 DIAGNOSIS — Z331 Pregnant state, incidental: Secondary | ICD-10-CM

## 2018-11-30 DIAGNOSIS — Z3A37 37 weeks gestation of pregnancy: Secondary | ICD-10-CM

## 2018-11-30 DIAGNOSIS — Z1389 Encounter for screening for other disorder: Secondary | ICD-10-CM

## 2018-11-30 DIAGNOSIS — Z23 Encounter for immunization: Secondary | ICD-10-CM

## 2018-11-30 DIAGNOSIS — Z3483 Encounter for supervision of other normal pregnancy, third trimester: Secondary | ICD-10-CM

## 2018-11-30 LAB — POCT URINALYSIS DIPSTICK OB
Blood, UA: NEGATIVE
Glucose, UA: NEGATIVE
Ketones, UA: NEGATIVE
Nitrite, UA: NEGATIVE
POC,PROTEIN,UA: NEGATIVE

## 2018-11-30 NOTE — Patient Instructions (Signed)
Cheyenne Bauer, I greatly value your feedback.  If you receive a survey following your visit with [place] today, we appreciate you taking the time to fill it out.  Thanks, Cheyenne Bauer, CNM, [facility]  [place]!!! It is now [facility] at [facility] ([place], [place] [address]) Entrance located off of [address] parking   Go to [email or web address] to register for FREE online childbirth classes    Call the office [phone]) or go to [facility] if:  You begin to have strong, frequent contractions  Your water breaks.  Sometimes it is a big gush of fluid, sometimes it is just a trickle that keeps getting your panties wet or running down your legs  You have vaginal bleeding.  It is normal to have a small amount of spotting if your cervix was checked.   You don't feel your baby moving like normal.  If you don't, get you something to eat and drink and lay down and focus on feeling your baby move.  You should feel at least 10 movements in 2 hours.  If you don't, you should call the office or go to [place] Blood Pressure Monitoring for Patients   Your provider has recommended that you check your blood pressure (BP) at least once a week at home. If you do not have a blood pressure cuff at home, one will be provided for you. Contact your provider if you have not received your monitor within 1 week.   Helpful Tips for Accurate Home Blood Pressure Checks  . Don't smoke, exercise, or drink caffeine 30 minutes before checking your BP . Use the restroom before checking your BP (a full bladder can raise your pressure) . Relax in a comfortable upright chair . Feet on the ground . Left arm resting comfortably on a flat surface at the level of your heart . Legs uncrossed . Back supported . Sit quietly and don't talk . Place the cuff on your bare arm . Adjust snuggly, so that only two fingertips can fit between your skin and  the top of the cuff . Check 2 readings separated by at least one minute . Keep a log of your BP readings . For a visual, please reference this diagram: http://ccnc.care/bpdiagram  Provider Name: [facility]     Phone: [phone]  Zone 1: ALL CLEAR  Continue to monitor your symptoms:  . BP reading is less than 140 (top number) or less than 90 (bottom number)  . No right upper stomach pain . No headaches or seeing spots . No feeling nauseated or throwing up . No swelling in face and hands  Zone 2: CAUTION Call your doctor's office for any of the following:  . BP reading is greater than 140 (top number) or greater than 90 (bottom number)  . Stomach pain under your ribs in the middle or right side . Headaches or seeing spots . Feeling nauseated or throwing up . Swelling in face and hands  Zone 3: EMERGENCY  Seek immediate medical care if you have any of the following:  . BP reading is greater than160 (top number) or greater than 110 (bottom number) . Severe headaches not improving with Tylenol . Serious difficulty catching your breath . Any worsening symptoms from Zone 2

## 2018-11-30 NOTE — Progress Notes (Signed)
   LOW-RISK PREGNANCY VISIT Patient name: Cheyenne Bauer MRN [id number]  Date of birth: 03/18/1990 Chief Complaint:   Routine Prenatal Visit  History of Present Illness:   Cheyenne Bauer is a 28 y.o. G78P0020 female at [redacted]w[redacted]d with an Estimated Date of Delivery: 12/21/18 being seen today for ongoing management of a low-risk pregnancy.  Today she reports just tired. Contractions: Not present. Vag. Bleeding: None.  Movement: Present. denies leaking of fluid. Review of Systems:   Pertinent items are noted in HPI Denies abnormal vaginal discharge w/ itching/odor/irritation, headaches, visual changes, shortness of breath, chest pain, abdominal pain, severe nausea/vomiting, or problems with urination or bowel movements unless otherwise stated above. Pertinent History Reviewed:  Reviewed past medical,surgical, social, obstetrical and family history.  Reviewed problem list, medications and allergies. Physical Assessment:   Vitals:   11/30/18 1307  BP: 129/84  Pulse: (!) 119  Weight: 216 lb (98 kg)  Body mass index is 35.94 kg/m.        Physical Examination:   General appearance: Well appearing, and in no distress  Mental status: Alert, oriented to person, place, and time  Skin: Warm & dry  Cardiovascular: Normal heart rate noted  Respiratory: Normal respiratory effort, no distress  Abdomen: Soft, gravid, nontender  Pelvic: Cervical exam deferred         Extremities: Edema: Trace  Fetal Status: Fetal Heart Rate (bpm): 127 Fundal Height: 36 cm Movement: Present    Results for orders placed or performed in visit on 11/30/18 (from the past 24 hour(s))  POC Urinalysis Dipstick OB   Collection Time: 11/30/18  1:09 PM  Result Value Ref Range   Color, UA     Clarity, UA     Glucose, UA Negative Negative   Bilirubin, UA     Ketones, UA neg    Spec Grav, UA     Blood, UA neg    pH, UA     POC,PROTEIN,UA Negative Negative, Trace, Small (1+), Moderate (2+), Large (3+), 4+   Urobilinogen, UA      Nitrite, UA neg    Leukocytes, UA Trace (A) Negative   Appearance     Odor      Assessment & Plan:  1) Low-risk pregnancy G3P0020 at [redacted]w[redacted]d with an Estimated Date of Delivery: 12/21/18    Meds: No orders of the defined types were placed in this encounter.  Labs/procedures today: flu shot  Plan:  Continue routine obstetrical care   Reviewed: Term labor symptoms and general obstetric precautions including but not limited to vaginal bleeding, contractions, leaking of fluid and fetal movement were reviewed in detail with the patient.  All questions were answered. Has home bp cuff. Check bp weekly, let us know if >140/90.   Follow-up: Return in about 1 week (around 12/07/2018) for LROB, MyChart Video.  Orders Placed This Encounter  Procedures  . POC Urinalysis Dipstick OB   Roma Schanz CNM, [facility] 11/30/2018 1:35 PM

## 2018-12-06 ENCOUNTER — Telehealth (INDEPENDENT_AMBULATORY_CARE_PROVIDER_SITE_OTHER): Payer: BC Managed Care – PPO | Admitting: Advanced Practice Midwife

## 2018-12-06 ENCOUNTER — Encounter: Payer: Self-pay | Admitting: Advanced Practice Midwife

## 2018-12-06 ENCOUNTER — Other Ambulatory Visit: Payer: Self-pay

## 2018-12-06 VITALS — BP 145/94 | HR 80

## 2018-12-06 DIAGNOSIS — O163 Unspecified maternal hypertension, third trimester: Secondary | ICD-10-CM

## 2018-12-06 DIAGNOSIS — Z3483 Encounter for supervision of other normal pregnancy, third trimester: Secondary | ICD-10-CM

## 2018-12-06 DIAGNOSIS — Z3A37 37 weeks gestation of pregnancy: Secondary | ICD-10-CM

## 2018-12-06 NOTE — Progress Notes (Signed)
   TELEHEALTH VIRTUAL OBSTETRICS VISIT ENCOUNTER NOTE  I connected with Cheyenne Bauer on 12/07/18 at  3:30 PM EDT by telephone at home and verified that I am speaking with the correct person using two identifiers.   I discussed the limitations, risks, security and privacy concerns of performing an evaluation and management service by telephone and the availability of in person appointments. I also discussed with the patient that there may be a patient responsible charge related to this service. The patient expressed understanding and agreed to proceed.  Subjective:  Cheyenne Bauer is a 28 y.o. G3P0020 at [redacted]w[redacted]d being followed for ongoing prenatal care.  She is currently monitored for the following issues for this low-risk pregnancy and has Depression; Supervision of normal pregnancy; Hyperemesis; and Rubella non-immune status, antepartum on their problem list.  Patient reports no complaints. Reports fetal movement. Denies any contractions, bleeding or leaking of fluid. Hasn't been checking BP the past week or so.   The following portions of the patient's history were reviewed and updated as appropriate: allergies, current medications, past family history, past medical history, past social history, past surgical history and problem list.   Objective:   General:  Alert, oriented and cooperative.   Mental Status: Normal mood and affect perceived. Normal judgment and thought content.  Rest of physical exam deferred due to type of encounter  Assessment and Plan:  Pregnancy: G3P0020 at [redacted]w[redacted]d 1. Encounter for supervision of other normal pregnancy in third trimester  2. Elevated BPs today--come in tomorrow to office to check. IF meets criteria for GHTN, plan IOL  Term labor symptoms and general obstetric precautions including but not limited to vaginal bleeding, contractions, leaking of fluid and fetal movement were reviewed in detail with the patient.  I discussed the assessment and treatment plan  with the patient. The patient was provided an opportunity to ask questions and all were answered. The patient agreed with the plan and demonstrated an understanding of the instructions. The patient was advised to call back or seek an in-person office evaluation/go to [facility]U at [facility] for any urgent or concerning symptoms. Please refer to After Visit Summary for other counseling recommendations.   I provided 10 minutes of non-face-to-face time during this encounter.  Return for tomorrow fo rBP check.  Future Appointments  Date Time Provider [place]  12/07/2018  8:30 AM [facility] NURSE [facility]    Christin Fudge, CNM [facility] [employer], [place]

## 2018-12-07 ENCOUNTER — Other Ambulatory Visit: Payer: Self-pay

## 2018-12-07 ENCOUNTER — Ambulatory Visit (INDEPENDENT_AMBULATORY_CARE_PROVIDER_SITE_OTHER): Payer: BC Managed Care – PPO | Admitting: *Deleted

## 2018-12-07 ENCOUNTER — Encounter (HOSPITAL_COMMUNITY): Payer: Self-pay

## 2018-12-07 ENCOUNTER — Inpatient Hospital Stay (HOSPITAL_COMMUNITY)
Admission: AD | Admit: 2018-12-07 | Discharge: 2018-12-10 | DRG: 807 | Disposition: A | Discharge status: Home / Self Care | Payer: BC Managed Care – PPO | Attending: Obstetrics & Gynecology | Admitting: Obstetrics & Gynecology

## 2018-12-07 VITALS — BP 130/91

## 2018-12-07 DIAGNOSIS — O134 Gestational [pregnancy-induced] hypertension without significant proteinuria, complicating childbirth: Secondary | ICD-10-CM | POA: Diagnosis not present

## 2018-12-07 DIAGNOSIS — Z2839 Other underimmunization status: Secondary | ICD-10-CM

## 2018-12-07 DIAGNOSIS — O09899 Supervision of other high risk pregnancies, unspecified trimester: Secondary | ICD-10-CM

## 2018-12-07 DIAGNOSIS — F329 Major depressive disorder, single episode, unspecified: Secondary | ICD-10-CM | POA: Diagnosis present

## 2018-12-07 DIAGNOSIS — O133 Gestational [pregnancy-induced] hypertension without significant proteinuria, third trimester: Secondary | ICD-10-CM | POA: Diagnosis not present

## 2018-12-07 DIAGNOSIS — Z412 Encounter for routine and ritual male circumcision: Secondary | ICD-10-CM | POA: Diagnosis not present

## 2018-12-07 DIAGNOSIS — O99344 Other mental disorders complicating childbirth: Secondary | ICD-10-CM | POA: Diagnosis not present

## 2018-12-07 DIAGNOSIS — Z8759 Personal history of other complications of pregnancy, childbirth and the puerperium: Secondary | ICD-10-CM | POA: Diagnosis present

## 2018-12-07 DIAGNOSIS — O139 Gestational [pregnancy-induced] hypertension without significant proteinuria, unspecified trimester: Secondary | ICD-10-CM | POA: Diagnosis present

## 2018-12-07 DIAGNOSIS — O164 Unspecified maternal hypertension, complicating childbirth: Secondary | ICD-10-CM | POA: Diagnosis not present

## 2018-12-07 DIAGNOSIS — Z23 Encounter for immunization: Secondary | ICD-10-CM | POA: Diagnosis not present

## 2018-12-07 DIAGNOSIS — Z1389 Encounter for screening for other disorder: Secondary | ICD-10-CM

## 2018-12-07 DIAGNOSIS — Z87891 Personal history of nicotine dependence: Secondary | ICD-10-CM

## 2018-12-07 DIAGNOSIS — Z3A38 38 weeks gestation of pregnancy: Secondary | ICD-10-CM

## 2018-12-07 DIAGNOSIS — Z20828 Contact with and (suspected) exposure to other viral communicable diseases: Secondary | ICD-10-CM | POA: Diagnosis not present

## 2018-12-07 DIAGNOSIS — F418 Other specified anxiety disorders: Secondary | ICD-10-CM | POA: Diagnosis present

## 2018-12-07 DIAGNOSIS — Z283 Underimmunization status: Secondary | ICD-10-CM

## 2018-12-07 DIAGNOSIS — Z3483 Encounter for supervision of other normal pregnancy, third trimester: Secondary | ICD-10-CM

## 2018-12-07 DIAGNOSIS — Z331 Pregnant state, incidental: Secondary | ICD-10-CM

## 2018-12-07 LAB — COMPREHENSIVE METABOLIC PANEL
ALT: 23 U/L (ref 0–44)
AST: 18 U/L (ref 15–41)
Albumin: 2.7 g/dL — ABNORMAL LOW (ref 3.5–5.0)
Alkaline Phosphatase: 110 U/L (ref 38–126)
Anion gap: 10 (ref 5–15)
BUN: 8 mg/dL (ref 6–20)
CO2: 18 mmol/L — ABNORMAL LOW (ref 22–32)
Calcium: 8.7 mg/dL — ABNORMAL LOW (ref 8.9–10.3)
Chloride: 108 mmol/L (ref 98–111)
Creatinine, Ser: 0.64 mg/dL (ref 0.44–1.00)
GFR calc Af Amer: >60 mL/min (ref >60)
GFR calc non Af Amer: >60 mL/min (ref >60)
Glucose, Bld: 84 mg/dL (ref 70–99)
Potassium: 4.1 mmol/L (ref 3.5–5.1)
Sodium: 136 mmol/L (ref 135–145)
Total Bilirubin: 0.3 mg/dL (ref 0.3–1.2)
Total Protein: 5.8 g/dL — ABNORMAL LOW (ref 6.5–8.1)

## 2018-12-07 LAB — CBC
HCT: 32.9 % — ABNORMAL LOW (ref 36.0–46.0)
Hemoglobin: 11.2 g/dL — ABNORMAL LOW (ref 12.0–15.0)
MCH: 28.6 pg (ref 26.0–34.0)
MCHC: 34 g/dL (ref 30.0–36.0)
MCV: 84.1 fL (ref 80.0–100.0)
Platelets: 283 10*3/uL (ref 150–400)
RBC: 3.91 MIL/uL (ref 3.87–5.11)
RDW: 13.1 % (ref 11.5–15.5)
WBC: 8.2 10*3/uL (ref 4.0–10.5)
nRBC: 0 % (ref 0.0–0.2)

## 2018-12-07 LAB — PROTEIN / CREATININE RATIO, URINE
Creatinine, Urine: 93.59 mg/dL
Protein Creatinine Ratio: 0.15 mg/mg{Cre} (ref 0.00–0.15)
Total Protein, Urine: 14 mg/dL

## 2018-12-07 LAB — URINALYSIS, ROUTINE W REFLEX MICROSCOPIC
Bilirubin Urine: NEGATIVE
Glucose, UA: NEGATIVE mg/dL
Hgb urine dipstick: NEGATIVE
Ketones, ur: NEGATIVE mg/dL
Leukocytes,Ua: NEGATIVE
Nitrite: NEGATIVE
Protein, ur: NEGATIVE mg/dL
Specific Gravity, Urine: 1.02 (ref 1.005–1.030)
pH: 6 (ref 5.0–8.0)

## 2018-12-07 LAB — POCT URINALYSIS DIPSTICK OB
Blood, UA: NEGATIVE
Glucose, UA: NEGATIVE
Ketones, UA: NEGATIVE
Leukocytes, UA: NEGATIVE
Nitrite, UA: NEGATIVE
POC,PROTEIN,UA: NEGATIVE

## 2018-12-07 LAB — SARS CORONAVIRUS 2 BY RT PCR (HOSPITAL ORDER, PERFORMED IN ~~LOC~~ HOSPITAL LAB): SARS Coronavirus 2: NEGATIVE

## 2018-12-07 LAB — TYPE AND SCREEN
ABO/RH(D): A POS
Antibody Screen: NEGATIVE

## 2018-12-07 MED ORDER — LACTATED RINGERS IV SOLN
INTRAVENOUS | Status: DC
  Administered 2018-12-07 – 2018-12-08 (×2): via INTRAVENOUS

## 2018-12-07 MED ORDER — ONDANSETRON HCL 4 MG/2ML IJ SOLN: 4.0000 mg | Freq: Four times a day (QID) | INTRAMUSCULAR | Status: DC | PRN

## 2018-12-07 MED ORDER — OXYCODONE-ACETAMINOPHEN 5-325 MG PO TABS: 2.0000 | ORAL_TABLET | ORAL | Status: DC | PRN

## 2018-12-07 MED ORDER — MISOPROSTOL 50MCG HALF TABLET
50.0000 ug | ORAL_TABLET | ORAL | Status: DC | PRN
  Administered 2018-12-07 – 2018-12-08 (×5): 50 ug via ORAL
  Filled 2018-12-07 (×5): qty 1

## 2018-12-07 MED ORDER — SOD CITRATE-CITRIC ACID 500-334 MG/5ML PO SOLN: 30.0000 mL | ORAL | Status: DC | PRN

## 2018-12-07 MED ORDER — LACTATED RINGERS IV SOLN: 500.0000 mL | INTRAVENOUS | Status: DC | PRN

## 2018-12-07 MED ORDER — ACETAMINOPHEN 325 MG PO TABS
650.0000 mg | ORAL_TABLET | ORAL | Status: DC | PRN
  Administered 2018-12-07: 650 mg via ORAL
  Filled 2018-12-07: qty 2

## 2018-12-07 MED ORDER — OXYTOCIN BOLUS FROM INFUSION
500.0000 mL | Freq: Once | INTRAVENOUS | Status: AC
  Administered 2018-12-08: 500 mL via INTRAVENOUS

## 2018-12-07 MED ORDER — OXYTOCIN 40 UNITS IN NORMAL SALINE INFUSION - SIMPLE MED
2.5000 [IU]/h | INTRAVENOUS | Status: DC
  Administered 2018-12-08: 2.5 [IU]/h via INTRAVENOUS
  Filled 2018-12-07: qty 1000

## 2018-12-07 MED ORDER — OXYCODONE-ACETAMINOPHEN 5-325 MG PO TABS: 1.0000 | ORAL_TABLET | ORAL | Status: DC | PRN

## 2018-12-07 MED ORDER — LIDOCAINE HCL (PF) 1 % IJ SOLN
30.0000 mL | INTRAMUSCULAR | Status: DC | PRN
  Filled 2018-12-07: qty 30

## 2018-12-07 MED ORDER — TERBUTALINE SULFATE 1 MG/ML IJ SOLN: 0.2500 mg | Freq: Once | INTRAMUSCULAR | Status: DC | PRN
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
## 2018-12-07 NOTE — Progress Notes (Signed)
LABOR PROGRESS NOTE  Cheyenne Bauer is a 28 y.o. G3P0020 at [redacted]w[redacted]d  admitted for IOL for gHTN.  Subjective: Patient doing well. Lying comfortably with FOB at bedside. No concerns or complaints.  Objective: BP 129/68   Pulse 81   Temp 98.4 F (36.9 C)   Resp 18   Ht 5\' 5"  (1.651 m)   Wt 98.4 kg   LMP 03/16/2018 (Exact Date)   SpO2 99%   BMI 36.11 kg/m  or  Vitals:   12/07/18 1800 12/07/18 1900 12/07/18 2005 12/07/18 2105  BP: (!) 151/78 (!) 147/88 (!) 152/73 129/68  Pulse: 88 (!) 103 83 81  Resp: 18 18 18 18   Temp:      TempSrc:      SpO2:      Weight:      Height:       Dilation: Closed(closed internal os) Effacement (%): Thick Station: Ballotable Presentation: Vertex Exam by:: Dr. Dione Plover FHT: baseline rate 130, moderate varibility, + acel, no decel Toco: occasional  Labs: Lab Results  Component Value Date   WBC 8.2 12/07/2018   HGB 11.2 (L) 12/07/2018   HCT 32.9 (L) 12/07/2018   MCV 84.1 12/07/2018   PLT 283 12/07/2018    Patient Active Problem List   Diagnosis Date Noted  . Gestational hypertension 12/07/2018  . Elevated BP without diagnosis of hypertension 12/07/2018  . Rubella non-immune status, antepartum 05/15/2018  . Supervision of normal pregnancy 05/14/2018  . Hyperemesis 05/14/2018  . Depression 04/07/2017    Assessment / Plan: 28 y.o. G3P0020 at [redacted]w[redacted]d here for IOL for gHTN.  Labor: s/p Cytotec x 2, last given at 26. Plan to recheck after 4 hours and consider FB if able vs additional cytotec  Fetal Wellbeing:  CAT I Pain Control:  Desires epidural, upon request Anticipated MOD:  NSVD  GHTN: BP range 120-150/70-80. Asympatomatic.  - continue to monitor  [name], D.O. [place], PGY2 12/07/2018, 9:41 PM

## 2018-12-07 NOTE — MAU Note (Signed)
Sent from office for BP evaluation.  Denies H/A or visual disturbances, endorses epigastric pain.  Denies VB or LOF.  Reports +FM.

## 2018-12-07 NOTE — Progress Notes (Addendum)
   NURSE VISIT- BLOOD PRESSURE CHECK  SUBJECTIVE:  Cheyenne Bauer is a 28 y.o. G79P0020 female here for BP check. She is [redacted]w[redacted]d pregnant    HYPERTENSION ROS:  Pregnant/postpartum:  . Severe headaches that don't go away with tylenol/other medicines: No  . Visual changes (seeing spots/double/blurred vision) No  . Severe pain under right breast breast or in center of upper chest No  . Severe nausea/vomiting No  . Taking medicines as instructed not applicable   OBJECTIVE:  LMP 03/16/2018 (Exact Date)   Appearance alert, well appearing, and in no distress and oriented to person, place, and time.  ASSESSMENT: Pregnancy [redacted]w[redacted]d  blood pressure check  PLAN: Discussed with Dr. Elonda Husky   Recommendations: Go to MAU for labs and eval Follow-up: pending MAU eval  Alice Rieger  12/07/2018 8:50 AM   Attestation of Attending Supervision of Nursing Visit Encounter: Evaluation and management procedures were performed by the nursing staff under my supervision and collaboration.  I have reviewed the nurse's note and chart, and I agree with the management and plan.  Jacelyn Grip MD Attending Physician for the Center for [facility] 12/07/2018 9:13 AM

## 2018-12-07 NOTE — MAU Provider Note (Signed)
Patient Cheyenne Bauer is a 28 y.o. G3P0020 At [redacted]w[redacted]d here after having elevated BPs at her prenatal visit yesterday and a follow-up BP check today.  She denies headache, blurry vision, decreased fetal movements, RUQ pain.   She reports the following BPs 10/5: It was elevated at home but she can't remember what it was on the 5th; she knows it was more than 140/90.  10/7: 142/91 (taken at home) 10/8: 147/95 (taken at home while at on prenatal visit Webex) 10/9 : 143/86 and 143/92 at [place] and Plan   1. Gestational hypertension, third trimester    -Plan for admission and IOL -Draw CMP and Pro/creatinine ratio -Dr. Berna Spare to do admission orders   Mervyn Skeeters [facility] 12/07/2018, 12:48 PM

## 2018-12-08 ENCOUNTER — Encounter (HOSPITAL_COMMUNITY): Payer: Self-pay

## 2018-12-08 ENCOUNTER — Inpatient Hospital Stay (HOSPITAL_COMMUNITY): Payer: BC Managed Care – PPO | Admitting: Anesthesiology

## 2018-12-08 DIAGNOSIS — O134 Gestational [pregnancy-induced] hypertension without significant proteinuria, complicating childbirth: Secondary | ICD-10-CM

## 2018-12-08 DIAGNOSIS — O139 Gestational [pregnancy-induced] hypertension without significant proteinuria, unspecified trimester: Secondary | ICD-10-CM

## 2018-12-08 LAB — CBC
HCT: 32.6 % — ABNORMAL LOW (ref 36.0–46.0)
Hemoglobin: 11.3 g/dL — ABNORMAL LOW (ref 12.0–15.0)
MCH: 29.4 pg (ref 26.0–34.0)
MCHC: 34.7 g/dL (ref 30.0–36.0)
MCV: 84.7 fL (ref 80.0–100.0)
Platelets: 287 10*3/uL (ref 150–400)
RBC: 3.85 MIL/uL — ABNORMAL LOW (ref 3.87–5.11)
RDW: 13.1 % (ref 11.5–15.5)
WBC: 10.6 10*3/uL — ABNORMAL HIGH (ref 4.0–10.5)
nRBC: 0 % (ref 0.0–0.2)

## 2018-12-08 LAB — ABO/RH: ABO/RH(D): A POS

## 2018-12-08 LAB — RPR: RPR Ser Ql: NONREACTIVE

## 2018-12-08 MED ORDER — COCONUT OIL OIL
1.0000 "application " | TOPICAL_OIL | Status: DC | PRN
  Administered 2018-12-09: 1 via TOPICAL

## 2018-12-08 MED ORDER — EPHEDRINE 5 MG/ML INJ: 10.0000 mg | INTRAVENOUS | Status: DC | PRN

## 2018-12-08 MED ORDER — WITCH HAZEL-GLYCERIN EX PADS: 1.0000 "application " | MEDICATED_PAD | CUTANEOUS | Status: DC | PRN

## 2018-12-08 MED ORDER — DIPHENHYDRAMINE HCL 25 MG PO CAPS: 25.0000 mg | ORAL_CAPSULE | Freq: Four times a day (QID) | ORAL | Status: DC | PRN

## 2018-12-08 MED ORDER — OXYTOCIN 40 UNITS IN NORMAL SALINE INFUSION - SIMPLE MED
1.0000 m[IU]/min | INTRAVENOUS | Status: DC
  Administered 2018-12-08: 2 m[IU]/min via INTRAVENOUS

## 2018-12-08 MED ORDER — ONDANSETRON HCL 4 MG/2ML IJ SOLN: 4.0000 mg | INTRAMUSCULAR | Status: DC | PRN

## 2018-12-08 MED ORDER — PHENYLEPHRINE 40 MCG/ML (10ML) SYRINGE FOR IV PUSH (FOR BLOOD PRESSURE SUPPORT): 80.0000 ug | PREFILLED_SYRINGE | INTRAVENOUS | Status: DC | PRN

## 2018-12-08 MED ORDER — FENTANYL-BUPIVACAINE-NACL 0.5-0.125-0.9 MG/250ML-% EP SOLN: 12.0000 mL/h | EPIDURAL | Status: DC | PRN

## 2018-12-08 MED ORDER — TETANUS-DIPHTH-ACELL PERTUSSIS 5-2.5-18.5 LF-MCG/0.5 IM SUSP: 0.5000 mL | Freq: Once | INTRAMUSCULAR | Status: DC

## 2018-12-08 MED ORDER — SENNOSIDES-DOCUSATE SODIUM 8.6-50 MG PO TABS
2.0000 | ORAL_TABLET | ORAL | Status: DC
  Administered 2018-12-08 – 2018-12-09 (×2): 2 via ORAL
  Filled 2018-12-08 (×2): qty 2

## 2018-12-08 MED ORDER — ZOLPIDEM TARTRATE 5 MG PO TABS: 5.0000 mg | ORAL_TABLET | Freq: Every evening | ORAL | Status: DC | PRN

## 2018-12-08 MED ORDER — SCOPOLAMINE 1 MG/3DAYS TD PT72
1.0000 | MEDICATED_PATCH | TRANSDERMAL | Status: DC
  Administered 2018-12-08: 1.5 mg via TRANSDERMAL
  Filled 2018-12-08: qty 1

## 2018-12-08 MED ORDER — SIMETHICONE 80 MG PO CHEW: 80.0000 mg | CHEWABLE_TABLET | ORAL | Status: DC | PRN

## 2018-12-08 MED ORDER — PRENATAL MULTIVITAMIN CH
1.0000 | ORAL_TABLET | Freq: Every day | ORAL | Status: DC
  Administered 2018-12-09 – 2018-12-10 (×2): 1 via ORAL
  Filled 2018-12-08 (×2): qty 1

## 2018-12-08 MED ORDER — DIPHENHYDRAMINE HCL 50 MG/ML IJ SOLN: 12.5000 mg | INTRAMUSCULAR | Status: DC | PRN

## 2018-12-08 MED ORDER — LACTATED RINGERS IV SOLN
500.0000 mL | Freq: Once | INTRAVENOUS | Status: AC
  Administered 2018-12-08: 500 mL via INTRAVENOUS

## 2018-12-08 MED ORDER — PHENYLEPHRINE 40 MCG/ML (10ML) SYRINGE FOR IV PUSH (FOR BLOOD PRESSURE SUPPORT)
80.0000 ug | PREFILLED_SYRINGE | INTRAVENOUS | Status: DC | PRN
  Filled 2018-12-08: qty 10

## 2018-12-08 MED ORDER — ACETAMINOPHEN 325 MG PO TABS
650.0000 mg | ORAL_TABLET | ORAL | Status: DC | PRN
  Administered 2018-12-08 – 2018-12-10 (×4): 650 mg via ORAL
  Filled 2018-12-08 (×4): qty 2

## 2018-12-08 MED ORDER — FENTANYL-BUPIVACAINE-NACL 0.5-0.125-0.9 MG/250ML-% EP SOLN
12.0000 mL/h | EPIDURAL | Status: DC | PRN
  Filled 2018-12-08: qty 250

## 2018-12-08 MED ORDER — PROMETHAZINE HCL 25 MG/ML IJ SOLN
25.0000 mg | Freq: Four times a day (QID) | INTRAMUSCULAR | Status: DC | PRN
  Administered 2018-12-08: 25 mg via INTRAVENOUS
  Filled 2018-12-08: qty 1

## 2018-12-08 MED ORDER — ZOLPIDEM TARTRATE 5 MG PO TABS
5.0000 mg | ORAL_TABLET | Freq: Once | ORAL | Status: AC
  Administered 2018-12-08: 5 mg via ORAL
  Filled 2018-12-08: qty 1

## 2018-12-08 MED ORDER — FENTANYL CITRATE (PF) 100 MCG/2ML IJ SOLN
50.0000 ug | INTRAMUSCULAR | Status: DC | PRN
  Administered 2018-12-08: 100 ug via INTRAVENOUS
  Administered 2018-12-08: 50 ug via INTRAVENOUS
  Filled 2018-12-08 (×2): qty 2

## 2018-12-08 MED ORDER — LACTATED RINGERS IV SOLN: 500.0000 mL | Freq: Once | INTRAVENOUS | Status: DC

## 2018-12-08 MED ORDER — SODIUM CHLORIDE (PF) 0.9 % IJ SOLN
INTRAMUSCULAR | Status: DC | PRN
  Administered 2018-12-08: 12 mL/h via EPIDURAL

## 2018-12-08 MED ORDER — DIBUCAINE (PERIANAL) 1 % EX OINT: 1.0000 "application " | TOPICAL_OINTMENT | CUTANEOUS | Status: DC | PRN

## 2018-12-08 MED ORDER — BENZOCAINE-MENTHOL 20-0.5 % EX AERO
1.0000 "application " | INHALATION_SPRAY | CUTANEOUS | Status: DC | PRN
  Administered 2018-12-10: 1 via TOPICAL
  Filled 2018-12-08: qty 56

## 2018-12-08 MED ORDER — LIDOCAINE HCL (PF) 1 % IJ SOLN
INTRAMUSCULAR | Status: DC | PRN
  Administered 2018-12-08: 6 mL via EPIDURAL

## 2018-12-08 MED ORDER — PHENYLEPHRINE 40 MCG/ML (10ML) SYRINGE FOR IV PUSH (FOR BLOOD PRESSURE SUPPORT)
80.0000 ug | PREFILLED_SYRINGE | INTRAVENOUS | Status: DC | PRN
  Administered 2018-12-08: 80 ug via INTRAVENOUS

## 2018-12-08 MED ORDER — IBUPROFEN 600 MG PO TABS
600.0000 mg | ORAL_TABLET | Freq: Four times a day (QID) | ORAL | Status: DC
  Administered 2018-12-08 – 2018-12-10 (×5): 600 mg via ORAL
  Filled 2018-12-08 (×7): qty 1

## 2018-12-08 MED ORDER — ONDANSETRON HCL 4 MG PO TABS: 4.0000 mg | ORAL_TABLET | ORAL | Status: DC | PRN

## 2018-12-08 NOTE — Progress Notes (Signed)
Labor Progress Note Cheyenne Bauer is a 28 y.o. G3P0020 at [redacted]w[redacted]d presented for  IOL for gHTN  S:  Patient reporting 10/10 pain. Crying and requesting epidural  O:  BP (!) 154/86   Pulse 88   Temp 98.1 F (36.7 C) (Oral)   Resp 18   Ht 5\' 5"  (1.651 m)   Wt 98.4 kg   LMP 03/16/2018 (Exact Date)   SpO2 99%   BMI 36.11 kg/m   Fetal Tracing:  Baseline: 140 Variability: moderate Accels: 15x15 Decels: none  Toco: 2-33   CVE: Dilation: 1 Effacement (%): Thick Cervical Position: Middle Station: -3 Presentation: Vertex Exam by:: Derrill Memo, CNM    A&P: 28 y.o. G3P0020 [redacted]w[redacted]d IOL for gHTN #Labor: FB in place. Will get epidural #Pain: epidural #FWB: Cat 1 #GBS negative  Wende Mott, CNM 8:58 AM

## 2018-12-08 NOTE — Progress Notes (Signed)
LABOR PROGRESS NOTE  Cheyenne Bauer is a 28 y.o. G3P0020 at [redacted]w[redacted]d  admitted for IOL for gHTN  Subjective: Patient is doing well at this time.  Reports her epidural is working really well and she can feel very little below her waist.  We checked her cervix as well as AROM. Objective: BP (!) 136/106   Pulse 78   Temp 98.2 F (36.8 C) (Oral)   Resp 18   Ht 5\' 5"  (1.651 m)   Wt 98.4 kg   LMP 03/16/2018 (Exact Date)   SpO2 100%   BMI 36.11 kg/m  or  Vitals:   12/08/18 1330 12/08/18 1400 12/08/18 1435 12/08/18 1500  BP: (!) 104/57 (!) 113/54 110/68 (!) 136/106  Pulse: 80 76 68 78  Resp: 18 16 16 18   Temp:      TempSrc:      SpO2:      Weight:      Height:        Patient is resting comfortably Dilation: 5 Effacement (%): 70 Cervical Position: Middle Station: -3 Presentation: Vertex Exam by:: SRussell, RN  FHT: baseline rate 125, moderate varibility, pos acel, occasional variable decel Toco: 5-6 min  Labs: Lab Results  Component Value Date   WBC 10.6 (H) 12/08/2018   HGB 11.3 (L) 12/08/2018   HCT 32.6 (L) 12/08/2018   MCV 84.7 12/08/2018   PLT 287 12/08/2018    Patient Active Problem List   Diagnosis Date Noted  . Gestational hypertension 12/07/2018  . Rubella non-immune status, antepartum 05/15/2018  . Supervision of normal pregnancy 05/14/2018  . Hyperemesis 05/14/2018  . Depression 04/07/2017    Assessment / Plan: 28 y.o. G3P0020 at [redacted]w[redacted]d here for IOL for gHTN   Labor: Laboring well, AROM  Fetal Wellbeing:  Cat 1  Pain Control:  Epidural in place  Anticipated MOD:  NSVD   gHTN- Bps within normal ranges, continue to monitor   Gifford Shave, MD  PGY-1, [facility]  12/08/2018, 3:35 PM

## 2018-12-08 NOTE — Anesthesia Preprocedure Evaluation (Signed)
Anesthesia Evaluation  Patient identified by MRN, date of birth, ID band Patient awake    Reviewed: Allergy & Precautions, H&P , NPO status , Patient's Chart, lab work & pertinent test results, reviewed documented beta blocker date and time   Airway Mallampati: II  TM Distance: >3 FB Neck ROM: full    Dental no notable dental hx. (+) Teeth Intact, Dental Advisory Given   Pulmonary neg pulmonary ROS, former smoker,    Pulmonary exam normal breath sounds clear to auscultation       Cardiovascular hypertension, Pt. on medications Normal cardiovascular exam Rhythm:regular Rate:Normal     Neuro/Psych negative neurological ROS  negative psych ROS   GI/Hepatic negative GI ROS, Neg liver ROS,   Endo/Other  negative endocrine ROS  Renal/GU negative Renal ROS  negative genitourinary   Musculoskeletal   Abdominal   Peds  Hematology negative hematology ROS (+)   Anesthesia Other Findings   Reproductive/Obstetrics (+) Pregnancy                             Anesthesia Physical Anesthesia Plan  ASA: III  Anesthesia Plan: Epidural   Post-op Pain Management:    Induction:   PONV Risk Score and Plan:   Airway Management Planned:   Additional Equipment:   Intra-op Plan:   Post-operative Plan:   Informed Consent: I have reviewed the patients History and Physical, chart, labs and discussed the procedure including the risks, benefits and alternatives for the proposed anesthesia with the patient or authorized representative who has indicated his/her understanding and acceptance.     Dental Advisory Given  Plan Discussed with:   Anesthesia Plan Comments: (Labs checked- platelets confirmed with RN in room. Fetal heart tracing, per RN, reported to be stable enough for sitting procedure. Discussed epidural, and patient consents to the procedure:  included risk of possible headache,backache, failed  block, allergic reaction, and nerve injury. This patient was asked if she had any questions or concerns before the procedure started.)        Anesthesia Quick Evaluation

## 2018-12-08 NOTE — Anesthesia Postprocedure Evaluation (Signed)
Anesthesia Post Note  Patient: Cheyenne Bauer  Procedure(s) Performed: AN AD HOC LABOR EPIDURAL     Patient location during evaluation: Mother Baby Anesthesia Type: Epidural Level of consciousness: awake and alert Pain management: pain level controlled Vital Signs Assessment: post-procedure vital signs reviewed and stable Respiratory status: spontaneous breathing, nonlabored ventilation and respiratory function stable Cardiovascular status: stable Postop Assessment: no headache, no backache and epidural receding Anesthetic complications: no    Last Vitals:  Vitals:   12/08/18 2002 12/08/18 2017  BP: (!) 148/80 135/72  Pulse: 77 87  Resp: 18 18  Temp:    SpO2:      Last Pain:  Vitals:   12/08/18 2002  TempSrc:   PainSc: 0-No pain                 Paden Kuras

## 2018-12-08 NOTE — Anesthesia Procedure Notes (Signed)
Epidural Patient location during procedure: OB Start time: 12/08/2018 9:39 AM End time: 12/08/2018 9:44 AM  Staffing Anesthesiologist: Janeece Riggers, MD  Preanesthetic Checklist Completed: patient identified, site marked, surgical consent, pre-op evaluation, timeout performed, IV checked, risks and benefits discussed and monitors and equipment checked  Epidural Patient position: sitting Prep: site prepped and draped and DuraPrep Patient monitoring: continuous pulse ox and blood pressure Approach: midline Location: L3-L4 Injection technique: LOR air  Needle:  Needle type: Tuohy  Needle gauge: 17 G Needle length: 9 cm and 9 Needle insertion depth: 5 cm cm Catheter type: closed end flexible Catheter size: 19 Gauge Catheter at skin depth: 10 cm Test dose: negative  Assessment Events: blood not aspirated, injection not painful, no injection resistance, negative IV test and no paresthesia

## 2018-12-08 NOTE — Discharge Summary (Addendum)
Postpartum Discharge Summary     Patient Name: Cheyenne Bauer DOB: 06/01/1990 MRN: [id number]  Date of admission: 12/07/2018 Delivering Provider: Gifford Shave   Date of discharge: 12/10/2018  Admitting diagnosis: 9WKS ELEV BP Intrauterine pregnancy: [redacted]w[redacted]d    Secondary diagnosis:  Active Problems:   Depression   Rubella non-immune status, antepartum   Gestational hypertension  Additional problems: n/a     Discharge diagnosis: Term Pregnancy Delivered                                                                                                Post partum procedures:n/a  Augmentation: AROM, Pitocin, Cytotec and Foley Balloon  Complications: None  Hospital course:  Induction of Labor With Vaginal Delivery   28y.o. yo G3P0020 at 322w1das admitted to the hospital 12/07/2018 for induction of labor.  Indication for induction: Gestational hypertension.  Patient had an uncomplicated labor course as follows: Membrane Rupture Time/Date: 3:26 PM ,12/08/2018   Intrapartum Procedures: Episiotomy: None [1]                                         Lacerations:  2nd degree [3]  Patient had delivery of a Viable infant.  Information for the patient's newborn:  WiTammela, Bales0[[id number]]Delivery Method: Vaginal, Spontaneous(Filed from Delivery Summary)    12/08/2018  Details of delivery can be found in separate delivery note.  Patient had a routine postpartum course. Patient is discharged home 12/10/18. Delivery time: 7:01 PM    Magnesium Sulfate received: No BMZ received: No Rhophylac:No MMR:Yes Transfusion:No  Physical exam  Vitals:   12/09/18 0625 12/09/18 1042 12/09/18 2050 12/10/18 0622  BP: 128/71 132/64 132/76 128/68  Pulse: 74 71 76 71  Resp: '18 18 18 16  ' Temp: (!) 97.4 F (36.3 C) 98.1 F (36.7 C) 98.7 F (37.1 C) 98.4 F (36.9 C)  TempSrc: Oral Oral Oral Oral  SpO2: 95% 99%    Weight:      Height:       General: alert, cooperative and no  distress Lochia: appropriate Uterine Fundus: firm Incision: N/A DVT Evaluation: No evidence of DVT seen on physical exam. Labs: Lab Results  Component Value Date   WBC 11.8 (H) 12/09/2018   HGB 9.6 (L) 12/09/2018   HCT 28.4 (L) 12/09/2018   MCV 85.0 12/09/2018   PLT 260 12/09/2018   CMP Latest Ref Rng & Units 12/07/2018  Glucose 70 - 99 mg/dL 84  BUN 6 - 20 mg/dL 8  Creatinine 0.44 - 1.00 mg/dL 0.64  Sodium 135 - 145 mmol/L 136  Potassium 3.5 - 5.1 mmol/L 4.1  Chloride 98 - 111 mmol/L 108  CO2 22 - 32 mmol/L 18(L)  Calcium 8.9 - 10.3 mg/dL 8.7(L)  Total Protein 6.5 - 8.1 g/dL 5.8(L)  Total Bilirubin 0.3 - 1.2 mg/dL 0.3  Alkaline Phos 38 - 126 U/L 110  AST 15 - 41 U/L 18  ALT 0 - 44 U/L 23  Discharge instruction: per After Visit Summary and "Baby and Me Booklet".  After visit meds:  Allergies as of 12/10/2018      Reactions   Latex Rash      Medication List    STOP taking these medications   Doxylamine-Pyridoxine ER 20-20 MG Tbcr Commonly known as: Bonjesta   pantoprazole 20 MG tablet Commonly known as: PROTONIX   scopolamine 1 MG/3DAYS Commonly known as: TRANSDERM-SCOP     TAKE these medications   acetaminophen 325 MG tablet Commonly known as: Tylenol Take 2 tablets (650 mg total) by mouth every 4 (four) hours as needed (for pain scale < 4). What changed:   medication strength  how much to take  when to take this  reasons to take this   ibuprofen 600 MG tablet Commonly known as: ADVIL Take 1 tablet (600 mg total) by mouth every 6 (six) hours.   Prenatal Vitamins 0.8 MG tablet Take 1 tablet by mouth daily.       Diet: routine diet  Activity: Advance as tolerated. Pelvic rest for 6 weeks.   Outpatient follow up:4 weeks Follow up Appt: Future Appointments  Date Time Provider [place]  12/14/2018 11:50 AM [facility] NURSE [facility]  01/14/2019  1:30 PM Cresenzo-Dishmon, Joaquim Lai, CNM [facility]   Follow up  Visit:  Please schedule this patient for PP visit in: 1 week BP check, 4 weeks Low risk pregnancy complicated by: HTN Delivery mode:  SVD Anticipated Birth Control:  nothing PP Procedures needed: BP check  Schedule Integrated BH visit: no Provider: Any provider   Newborn Data: Live born female  Birth Weight:   APGAR: 71, 9  Newborn Delivery   Birth date/time: 12/08/2018 19:01:00 Delivery type: Vaginal, Spontaneous      Baby Feeding: Breast Disposition:home with mother    I confirm that I have verified the information documented in the resident's note and that I have also personally reperformed the history, physical exam and all medical decision making activities of this service and have verified that all service and findings are accurately documented in this student's note.   Wende Mott, [place] 12/10/2018 7:53 PM

## 2018-12-08 NOTE — Progress Notes (Signed)
LABOR PROGRESS NOTE  Cheyenne Bauer is a 28 y.o. G3P0020 at [redacted]w[redacted]d admitted for  IOL for gHTN  Subjective: STRIP NOTE  Objective: BP 138/86   Pulse 94   Temp 98 F (36.7 C) (Oral)   Resp 16   Ht 5\' 5"  (1.651 m)   Wt 98.4 kg   LMP 03/16/2018 (Exact Date)   SpO2 99%   BMI 36.11 kg/m  or  Vitals:   12/08/18 0101 12/08/18 0200 12/08/18 0305 12/08/18 0401  BP: 125/63 137/71 124/71 138/86  Pulse: 69 97 79 94  Resp: 14 16 16 16   Temp:    98 F (36.7 C)  TempSrc:    Oral  SpO2:      Weight:      Height:        Dilation: Fingertip Effacement (%): 50 Cervical Position: Middle Station: -3 Presentation: Vertex Exam by:: Remus Blake RN FHT: baseline rate 125, moderate varibility, + acel, no decel Toco: q2-3 mins  Labs: Lab Results  Component Value Date   WBC 8.2 12/07/2018   HGB 11.2 (L) 12/07/2018   HCT 32.9 (L) 12/07/2018   MCV 84.1 12/07/2018   PLT 283 12/07/2018    Patient Active Problem List   Diagnosis Date Noted  . Gestational hypertension 12/07/2018  . Elevated BP without diagnosis of hypertension 12/07/2018  . Rubella non-immune status, antepartum 05/15/2018  . Supervision of normal pregnancy 05/14/2018  . Hyperemesis 05/14/2018  . Depression 04/07/2017    Assessment / Plan: 28 y.o. G3P0020 at [redacted]w[redacted]d here for  IOL for gHTN  Labor: Cytotec x 4. Slowly progressing. Plan to place FB when able Fetal Wellbeing:  Cat I Pain Control:  IV pain meds / epidural upon request Anticipated MOD:  NSVD  [name], D.O. [place], PGY2 12/08/2018, 4:29 AM

## 2018-12-08 NOTE — Progress Notes (Signed)
Patient ID: Cheyenne Bauer, female   DOB: [birth year], 28 y.o.   MRN: [id number]  Late entry due to emergent situation with another pt:  S/p cytotec x 5 doses during the evening/night; rested a little  BP 129/87, P 94 FHR 120s, +accels, no decels, Cat 1 Ctx irreg 2-6 mins, mild Cx 1/thick/very soft, vtx -3  IUP@38 .1wks gHTN Cx unfavorable GBS neg  Cervical foley inserted without difficulty and inflated with 60cc fluid Will give another dose of cytotec Plan on Pitocin when the foley comes out  [place] 12/08/2018 8:24 AM

## 2018-12-08 NOTE — Lactation Note (Signed)
This note was copied from a baby's chart. Lactation Consultation Note  Patient Name: Cheyenne Bauer [id number] Date: 12/08/2018 Reason for consult: Initial assessment;Primapara;1st time breastfeeding;Early term 78-38.6wks  3 hours old ETI female who is being exclusively BF by his mother, she's a P1. Mom told LC that baby briefly latched on in L & D but that feeding was not charted in flowsheets. She doesn't remember what time it was, but she noted that baby was sucking, it was a strong suck, but then he fell asleep. No LATCH score recorded. Mom reported (+) breast changes during the pregnancy, she has a [place] DEBP at home.  Offered assistance with latch but mom politely declined, baby was doing STS due to low temperatures. Asked mom to call for assistance when needed. Reviewed hand expression, mom has lots of colostrum, LC showed mom how to spoon feed, baby took at least 1 ml of EBM. Reviewed benefits of STS care, normal newborn behavior, cluster feeding and feeding cues.  Feeding plan:  1. Encouraged mom to feed baby STS 8-12 times/24 hours or sooner if feeding cues are present 2. Hand expression and spoon feeding were also encouraged  BF brochure, BF resources and feeding diary were reviewed. Parents reported all questions and concerns were answered, they're both aware of [facility] OP services and will call PRN.  Maternal Data Formula Feeding for Exclusion: Yes Reason for exclusion: Mother's choice to formula and breast feed on admission Has patient been taught Hand Expression?: Yes Does the patient have breastfeeding experience prior to this delivery?: No  Feeding Feeding Type: Breast Milk  LATCH Score                   Interventions Interventions: Breast feeding basics reviewed;Skin to skin;Hand express;Breast compression;Breast massage;Expressed milk  Lactation Tools Discussed/Used WIC Program: No   Consult Status Consult Status: Follow-up Date: 12/09/18 Follow-up type:  In-patient    Cheyenne Bauer Cheyenne Bauer 12/08/2018, 10:20 PM

## 2018-12-09 LAB — CBC
HCT: 28.4 % — ABNORMAL LOW (ref 36.0–46.0)
Hemoglobin: 9.6 g/dL — ABNORMAL LOW (ref 12.0–15.0)
MCH: 28.7 pg (ref 26.0–34.0)
MCHC: 33.8 g/dL (ref 30.0–36.0)
MCV: 85 fL (ref 80.0–100.0)
Platelets: 260 10*3/uL (ref 150–400)
RBC: 3.34 MIL/uL — ABNORMAL LOW (ref 3.87–5.11)
RDW: 13.2 % (ref 11.5–15.5)
WBC: 11.8 10*3/uL — ABNORMAL HIGH (ref 4.0–10.5)
nRBC: 0 % (ref 0.0–0.2)

## 2018-12-09 NOTE — Anesthesia Postprocedure Evaluation (Signed)
Anesthesia Post Note  Patient: ALISANDRA SON  Procedure(s) Performed: AN AD [place]     Patient location during evaluation: Mother Baby Anesthesia Type: Epidural Level of consciousness: awake and alert Pain management: pain level controlled Vital Signs Assessment: post-procedure vital signs reviewed and stable Respiratory status: spontaneous breathing, nonlabored ventilation and respiratory function stable Cardiovascular status: stable Postop Assessment: no headache, no backache and epidural receding Anesthetic complications: no    Last Vitals:  Vitals:   12/09/18 0204 12/09/18 0625  BP: 129/68 128/71  Pulse: (!) 102 74  Resp: 18 18  Temp: 36.8 C (!) 36.3 C  SpO2: 98% 95%    Last Pain:  Vitals:   12/09/18 0625  TempSrc: Oral  PainSc: 0-No pain   Pain Goal: Patients Stated Pain Goal: (pt states pain has improved) (12/08/18 2230)              Epidural/Spinal Function Cutaneous sensation: Normal sensation (12/09/18 0625), Patient able to flex knees: Yes (12/09/18 0625), Patient able to lift hips off bed: Yes (12/09/18 0625), Back pain beyond tenderness at insertion site: No (12/09/18 0625), Progressively worsening motor and/or sensory loss: No (12/09/18 0625), Bowel and/or bladder incontinence post epidural: No (12/09/18 6433)  Rayvon Char

## 2018-12-09 NOTE — Progress Notes (Signed)
CSW received consult for [name] due to history of depression. CSW met with [name], FOB Thurmond Butts, and newborn [name] at bedside to complete discussion. CSW obtained permission from [name] to speak with FOB present. CSW inquired with [name] about her mental health history and she confirmed the diagnosis above. [name] reports she was diagnosed in 2017 after she and her husband experienced 5 deaths in the same year including close family and a friend. [name] reports she has chosen Dr. Olena Heckle at [facility] in [place] for pediatric care. [name] reports she is not currently on any psychotropic medications and doesn't feel the need for any. [name] and CSW discussed baby blues period versus postpartum depression. [name] states she does not see a therapist and doesn't believe it's needed. [name] reports a good support system outside of the hospital with friends and family. [name] denies any needs or concerns at this time, CSW encouraged [name] to reach out for assistance if needs arise, [name] agreed.  Cheyenne Bauer, MSW, LCSW-A Transitions of Care  Clinical Social Worker  [facility] [employer] [phone]

## 2018-12-09 NOTE — Lactation Note (Addendum)
This note was copied from a baby's chart. Lactation Consultation Note  Patient Name: Cheyenne Bauer [id number] Date: 12/09/2018 Reason for consult: Early term 37-38.6wks;1st time breastfeeding P1, 6 hour ETI female infant -2% weight  Loss. Mom's feeding choice is breast and formula feeding. Per mom, breastfeeding is getting better and mom has been wearing her breast shells due to having flat nipples. Infant is currently cluster feeding tonight. Mom been using coconut oil on her right breast  due abraisons that are now healing, mom feels the coconut oil is soothing and helpful.  Mom did breast stimulation and hand expressed a small amount of colostrum out prior to latching infant on right breast using the football hold. Infant open mouth wide with nose and chin touching, swallows observed and infant was still breastfeeding after 18 minutes when LC left the room. Mom knows to break latch if  Infant's latch feels painful and she is not feeling a tug.  Mom knows to call Nurse or [place] if she has any questions, concerns or need assistance with latching infant to breast. Mom will continue to breastfeed according hunger cues, 8 to 12 times within 24 hours and on demand.  Maternal Data    Feeding    LATCH Score Latch: Grasps breast easily, tongue down, lips flanged, rhythmical sucking.  Audible Swallowing: Spontaneous and intermittent  Type of Nipple: Flat  Comfort (Breast/Nipple): Filling, red/small blisters or bruises, mild/mod discomfort  Hold (Positioning): Assistance needed to correctly position infant at breast and maintain latch.  LATCH Score: 7  Interventions Interventions: Assisted with latch;Adjust position;Support pillows;Skin to skin;Position options;Breast massage;Coconut oil;Shells;Breast compression  Lactation Tools Discussed/Used     Consult Status Consult Status: Follow-up Date: 12/10/18 Follow-up type: In-patient    Vicente Serene 12/09/2018, 10:48 PM

## 2018-12-09 NOTE — Addendum Note (Signed)
Addendum  created 12/09/18 0723 by Rayvon Char, CRNA   Charge Capture section accepted, Clinical Note Signed

## 2018-12-09 NOTE — Lactation Note (Signed)
This note was copied from a baby's chart. Lactation Consultation Note  Patient Name: Boy Datra Clary [id number] Date: 12/09/2018 Reason for consult: Follow-up assessment   Baby 7 hours old and per mother feeding well.  Baby recently bf for 22 min. Mother states she is having difficulty latching on L side. Encouraged her to hand express and preump w/ manual pump before latching and then try. Feed on demand with cues.  Goal 8-12+ times per day after first 24 hrs.  Place baby STS if not cueing.  Call for assistance as needed.    Maternal Data    Feeding Feeding Type: Breast Fed  LATCH Score                   Interventions Interventions: Breast feeding basics reviewed;Hand pump  Lactation Tools Discussed/Used     Consult Status Consult Status: Follow-up Date: 12/10/18 Follow-up type: In-patient    Vivianne Master [facility] 12/09/2018, 10:25 AM

## 2018-12-09 NOTE — Progress Notes (Signed)
Post Partum Day 1 Subjective: no complaints, up ad lib, voiding, tolerating PO and + flatus  Objective: Blood pressure 132/64, pulse 71, temperature 98.1 F (36.7 C), temperature source Oral, resp. rate 18, height 5\' 5"  (1.651 m), weight 98.4 kg, last menstrual period 03/16/2018, SpO2 99 %, unknown if currently breastfeeding.  Physical Exam:  General: alert, cooperative and no distress Lochia: appropriate Uterine Fundus: firm Incision: NA DVT Evaluation: No evidence of DVT seen on physical exam.  Recent Labs    12/08/18 0859 12/09/18 0501  HGB 11.3* 9.6*  HCT 32.6* 28.4*    Assessment/Plan: Plan for discharge tomorrow   LOS: 2 days   .Marcille Buffy DNP, CNM  12/09/18  11:01 AM

## 2018-12-10 ENCOUNTER — Other Ambulatory Visit: Payer: Self-pay | Admitting: Obstetrics & Gynecology

## 2018-12-10 MED ORDER — POLYETHYLENE GLYCOL 3350 17 GM/SCOOP PO POWD: 1.0000 | Freq: Once | ORAL | 0 refills | Status: AC

## 2018-12-10 MED ORDER — ACETAMINOPHEN 325 MG PO TABS: 650.0000 mg | ORAL_TABLET | ORAL | Status: DC | PRN

## 2018-12-10 MED ORDER — IBUPROFEN 600 MG PO TABS: 600.0000 mg | ORAL_TABLET | Freq: Four times a day (QID) | ORAL | 0 refills | Status: DC

## 2018-12-10 MED ORDER — MEASLES, MUMPS & RUBELLA VAC IJ SOLR: 0.5000 mL | Freq: Once | INTRAMUSCULAR | Status: DC

## 2018-12-10 NOTE — Lactation Note (Signed)
This note was copied from a baby's chart. Lactation Consultation Note  Patient Name: Cheyenne Bauer [id number] Date: 12/10/2018 Reason for consult: Early term 37-38.6wks;1st time breastfeeding;Nipple pain/trauma;Infant weight loss;Other (Comment)(7 % weight loss / @ 35 hours - 7.4)  Baby is 74 hours old  LC reviewed and updated the doc flow sheets per dad and the yellow sheet.  Baby recently fed and is asleep. Per mom, dad, and grandmother baby cluster fed all night with a few breaks.  Mom mentioned the [place] on nights gave her several tips for her sore nipples.  Sore nipple and engorgement prevention and tx reviewed.  Mom has the comfort gels, breast shells, coconut oil and is aware not to mix the comfort gels and coconut oil.  Discussed the importance if the breast is really full to start need to express off the fullness so the baby achieved the depth with latch. If the baby only feeds the 1st breast and doesn't get to the 2nd breast release down to comfort. Storage of breast milk ( page 60 Mother and baby care booklet) .  [place] reviewed phone numbers for [facility] resources after D/C . And encouraged mom to call for feeding assessment if desired on the nurses light.     Maternal Data    Feeding Feeding Type: (per dad baby recently fed at 72 - asleep awaiting to be circ'd)  LATCH Score                   Interventions Interventions: Breast feeding basics reviewed;Shells;Comfort gels;DEBP  Lactation Tools Discussed/Used Tools: Pump;Shells;Coconut oil;Comfort gels Shell Type: Inverted Breast pump type: Double-Electric Breast Pump Pump Review: Milk Storage   Consult Status Consult Status: Complete(mom aware to call for latch assessment) Date: 12/10/18    Cheyenne Bauer 12/10/2018, 9:33 AM

## 2018-12-10 NOTE — Discharge Instructions (Signed)

## 2018-12-14 ENCOUNTER — Other Ambulatory Visit: Payer: Self-pay

## 2018-12-14 ENCOUNTER — Ambulatory Visit (INDEPENDENT_AMBULATORY_CARE_PROVIDER_SITE_OTHER): Payer: BC Managed Care – PPO | Admitting: Women's Health

## 2018-12-14 ENCOUNTER — Encounter: Payer: Self-pay | Admitting: Women's Health

## 2018-12-14 VITALS — BP 138/82 | HR 88 | Ht 65.0 in | Wt 195.6 lb

## 2018-12-14 DIAGNOSIS — K649 Unspecified hemorrhoids: Secondary | ICD-10-CM

## 2018-12-14 DIAGNOSIS — Z331 Pregnant state, incidental: Secondary | ICD-10-CM

## 2018-12-14 DIAGNOSIS — Z1389 Encounter for screening for other disorder: Secondary | ICD-10-CM

## 2018-12-14 DIAGNOSIS — T148XXA Other injury of unspecified body region, initial encounter: Secondary | ICD-10-CM

## 2018-12-14 DIAGNOSIS — Z0130 Encounter for examination of blood pressure without abnormal findings: Secondary | ICD-10-CM

## 2018-12-14 MED ORDER — HYDROCORTISONE ACETATE 25 MG RE SUPP: 25.0000 mg | Freq: Two times a day (BID) | RECTAL | 6 refills | Status: DC

## 2018-12-14 NOTE — Progress Notes (Signed)
   GYN VISIT Patient name: Cheyenne Bauer MRN [id number]  Date of birth: [birth year] Chief Complaint:   Blood Pressure Check (hematoma check/b/p receck 138/82 pulse 81)  History of Present Illness:   Cheyenne Bauer is a 28 y.o. [id number] Caucasian female 6d s/p SVB after IOL for GHTN, being seen today for bp and hematoma check. Had 2nd degree perineal w/ Rt labial hematoma. BPs were normal on d/c, was not d/c'd on bp meds.  Breastfeeding.  Hemorrhoids bothering her, doing sitz baths, otc preparation H which helps. Denies ha, visual changes, ruq/epigastric pain, n/v.  BPs at home normal.   Patient's last menstrual period was 03/16/2018 (exact date). Review of Systems:   Pertinent items are noted in HPI Denies fever/chills, dizziness, headaches, visual disturbances, fatigue, shortness of breath, chest pain, abdominal pain, vomiting, abnormal vaginal discharge/itching/odor/irritation, problems with periods, bowel movements, urination, or intercourse unless otherwise stated above.  Pertinent History Reviewed:  Reviewed past medical,surgical, social, obstetrical and family history.  Reviewed problem list, medications and allergies. Physical Assessment:   Vitals:   12/14/18 1203 12/14/18 1239  BP: (!) 143/86 138/82  Pulse: 88   Weight: 195 lb 9.6 oz (88.7 kg)   Height: 5\' 5"  (1.651 m)   Body mass index is 32.55 kg/m.       Physical Examination:   General appearance: alert, well appearing, and in no distress  Mental status: alert, oriented to person, place, and time  Skin: warm & dry   Cardiovascular: normal heart rate noted  Respiratory: normal respiratory effort, no distress  Abdomen: soft, non-tender   Pelvic: small Rt labial hematoma normal, 2nd degree perineal lac well approximated, stitches in place. 2 small non-thrombosed hemorrhoids.   Extremities: no edema   No results found for this or any previous visit (from the past 24 hour(s)).  Assessment & Plan:  1) 6d s/p SVB after IOL for  GHTN> breastfeeding  2) BP check> initially elevated, normal on repeat, home readings normal per pt, asymptomatic. To keep checking at home, if becomes elevated let us know  3) Labial hematoma> stable  4) Hemorrhoids> rx anusol  Meds:  Meds ordered this encounter  Medications  . hydrocortisone (ANUSOL-HC) 25 MG suppository    Sig: Place 1 suppository (25 mg total) rectally 2 (two) times daily.    Dispense:  12 suppository    Refill:  6    Order Specific Question:   Supervising Provider    Answer:   Elonda Husky, LUTHER H [[id number]]    No orders of the defined types were placed in this encounter.   Return for As scheduled.  [place], [facility] 12/14/2018 12:41 PM

## 2019-01-02 ENCOUNTER — Ambulatory Visit: Payer: Self-pay

## 2019-01-02 NOTE — Lactation Note (Signed)
This note was copied from a baby's chart. Lactation Consultation Note  Patient Name: Cheyenne Bauer EXBMW'UToday's Date: 01/02/2019   01/02/2019  Name: Cheyenne Bauer MRN: [id number] Date of Birth: 12/08/2016 Gestational Age: Gestational Age: 413w1d Birth Weight: 118 oz Weight today:  Weight: 6 lb 11.2 oz (31304320 g)  953 week old ET infant presents today with mom and Maternal aunt for feeding assessment. Mom is concerned infant is not gaining well and is concerned with her milk supply.   Infant is 64 grams below discharge weight and 241 grams below birthweight. Mom reports the home health nurse weighed him yesterday and he was 6 pounds 9 ounces. Infant was 6 pounds 5 ounces on 10/13.   Infant self awakens during the day to feed about every 3 hours. He sleeps 5 hours at night per mom. Mom is feeding infant skin to skin. Mom reports good swallows with  feedings for about 10 minutes, mom reports breast softening with feeding. Mom reports she stimulates infant and massages breast with feeding.    Mom is pumping after each feeding and sometimes pumps second breast while infant feeds on the first breast. She is pumping in the morning and is getting about an ounce per pumping in the morning. Infant woke up a lot more last night and fed more per mom. Mom is giving infant EBM and today infant got 1.5 ounces of formula as mom felt like infant was not getting enough. Mom using # 27 flanges as she reports the # 24 flanges made her sore. Gave her # 21 flanges to use with her Medela parts to see if that will help.   Infant with thick labial frenulum that inserts at the bottom of the gum ridge. Upper lip flanges well on the breast. Infant with good tongue extension and lateralization. Infant with some decreased mid tongue elevation. Infant with strong suckle on gloved finger. Infant with hump to back of tongue with suckling. Infant tires easily on the breast, may be due to be deficient in calories or may  be due to tongue restrictions. Infant with intermittent clicking on the breast and the bottle per mom. Infant drools some on the bottle but not much. Infant chokes on the Dr. Theora GianottiBrown's Level 1 nipple. Discussed trying Dr. Theora GianottiBrown's Preemie nipple. Will reassess at next feeding assessment after increasing infant calories.   Reviewed with mom that infant is deficient in calories and that calories need to be increased by using supplement and pumping until infant is feeding better at the breast. Advised mom that infant needs supplement until he is growing better. Enc mom to limit BF to 30 minutes until he is feeding more efficiently at the breast.   Inf[facility] [place] [facility] [place]10. Infant to follow up with Lactation in 2 weeks.      General Information: Mother's reason for visit: Feeding assessment, inadequate weight gain Consult: Initial Lactation consultant: Noralee StainSharon Ryker [place] RN,IBCLC Breastfeeding experience: latching every 3-5 hours, started supplement today Maternal medical conditions: Pregnancy induced hypertension, Other(Hx Depression) Maternal medications: Pre-natal vitamin  Breastfeeding History: Frequency of breast feeding: every 3-5 hours, self awakens Duration of feeding: 15-1+ hours  Supplementation: Supplement method: bottle(Dr. Brown's chokes and throws up even with pace feeding. Infant does best on the Enfamil Blue ringed nipple. Mom reports infant does not choke on this nipple and does not spit up with it.) Brand: Enfamil Formula volume: 1.5 ounces Formula frequency: 1 x today Total  formula volume per day: asked mom to increase supplement today Breast milk volume: .5-1 ounces Breast milk frequency: 2-3 x a day   Pump type: Other(Lansinoh) Pump frequency: 4-7 x a day Pump volume: few gtts-1 ounce  Infant Output Assessment: Voids per 24 hours: 7+ Urine color: Clear yellow Stools per 24 hours: 1-usually large Stool color: Yellow  Breast  Assessment: Breast: Soft, Compressible Nipple: Erect Pain level: 0 Pain interventions: Bra, Breast pump, Lanolin  Feeding Assessment: Infant oral assessment: Variance Infant oral assessment comment: see note Positioning: Football(left breast, 20 minutes) Latch: 1 - Repeated attempts needed to sustain latch, nipple held in mouth throughout feeding, stimulation needed to elicit sucking reflex. Audible swallowing: 2 - Spontaneous and intermittent Type of nipple: 2 - Everted at rest and after stimulation(asymmetrical post feeding) Comfort: 2 - Soft/non-tender Hold: 2 - No assistance needed to correctly position infant at breast LATCH score: 9 Latch assessment: Deep Lips flanged: Yes Suck assessment: Displays both   Pre-feed weight: 3016 grams Post feed weight: 3044 grams Amount transferred: 28 ml Amount supplemented: 0  Additional Feeding Assessment: Infant oral assessment: Variance Infant oral assessment comment: see note Positioning: Football(right breast,) Latch: 1 - Repeated attempts neede to sustain latch, nipple held in mouth throughout feeding, stimulation needed to elicit sucking reflex. Audible swallowing: 2 - Spontaneous and intermittent Type of nipple: 2 - Everted at rest and after stimulation Comfort: 2 - Soft/non-tender Hold: 2 - No assistance needed to correctly position infant at breast LATCH score: 9 Latch assessment: Deep Lips flanged: Yes Suck assessment: Displays both   Pre-feed weight: 3044 grams  Post feed weight 3052 grams Total Transferred 8 ml Total Supplement: 30 ml formula via bottle         Totals:        Total amount transferred: 36 ml Total amount supplemented: 30 ml formula via bottle  Plan: 1. Offer infant the breast with feeding cues. Infant needs at least 8 feedings in 24 hours.  Limit breast feeding to 30 minutes and supplement infant after feedings at the breast with about an ounce per feeding.  2. Keep infant awake at the  breast as needed with feedings 3. Feed infant skin to skin 4. Offer infant both breasts with each feeding 5. Empty one breast before offering second breast 6. Offer infant bottle of pumped breast milk or formula after breast feeding of about an ounce per feeding, offer more if infant wants it.  7. Feed infant using the paced bottle feeding method (video on [email or web address].com) 8. Infant needs about 56-75 ml ( 2-2.5 ounces) for 8 feedings a day or 450-600 ml (15-20 ounces) in 24 hours. Infant may take more or less depending on how often he feeds. Feed infant until he is satisfied.  9. Would recommend that you pump 4-6 x a day after breast feeding with your double electric breast pump for 15-20 minutes to promote and protect milk supply. Using a hands free bra may be helpful so you can massage with pumping. Try the # 21 flanges for pumping. Pump anytime infant gets a bottle of supplement.  10. Keep up the good work 11. Thank you for allowing me to assist you today 12. Please call with any questions or concerns [phone] 13. Follow up with Lactation in 2 weeks.   Ed Blalock RN, IBCLC  Debby Freiberg Tagg Eustice 01/02/2019, 2:19 PM

## 2019-01-14 ENCOUNTER — Ambulatory Visit: Payer: Self-pay

## 2019-01-14 ENCOUNTER — Telehealth: Payer: BC Managed Care – PPO | Admitting: Advanced Practice Midwife

## 2019-01-14 ENCOUNTER — Encounter: Payer: Self-pay | Admitting: Advanced Practice Midwife

## 2019-01-14 ENCOUNTER — Telehealth (INDEPENDENT_AMBULATORY_CARE_PROVIDER_SITE_OTHER): Payer: BC Managed Care – PPO | Admitting: Advanced Practice Midwife

## 2019-01-14 NOTE — Lactation Note (Signed)
This note was copied from a baby's chart. Lactation Consultation Note  Patient Name: Cheyenne Bauer JKKXF'G Date: 01/14/2019     01/14/2019  Name: Cheyenne Bauer MRN: [id number] Date of Birth: 12/08/2016 Gestational Age: Gestational Age: [redacted]w[redacted]d Birth Weight: 118 oz Weight today:  Weight: 7 lb 6.9 oz (3373 g)  29 week old infant presents today with mom and aunt for follow up feeding assessment.  Infant has gained 330 grams in the last 12 days with an average daily weight gain of 28 grams a day.  Infant is self awakening for feedings during the day every 2.5-3 hours during the day and taking one 4 hours stretch at night. Infant is more active with bottle then breast feeding.   Mom has been pumping and bottle feeding. Infant was nursing and infant was spitting up.   Mom was using the Dr. Theora Gianotti Nipple and infant was feeding too fast. She is now using the Enfamil Preemie Nipple.   Mom is pumping about every 3 hours with Symphony pump and getting 1-3 ounces per pumping. She is supplementing with formula with each feeding. She is giving him 1.5 ounces of EBM and 1.5 ounces of formula with each feeding.   Mom is using the # 21 flanges and the Symphony pump and reports that is helping.   Infant with thick labial frenulum that inserts at the bottom of the gum ridge. Upper lip flanges well on the breast. Infant with good tongue extension and lateralization. Infant with some decreased mid tongue elevation. Posterior lingual frenulum noted.  Infant with strong suckle on gloved finger. Infant with hump to back of tongue with suckling. Infant tires easily on the breast. Infant with intermittent clicking on the breast and the bottle per mom. Infant drools on the bottle.  Infant chokes on the Dr. Theora Gianotti Level 1 nipple. Mom using the Enfamil Preemie nipple. Infant is sleepy at the breast today. He fell asleep easily. Mom with no pain with feeding. Infant tires easily with feeding. Mom  stimulates infant as needed. Website information and local provider information given. Mom to decide if infant to be seen.   Mom is trying to decide if she wants to continue BF or not. She reports she is not liking to pump. She would like to just BF but infant sleepy at the breast. Reviewed how to dry up milk at mom's request.   Mom is eating well, she is eating Lactation Cookies. She was taking Fenugreek and felt infant had an allergic reaction and stopped taking it.   Infant latched to both breasts and fed off and on for about 20 minutes. Infant transferred 36 ml and tolerated it well. Mom finished feeding with bottle of EBM.   Reviewed offering supplement after BF when infant still cueing to feed. Feed infant until he is satisfied.   Reviewed SNS with mom at her request. Mom to research and decide if she wants to pursue.   Infant to follow up with Dr. Garner Nash at 37 months of age (12/17) . Infant to follow up with Lactation as needed at [facility] request.     General Information: Mother's reason for visit: Follow up feeding assessment Consult: Follow-up Lactation consultant: Noralee Stain RN,IBCLC Breastfeeding experience: pumping and bottle feeding for the last week Maternal medical conditions: History post partum depression, Pregnancy induced hypertension Maternal medications: Pre-natal vitamin  Breastfeeding History: Frequency of breast feeding: not latching in the last week    Supplementation: Supplement method: bottle(Dr. Brown's level 1  nipple, Enfamil Preemie Nipple (purple ring)) Brand: Enfamil Formula volume: 1.5 hours Formula frequency: every 3 hours average   Breast milk volume: 1.5 ounces Breast milk frequency: every 3 hours average   Pump type: Symphony Pump frequency: every 3 hours Pump volume: 1-2 ounces  Infant Output Assessment: Voids per 24 hours: 8+ Urine color: Clear yellow Stools per 24 hours: 1-large Stool color: Yellow  Breast Assessment: Breast:  Soft, Compressible Nipple: Erect Pain level: 0 Pain interventions: Bra, Breast pump, Coconut oil  Feeding Assessment: Infant oral assessment: Variance Infant oral assessment comment: see note Positioning: Football(right breast, 10 minutes) Latch: 1 - Repeated attempts needed to sustain latch, nipple held in mouth throughout feeding, stimulation needed to elicit sucking reflex. Audible swallowing: 2 - Spontaneous and intermittent Type of nipple: 2 - Everted at rest and after stimulation Comfort: 2 - Soft/non-tender Hold: 2 - No assistance needed to correctly position infant at breast LATCH score: 9 Latch assessment: Deep Lips flanged: Yes Suck assessment: Displays both   Pre-feed weight: 3370 grams Post feed weight: 3386 grams Amount transferred: 16 ml Amount supplemented: 0  Additional Feeding Assessment: Infant oral assessment: Variance Infant oral assessment comment: see note Positioning: Football(left breast, 15 minutes) Latch: 1 - Repeated attempts neede to sustain latch, nipple held in mouth throughout feeding, stimulation needed to elicit sucking reflex. Audible swallowing: 2 - Spontaneous and intermittent Type of nipple: 2 - Everted at rest and after stimulation Comfort: 2 - Soft/non-tender Hold: 2 - No assistance needed to correctly position infant at breast LATCH score: 9 Latch assessment: Deep Lips flanged: Yes Suck assessment: Displays both Tools: Pump, Bottle Pre-feed weight: 3386 grams/3378 grams after diaper change Post feed weight: 3404 grams/3378 grams Amount transferred: 20 ml/0 ml Amount supplemented: 45 ml EBM via bottle  Totals: Total amount transferred: 36 ml Total supplement given: 45 ml EBM via bottle Total amount pumped post feed: did not pump   Plan:  1. Offer infant the breast with feeding cues. Infant needs at least 7 feedings in 24 hours.  Limit breast feeding to 30 minutes and supplement infant after feedings at the breast with about an  ounce per feeding.  2. Keep infant awake at the breast as needed with feedings 3. Feed infant skin to skin 4. Offer infant both breasts with each feeding 5. Empty one breast before offering second breast 6. Offer infant bottle of pumped breast milk or formula after breast feeding if still cueing to feed 7. Feed infant using the paced bottle feeding method (video on [email or web address].com) 8. Infant needs about  62-83 ml ( 2-3 ounces) for 8 feedings a day or 495-660 ml (17-22 ounces) in 24 hours. Infant may take more or less depending on how often he feeds. Feed infant until he is satisfied.  9. Would recommend that you pump 4-6 x a day after breast feeding with your double electric breast pump for 15-20 minutes to promote and protect milk supply. Using a hands free bra may be helpful so you can massage with pumping. Try the # 21 flanges for pumping. Pump anytime infant gets a bottle of supplement. 10. Reglan can be helpful with milk production. The typical dosage is 10 mg three times a day 11. Keep up the good work 12. Thank you for allowing me to assist you today 13. Please call with any questions or concerns [phone] 14. Follow up with Lactation as needed   Ed BlalockSharon S Vernis [place] RN, [employer]CLC  Debby Freiberg Tawonna Esquer 01/14/2019, 3:31 PM

## 2019-01-14 NOTE — Progress Notes (Signed)
TELEHEALTH VIRTUAL POSTPARTUM VISIT ENCOUNTER NOTE  I connected with@ on 01/14/19 at 11:10 AM EST by telephone at home and verified that I am speaking with the correct person using two identifiers.   I discussed the limitations, risks, security and privacy concerns of performing an evaluation and management service by telephone and the availability of in person appointments. I also discussed with the patient that there may be a patient responsible charge related to this service. The patient expressed understanding and agreed to proceed.  Appointment Date: 01/14/2019  OBGYN Clinic: [facility]  Chief Complaint:  Postpartum Visit  History of Present Illness: Cheyenne Bauer is a 28 y.o. Caucasian [id number] (No LMP recorded. (Menstrual status: Lactating).), seen for the above chief complaint. Her past medical history is significant for endometriosis, asthma   She is s/p normal spontaneous vaginal delivery on 12/08/18 at 38.1 weeks; she was discharged to home on PPD#2. Pregnancy complicated by GHTN, resolved. Cheyenne Bauer is doing well.  Had some trouble gaining weight, so pt has been pumping breast milk and supplementing.  Not producing much milk now  Has appt w/lactation consultant today.  Advised to ask about SNS (baby latches very well) .Marland Kitchen  Complains of nothing  Vaginal bleeding or discharge: lochia alba Mode of feeding infant: Bottle and Breast Intercourse: No  Contraception: condoms PP depression s/s: No .  Any bowel or bladder issues: No  Pap smear: no abnormalities (date: 2019)  Review of Systems: Positive for nothing. Her 12 point review of systems is negative or as noted in the History of Present Illness.  Patient Active Problem List   Diagnosis Date Noted  . Gestational hypertension 12/07/2018  . Rubella non-immune status, antepartum 05/15/2018  . Hyperemesis 05/14/2018  . Depression 04/07/2017    Medications Cheyenne Bauer" had no medications administered during this  visit. Current Outpatient Medications  Medication Sig Dispense Refill  . hydrocortisone (ANUSOL-HC) 25 MG suppository Place 1 suppository (25 mg total) rectally 2 (two) times daily. 12 suppository 6  . Prenatal Multivit-Min-Fe-FA (PRENATAL VITAMINS) 0.8 MG tablet Take 1 tablet by mouth daily. 30 tablet 12   No current facility-administered medications for this visit.     Allergies Latex  Physical Exam:  General:  Alert, oriented and cooperative.   Mental Status: Normal mood and affect perceived. Normal judgment and thought content.  Rest of physical exam deferred due to type of encounter  PP Depression Screening:   Edinburgh Postnatal Depression Scale - 01/14/19 1124      Edinburgh Postnatal Depression Scale:  In the Past 7 Days   I have been able to laugh and see the funny side of things.  0    I have looked forward with enjoyment to things.  0    I have blamed myself unnecessarily when things went wrong.  1    I have been anxious or worried for no good reason.  0    I have felt scared or panicky for no good reason.  0    Things have been getting on top of me.  1    I have been so unhappy that I have had difficulty sleeping.  0    I have felt sad or miserable.  0    I have been so unhappy that I have been crying.  0    The thought of harming myself has occurred to me.  0    Edinburgh Postnatal Depression Scale Total  2  Assessment:Patient is a 28 y.o. [id number] who is 4+ weeks postpartum from a normal spontaneous vaginal delivery.  She is doing well.   Plan: Pap 2022   RTC as needed  I discussed the assessment and treatment plan with the patient. The patient was provided an opportunity to ask questions and all were answered. The patient agreed with the plan and demonstrated an understanding of the instructions.   The patient was advised to call back or seek an in-person evaluation/go to the ED for any concerning postpartum symptoms.  I provided 10 minutes of  non-face-to-face time during this encounter.   Jacklyn Shell, CNM [facility] [employer], [facility]

## 2019-02-19 ENCOUNTER — Other Ambulatory Visit: Payer: Self-pay | Admitting: Women's Health

## 2019-02-19 MED ORDER — METRONIDAZOLE 500 MG PO TABS: 500.0000 mg | ORAL_TABLET | Freq: Two times a day (BID) | ORAL | 0 refills | Status: DC

## 2019-07-11 DIAGNOSIS — M62830 Muscle spasm of back: Secondary | ICD-10-CM | POA: Diagnosis not present

## 2019-07-11 DIAGNOSIS — M545 Low back pain: Secondary | ICD-10-CM | POA: Diagnosis not present

## 2019-08-21 DIAGNOSIS — J309 Allergic rhinitis, unspecified: Secondary | ICD-10-CM | POA: Diagnosis not present

## 2019-10-17 DIAGNOSIS — Z20828 Contact with and (suspected) exposure to other viral communicable diseases: Secondary | ICD-10-CM | POA: Diagnosis not present

## 2019-11-23 DIAGNOSIS — R21 Rash and other nonspecific skin eruption: Secondary | ICD-10-CM | POA: Diagnosis not present

## 2019-11-23 DIAGNOSIS — Z6828 Body mass index (BMI) 28.0-28.9, adult: Secondary | ICD-10-CM | POA: Diagnosis not present

## 2019-11-23 DIAGNOSIS — L404 Guttate psoriasis: Secondary | ICD-10-CM | POA: Diagnosis not present

## 2019-11-28 DIAGNOSIS — R21 Rash and other nonspecific skin eruption: Secondary | ICD-10-CM | POA: Diagnosis not present

## 2019-11-28 DIAGNOSIS — Z6828 Body mass index (BMI) 28.0-28.9, adult: Secondary | ICD-10-CM | POA: Diagnosis not present

## 2019-12-17 DIAGNOSIS — L271 Localized skin eruption due to drugs and medicaments taken internally: Secondary | ICD-10-CM | POA: Diagnosis not present

## 2019-12-17 DIAGNOSIS — L308 Other specified dermatitis: Secondary | ICD-10-CM | POA: Diagnosis not present

## 2019-12-20 DIAGNOSIS — T50Z95A Adverse effect of other vaccines and biological substances, initial encounter: Secondary | ICD-10-CM | POA: Diagnosis not present

## 2020-05-12 ENCOUNTER — Other Ambulatory Visit: Payer: BC Managed Care – PPO | Admitting: Adult Health

## 2020-05-15 ENCOUNTER — Other Ambulatory Visit: Payer: BC Managed Care – PPO | Admitting: Adult Health

## 2020-06-18 DIAGNOSIS — Z28311 Partially vaccinated for covid-19: Secondary | ICD-10-CM | POA: Diagnosis not present

## 2020-07-02 DIAGNOSIS — L410 Pityriasis lichenoides et varioliformis acuta: Secondary | ICD-10-CM | POA: Diagnosis not present

## 2020-07-02 DIAGNOSIS — L270 Generalized skin eruption due to drugs and medicaments taken internally: Secondary | ICD-10-CM | POA: Diagnosis not present

## 2020-07-06 ENCOUNTER — Other Ambulatory Visit: Payer: BC Managed Care – PPO | Admitting: Adult Health

## 2020-07-20 ENCOUNTER — Other Ambulatory Visit (HOSPITAL_COMMUNITY)
Admission: RE | Admit: 2020-07-20 | Discharge: 2020-07-20 | Disposition: A | Discharge status: Home / Self Care | Payer: BC Managed Care – PPO | Source: Ambulatory Visit | Attending: Adult Health | Admitting: Adult Health

## 2020-07-20 ENCOUNTER — Encounter: Payer: Self-pay | Admitting: Adult Health

## 2020-07-20 ENCOUNTER — Other Ambulatory Visit: Payer: Self-pay

## 2020-07-20 ENCOUNTER — Ambulatory Visit (INDEPENDENT_AMBULATORY_CARE_PROVIDER_SITE_OTHER): Payer: BC Managed Care – PPO | Admitting: Adult Health

## 2020-07-20 VITALS — BP 112/76 | HR 86 | Ht 64.25 in | Wt 158.5 lb

## 2020-07-20 DIAGNOSIS — Z01419 Encounter for gynecological examination (general) (routine) without abnormal findings: Secondary | ICD-10-CM | POA: Insufficient documentation

## 2020-07-20 DIAGNOSIS — L410 Pityriasis lichenoides et varioliformis acuta: Secondary | ICD-10-CM | POA: Insufficient documentation

## 2020-07-20 DIAGNOSIS — Z319 Encounter for procreative management, unspecified: Secondary | ICD-10-CM | POA: Diagnosis not present

## 2020-07-20 NOTE — Progress Notes (Signed)
Patient ID: Cheyenne Bauer, female   DOB: 1991-02-08, 30 y.o.   MRN: [id number] History of Present Illness:  Cheyenne Bauer is a 30 year old white female,married, G3P1021 in for a well woman gyn exam and pap. PCP is Dr Reuel Boom.  Current Medications, Allergies, Past Medical History, Past Surgical History, Family History and Social History were reviewed in [name] record.     Review of Systems: Patient denies any headaches, hearing loss, fatigue, blurred vision, shortness of breath, chest pain, abdominal pain, problems with bowel movements, urination, or intercourse. No joint pain or mood swings. Had had rash from COVID vaccine,she saw dermatologist and is on doxycycline    Physical Exam:BP 112/76 (BP Location: Left Arm, Patient Position: Sitting, Cuff Size: Normal)   Pulse 86   Ht 5' 4.25" (1.632 m)   Wt 158 lb 8 oz (71.9 kg)   LMP 07/06/2020   Breastfeeding No   BMI 27.00 kg/m  General:  Well developed, well nourished, no acute distress Skin:  Warm and dry Neck:  Midline trachea, normal thyroid, good ROM, no lymphadenopathy Lungs; Clear to auscultation bilaterally Breast:  No dominant palpable mass, retraction, or nipple discharge Cardiovascular: Regular rate and rhythm Abdomen:  Soft, non tender, no hepatosplenomegaly Pelvic:  External genitalia is normal in appearance, no lesions.  The vagina is normal in appearance. Urethra has no lesions or masses. The cervix is bulbous,+ovulation mucous, pap with HR HPV genotyping performed.  Uterus is felt to be normal size, shape, and contour.  No adnexal masses or tenderness noted.Bladder is non tender, no masses felt. Rectal: Deferred Extremities/musculoskeletal:  No swelling or varicosities noted, no clubbing or cyanosis Psych:  No mood changes, alert and cooperative,seems happy AA is 2 Fall risk is low Depression screen [facility] 2/9 07/20/2020 05/14/2018 04/18/2018  Decreased Interest 1 2 3   Down, Depressed, Hopeless 1 2 3    PHQ - 2 Score 2 4 6   Altered sleeping 1 3 3   Tired, decreased energy 1 3 3   Change in appetite 1 3 3   Feeling bad or failure about yourself  1 3 3   Trouble concentrating 1 3 3   Moving slowly or fidgety/restless 1 0 0  Suicidal thoughts 1 0 0  PHQ-9 Score 9 19 21   Difficult doing work/chores - - -   She is Wellbutrin and goes to therapy, denies any SI or HI  GAD 7 : Generalized Anxiety Score 07/20/2020  Nervous, Anxious, on Edge 1  Control/stop worrying 1  Worry too much - different things 1  Trouble relaxing 1  Restless 1  Easily annoyed or irritable 1  Afraid - awful might happen 1  Total GAD 7 Score 7    Upstream - 07/20/20 1139      Pregnancy Intention Screening   Does the patient want to become pregnant in the next year? Yes    Does the patient's partner want to become pregnant in the next year? Yes    Would the patient like to discuss contraceptive options today? No      Contraception Wrap Up   Current Method Pregnant/Seeking Pregnancy    End Method Pregnant/Seeking Pregnancy    Contraception Counseling Provided No         Examination chaperoned by LPN   Impression and Plan; 1. Encounter for gynecological examination with Papanicolaou smear of cervix Pap sent Physical in 1 year Pap in 3 if normal   2. Patient desires pregnancy Finish doxycycline before getting pregnant Take OTC  PNV

## 2020-07-21 LAB — CYTOLOGY - PAP
Comment: NEGATIVE
Diagnosis: NEGATIVE
High risk HPV: NEGATIVE

## 2020-11-09 ENCOUNTER — Other Ambulatory Visit: Payer: Self-pay

## 2020-11-09 ENCOUNTER — Other Ambulatory Visit (INDEPENDENT_AMBULATORY_CARE_PROVIDER_SITE_OTHER): Payer: BC Managed Care – PPO

## 2020-11-09 VITALS — BP 105/65 | HR 69 | Ht 65.5 in | Wt 151.0 lb

## 2020-11-09 DIAGNOSIS — Z3201 Encounter for pregnancy test, result positive: Secondary | ICD-10-CM | POA: Diagnosis not present

## 2020-11-09 DIAGNOSIS — Z3200 Encounter for pregnancy test, result unknown: Secondary | ICD-10-CM

## 2020-11-09 LAB — POCT URINE PREGNANCY: Preg Test, Ur: POSITIVE — AB

## 2020-11-09 MED ORDER — BONJESTA 20-20 MG PO TBCR: 1.0000 | EXTENDED_RELEASE_TABLET | Freq: Every day | ORAL | 8 refills | Status: DC

## 2020-11-09 NOTE — Addendum Note (Signed)
Addended by: Cheral Marker on: 11/09/2020 04:57 PM   Modules accepted: Orders

## 2020-11-09 NOTE — Progress Notes (Addendum)
   NURSE VISIT- PREGNANCY CONFIRMATION   SUBJECTIVE:  Cheyenne Bauer is a 30 y.o. [phone] female at [redacted]w[redacted]d by certain LMP of Patient's last menstrual period was 10/03/2020 (exact date). Here for pregnancy confirmation.  Home pregnancy test: positive x 2   She reports nausea and vomiting, cramping.  She is taking prenatal vitamins.    OBJECTIVE:  BP 105/65 (BP Location: Left Arm, Patient Position: Sitting, Cuff Size: Normal)   Pulse 69   Ht 5' 5.5" (1.664 m)   Wt 151 lb (68.5 kg)   LMP 10/03/2020 (Exact Date)   Breastfeeding No   BMI 24.75 kg/m   Appears well, in no apparent distress  Results for orders placed or performed in visit on 11/09/20 (from the past 24 hour(s))  POCT urine pregnancy   Collection Time: 11/09/20  3:09 PM  Result Value Ref Range   Preg Test, Ur Positive (A) Negative    ASSESSMENT: Positive pregnancy test, [redacted]w[redacted]d by LMP    PLAN: Schedule for dating ultrasound in 3 weeks Prenatal vitamins: continue   Nausea medicines: requested-note routed to Joellyn Haff to send prescription for [place] only, nothing else works [employer] packet given: Yes  Andrianna Manalang A Anayia Eugene  11/09/2020 3:18 PM   Chart reviewed for nurse visit. Agree with plan of care. Rx [place] [facility] [place] [place] Cheral Marker, [place] 11/09/2020 4:56 PM

## 2020-11-10 ENCOUNTER — Other Ambulatory Visit: Payer: Self-pay

## 2020-11-10 MED ORDER — SCOPOLAMINE 1 MG/3DAYS TD PT72: 1.0000 | MEDICATED_PATCH | TRANSDERMAL | 12 refills | Status: DC

## 2020-11-30 ENCOUNTER — Other Ambulatory Visit: Payer: Self-pay

## 2020-11-30 ENCOUNTER — Other Ambulatory Visit: Payer: Self-pay | Admitting: Obstetrics & Gynecology

## 2020-11-30 ENCOUNTER — Ambulatory Visit (INDEPENDENT_AMBULATORY_CARE_PROVIDER_SITE_OTHER): Payer: BC Managed Care – PPO

## 2020-11-30 DIAGNOSIS — O3680X0 Pregnancy with inconclusive fetal viability, not applicable or unspecified: Secondary | ICD-10-CM

## 2020-11-30 DIAGNOSIS — Z3A08 8 weeks gestation of pregnancy: Secondary | ICD-10-CM | POA: Diagnosis not present

## 2020-11-30 NOTE — Progress Notes (Signed)
[place] 8+2 wks,single IUP with YS,CRL 21.17 mm,normal ovaries,FHR 167 bpm

## 2020-12-28 ENCOUNTER — Other Ambulatory Visit: Payer: Self-pay | Admitting: Obstetrics & Gynecology

## 2020-12-28 DIAGNOSIS — Z3682 Encounter for antenatal screening for nuchal translucency: Secondary | ICD-10-CM

## 2020-12-29 ENCOUNTER — Encounter: Payer: BC Managed Care – PPO | Admitting: Women's Health

## 2020-12-29 ENCOUNTER — Ambulatory Visit (INDEPENDENT_AMBULATORY_CARE_PROVIDER_SITE_OTHER): Payer: BC Managed Care – PPO

## 2020-12-29 ENCOUNTER — Other Ambulatory Visit: Payer: BC Managed Care – PPO

## 2020-12-29 ENCOUNTER — Ambulatory Visit: Payer: BC Managed Care – PPO | Admitting: *Deleted

## 2020-12-29 ENCOUNTER — Other Ambulatory Visit: Payer: Self-pay

## 2020-12-29 ENCOUNTER — Ambulatory Visit (INDEPENDENT_AMBULATORY_CARE_PROVIDER_SITE_OTHER): Payer: BC Managed Care – PPO | Admitting: Women's Health

## 2020-12-29 ENCOUNTER — Encounter: Payer: Self-pay | Admitting: Women's Health

## 2020-12-29 VITALS — BP 123/79 | HR 83 | Wt 158.0 lb

## 2020-12-29 DIAGNOSIS — Z3481 Encounter for supervision of other normal pregnancy, first trimester: Secondary | ICD-10-CM

## 2020-12-29 DIAGNOSIS — Z8759 Personal history of other complications of pregnancy, childbirth and the puerperium: Secondary | ICD-10-CM | POA: Insufficient documentation

## 2020-12-29 DIAGNOSIS — Z3480 Encounter for supervision of other normal pregnancy, unspecified trimester: Secondary | ICD-10-CM

## 2020-12-29 DIAGNOSIS — Z3A12 12 weeks gestation of pregnancy: Secondary | ICD-10-CM

## 2020-12-29 DIAGNOSIS — F603 Borderline personality disorder: Secondary | ICD-10-CM

## 2020-12-29 DIAGNOSIS — Z3490 Encounter for supervision of normal pregnancy, unspecified, unspecified trimester: Secondary | ICD-10-CM | POA: Insufficient documentation

## 2020-12-29 DIAGNOSIS — Z3682 Encounter for antenatal screening for nuchal translucency: Secondary | ICD-10-CM

## 2020-12-29 DIAGNOSIS — F418 Other specified anxiety disorders: Secondary | ICD-10-CM

## 2020-12-29 HISTORY — DX: Personal history of other complications of pregnancy, childbirth and the puerperium: Z87.59

## 2020-12-29 LAB — POCT URINALYSIS DIPSTICK OB
Blood, UA: NEGATIVE
Glucose, UA: NEGATIVE
Ketones, UA: NEGATIVE
Leukocytes, UA: NEGATIVE
Nitrite, UA: NEGATIVE
POC,PROTEIN,UA: NEGATIVE

## 2020-12-29 MED ORDER — ASPIRIN 81 MG PO TBEC: 162.0000 mg | DELAYED_RELEASE_TABLET | Freq: Every day | ORAL | 2 refills | Status: DC

## 2020-12-29 NOTE — Patient Instructions (Signed)
Cheyenne Bauer, thank you for choosing our office today! We appreciate the opportunity to meet your healthcare needs. You may receive a short survey by mail, e-mail, or through [employer]. If you are happy with your care we would appreciate if you could take just a few minutes to complete the survey questions. We read all of your comments and take your feedback very seriously. Thank you again for choosing our office.  Center for [employer] [facility] Team at [facility]  [facility] at [facility] ([address] [place], [place] [address]) Entrance C, located off of [address] Free 24/7 valet parking   Nausea & Vomiting Have saltine crackers or pretzels by your bed and eat a few bites before you raise your head out of bed in the morning Eat small frequent meals throughout the day instead of large meals Drink plenty of fluids throughout the day to stay hydrated, just don't drink a lot of fluids with your meals.  This can make your stomach fill up faster making you feel sick Do not brush your teeth right after you eat Products with real ginger are good for nausea, like ginger ale and ginger hard candy Make sure it says made with real ginger! Sucking on sour candy like lemon heads is also good for nausea If your prenatal vitamins make you nauseated, take them at night so you will sleep through the nausea Sea Bands If you feel like you need medicine for the nausea & vomiting please let us know If you are unable to keep any fluids or food down please let us know   Constipation Drink plenty of fluid, preferably water, throughout the day Eat foods high in fiber such as fruits, vegetables, and grains Exercise, such as walking, is a good way to keep your bowels regular Drink warm fluids, especially warm prune juice, or decaf coffee Eat a 1/2 cup of real oatmeal (not instant), 1/2 cup applesauce, and 1/2-1 cup warm prune juice every day If needed, you may take Colace (docusate sodium) stool softener  once or twice a day to help keep the stool soft.  If you still are having problems with constipation, you may take Miralax once daily as needed to help keep your bowels regular.   Home Blood Pressure Monitoring for Patients   Your provider has recommended that you check your blood pressure (BP) at least once a week at home. If you do not have a blood pressure cuff at home, one will be provided for you. Contact your provider if you have not received your monitor within 1 week.   Helpful Tips for Accurate Home Blood Pressure Checks  Don't smoke, exercise, or drink caffeine 30 minutes before checking your BP Use the restroom before checking your BP (a full bladder can raise your pressure) Relax in a comfortable upright chair Feet on the ground Left arm resting comfortably on a flat surface at the level of your heart Legs uncrossed Back supported Sit quietly and don't talk Place the cuff on your bare arm Adjust snuggly, so that only two fingertips can fit between your skin and the top of the cuff Check 2 readings separated by at least one minute Keep a log of your BP readings For a visual, please reference this diagram: http://ccnc.care/bpdiagram  Provider Name: [facility]     Phone: [phone]  Zone 1: ALL CLEAR  Continue to monitor your symptoms:  BP reading is less than 140 (top number) or less than 90 (bottom  number)  No right upper stomach pain No headaches or seeing spots No feeling nauseated or throwing up No swelling in face and hands  Zone 2: CAUTION Call your doctor's office for any of the following:  BP reading is greater than 140 (top number) or greater than 90 (bottom number)  Stomach pain under your ribs in the middle or right side Headaches or seeing spots Feeling nauseated or throwing up Swelling in face and hands  Zone 3: EMERGENCY  Seek immediate medical care if you have any of the following:  BP reading is greater than160 (top number) or greater than  110 (bottom number) Severe headaches not improving with Tylenol Serious difficulty catching your breath Any worsening symptoms from Zone 2    First Trimester of Pregnancy The first trimester of pregnancy is from week 1 until the end of week 12 (months 1 through 3). A week after a sperm fertilizes an egg, the egg will implant on the wall of the uterus. This embryo will begin to develop into a baby. Genes from you and your partner are forming the baby. The female genes determine whether the baby is a boy or a girl. At 6-8 weeks, the eyes and face are formed, and the heartbeat can be seen on ultrasound. At the end of 12 weeks, all the baby's organs are formed.  Now that you are pregnant, you will want to do everything you can to have a healthy baby. Two of the most important things are to get good prenatal care and to follow your health care provider's instructions. Prenatal care is all the medical care you receive before the baby's birth. This care will help prevent, find, and treat any problems during the pregnancy and childbirth. BODY CHANGES Your body goes through many changes during pregnancy. The changes vary from woman to woman.  You may gain or lose a couple of pounds at first. You may feel sick to your stomach (nauseous) and throw up (vomit). If the vomiting is uncontrollable, call your health care provider. You may tire easily. You may develop headaches that can be relieved by medicines approved by your health care provider. You may urinate more often. Painful urination may mean you have a bladder infection. You may develop heartburn as a result of your pregnancy. You may develop constipation because certain hormones are causing the muscles that push waste through your intestines to slow down. You may develop hemorrhoids or swollen, bulging veins (varicose veins). Your breasts may begin to grow larger and become tender. Your nipples may stick out more, and the tissue that surrounds them  (areola) may become darker. Your gums may bleed and may be sensitive to brushing and flossing. Dark spots or blotches (chloasma, mask of pregnancy) may develop on your face. This will likely fade after the baby is born. Your menstrual periods will stop. You may have a loss of appetite. You may develop cravings for certain kinds of food. You may have changes in your emotions from day to day, such as being excited to be pregnant or being concerned that something may go wrong with the pregnancy and baby. You may have more vivid and strange dreams. You may have changes in your hair. These can include thickening of your hair, rapid growth, and changes in texture. Some women also have hair loss during or after pregnancy, or hair that feels dry or thin. Your hair will most likely return to normal after your baby is born. WHAT TO EXPECT AT YOUR PRENATAL  VISITS During a routine prenatal visit: You will be weighed to make sure you and the baby are growing normally. Your blood pressure will be taken. Your abdomen will be measured to track your baby's growth. The fetal heartbeat will be listened to starting around week 10 or 12 of your pregnancy. Test results from any previous visits will be discussed. Your health care provider may ask you: How you are feeling. If you are feeling the baby move. If you have had any abnormal symptoms, such as leaking fluid, bleeding, severe headaches, or abdominal cramping. If you have any questions. Other tests that may be performed during your first trimester include: Blood tests to find your blood type and to check for the presence of any previous infections. They will also be used to check for low iron levels (anemia) and Rh antibodies. Later in the pregnancy, blood tests for diabetes will be done along with other tests if problems develop. Urine tests to check for infections, diabetes, or protein in the urine. An ultrasound to confirm the proper growth and development  of the baby. An amniocentesis to check for possible genetic problems. Fetal screens for spina bifida and Down syndrome. You may need other tests to make sure you and the baby are doing well. HOME CARE INSTRUCTIONS  Medicines Follow your health care provider's instructions regarding medicine use. Specific medicines may be either safe or unsafe to take during pregnancy. Take your prenatal vitamins as directed. If you develop constipation, try taking a stool softener if your health care provider approves. Diet Eat regular, well-balanced meals. Choose a variety of foods, such as meat or vegetable-based protein, fish, milk and low-fat dairy products, vegetables, fruits, and whole grain breads and cereals. Your health care provider will help you determine the amount of weight gain that is right for you. Avoid raw meat and uncooked cheese. These carry germs that can cause birth defects in the baby. Eating four or five small meals rather than three large meals a day may help relieve nausea and vomiting. If you start to feel nauseous, eating a few soda crackers can be helpful. Drinking liquids between meals instead of during meals also seems to help nausea and vomiting. If you develop constipation, eat more high-fiber foods, such as fresh vegetables or fruit and whole grains. Drink enough fluids to keep your urine clear or pale yellow. Activity and Exercise Exercise only as directed by your health care provider. Exercising will help you: Control your weight. Stay in shape. Be prepared for labor and delivery. Experiencing pain or cramping in the lower abdomen or low back is a good sign that you should stop exercising. Check with your health care provider before continuing normal exercises. Try to avoid standing for long periods of time. Move your legs often if you must stand in one place for a long time. Avoid heavy lifting. Wear low-heeled shoes, and practice good posture. You may continue to have sex  unless your health care provider directs you otherwise. Relief of Pain or Discomfort Wear a good support bra for breast tenderness.   Take warm sitz baths to soothe any pain or discomfort caused by hemorrhoids. Use hemorrhoid cream if your health care provider approves.   Rest with your legs elevated if you have leg cramps or low back pain. If you develop varicose veins in your legs, wear support hose. Elevate your feet for 15 minutes, 3-4 times a day. Limit salt in your diet. Prenatal Care Schedule your prenatal visits by the  twelfth week of pregnancy. They are usually scheduled monthly at first, then more often in the last 2 months before delivery. Write down your questions. Take them to your prenatal visits. Keep all your prenatal visits as directed by your health care provider. Safety Wear your seat belt at all times when driving. Make a list of emergency phone numbers, including numbers for family, friends, the hospital, and police and fire departments. General Tips Ask your health care provider for a referral to a local prenatal education class. Begin classes no later than at the beginning of month 6 of your pregnancy. Ask for help if you have counseling or nutritional needs during pregnancy. Your health care provider can offer advice or refer you to specialists for help with various needs. Do not use hot tubs, steam rooms, or saunas. Do not douche or use tampons or scented sanitary pads. Do not cross your legs for long periods of time. Avoid cat litter boxes and soil used by cats. These carry germs that can cause birth defects in the baby and possibly loss of the fetus by miscarriage or stillbirth. Avoid all smoking, herbs, alcohol, and medicines not prescribed by your health care provider. Chemicals in these affect the formation and growth of the baby. Schedule a dentist appointment. At home, brush your teeth with a soft toothbrush and be gentle when you floss. SEEK MEDICAL CARE IF:   You have dizziness. You have mild pelvic cramps, pelvic pressure, or nagging pain in the abdominal area. You have persistent nausea, vomiting, or diarrhea. You have a bad smelling vaginal discharge. You have pain with urination. You notice increased swelling in your face, hands, legs, or ankles. SEEK IMMEDIATE MEDICAL CARE IF:  You have a fever. You are leaking fluid from your vagina. You have spotting or bleeding from your vagina. You have severe abdominal cramping or pain. You have rapid weight gain or loss. You vomit blood or material that looks like coffee grounds. You are exposed to [place] measles and have never had them. You are exposed to fifth disease or chickenpox. You develop a severe headache. You have shortness of breath. You have any kind of trauma, such as from a fall or a car accident. Document Released: 02/08/2001 Document Revised: 07/01/2013 Document Reviewed: 12/25/2012 [facility] Patient Information 2015 [place], [place]. This information is not intended to replace advice given to you by your health care provider. Make sure you discuss any questions you have with your health care provider.

## 2020-12-29 NOTE — Progress Notes (Signed)
[place] 12+3 wks,measurements c/w dates,CRL 68.33 mm,NB present,NT 1.4 mm,normal ovaries,fhr 157 bpm

## 2020-12-29 NOTE — Progress Notes (Signed)
INITIAL OBSTETRICAL VISIT Patient name: Cheyenne Bauer MRN [id number]  Date of birth: 1990-11-30 Chief Complaint:   Initial Prenatal Visit (hyperemesisi)  History of Present Illness:   Cheyenne Bauer is a 30 y.o. G52P1021 Caucasian female at [redacted]w[redacted]d by LMP c/w u/s at 8 weeks with an Estimated Date of Delivery: 07/10/21 being seen today for her initial obstetrical visit.   Patient's last menstrual period was 10/03/2020 (exact date). Her obstetrical history is significant for  IAB, SAB, term SVB after IOL for GHTN  .   Today she reports  n/v throughout the day, able to eat at night. Has scop patch and bonjesta . 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 Some occ thoughts of self-harm, no plan, doesn't feel she would act on thoughts.   Last pap 07/20/20. Results were: NILM w/ HRHPV negative  Depression screen [facility] 2/9 12/29/2020 07/20/2020 05/14/2018 04/18/2018 04/07/2017  Decreased Interest 1 1 2 3 3   Down, Depressed, Hopeless 1 1 2 3 3   PHQ - 2 Score 2 2 4 6 6   Altered sleeping 1 1 3 3 3   Tired, decreased energy 1 1 3 3 2   Change in appetite 1 1 3 3 2   Feeling bad or failure about yourself  1 1 3 3 3   Trouble concentrating 1 1 3 3  0  Moving slowly or fidgety/restless 1 1 0 0 1  Suicidal thoughts 1 1 0 0 1  PHQ-9 Score 9 9 19 21 18   Difficult doing work/chores - - - - Somewhat difficult     GAD 7 : Generalized Anxiety Score 12/29/2020 07/20/2020  Nervous, Anxious, on Edge 1 1  Control/stop worrying 1 1  Worry too much - different things 1 1  Trouble relaxing 1 1  Restless 1 1  Easily annoyed or irritable 1 1  Afraid - awful might happen 1 1  Total GAD 7 Score 7 7     Review of Systems:   Pertinent items are noted in HPI Denies cramping/contractions, leakage of fluid, vaginal bleeding, abnormal vaginal discharge w/ itching/odor/irritation, headaches, visual changes, shortness of  breath, chest pain, abdominal pain, severe nausea/vomiting, or problems with urination or bowel movements unless otherwise stated above.  Pertinent History Reviewed:  Reviewed past medical,surgical, social, obstetrical and family history.  Reviewed problem list, medications and allergies. OB History  Gravida Para Term Preterm AB Living  4 1 1   2 1   SAB IAB Ectopic Multiple Live Births  1 1   0 1    # Outcome Date GA Lbr Len/2nd Weight Sex Delivery Anes PTL Lv  4 Current           3 Term 12/08/18 [redacted]w[redacted]d 08:15 / 00:41 7 lb 6 oz (3.345 kg) M Vag-Spont EPI N LIV     Birth Comments: none     Complications: Gestational hypertension  2 SAB 01/28/18          1 IAB            Physical Assessment:   Vitals:   12/29/20 1415  BP: 123/79  Pulse: 83  Weight: 158 lb (71.7 kg)  Body mass index is 25.89 kg/m.       Physical Examination:  General appearance - well appearing, and in no distress  Mental status - alert, oriented to person, place, and time  Psych:  She has a  normal mood and affect  Skin - warm and dry, normal color, no suspicious lesions noted  Chest - effort normal, all lung fields clear to auscultation bilaterally  Heart - normal rate and regular rhythm  Abdomen - soft, nontender  Extremities:  No swelling or varicosities noted  Thin prep pap is not done   Chaperone: N/A    TODAY'S NT [place] 12+3 wks,measurements c/w dates,CRL 68.33 mm,NB present,NT 1.4 mm,normal ovaries,fhr 157 bpm  Results for orders placed or performed in visit on 12/29/20 (from the past 24 hour(s))  POC Urinalysis Dipstick OB   Collection Time: 12/29/20  3:26 PM  Result Value Ref Range   Color, UA     Clarity, UA     Glucose, UA Negative Negative   Bilirubin, UA     Ketones, UA neg    Spec Grav, UA     Blood, UA neg    pH, UA     POC,PROTEIN,UA Negative Negative, Trace, Small (1+), Moderate (2+), Large (3+), 4+   Urobilinogen, UA     Nitrite, UA neg    Leukocytes, UA Negative Negative    Appearance     Odor      Assessment & Plan:  1) Low-Risk Pregnancy [id number] at [redacted]w[redacted]d with an Estimated Date of Delivery: 07/10/21   2) Initial OB visit  3) H/O gHTN> start ASA 162mg , baseline labs today  4) Dep/anx/BPD, occ thoughts self-harm>no plan, stopped meds/therapy w/ +HPT, does not want to resume meds during pregnancy. IBH referral entered/note routed to [facility]. Pt states she will let [facility] know if she feels it gets bad enough she wants to resume meds  5) N/V> doing well overall w/ bonjesta and scop patch  Meds:  Meds ordered this encounter  Medications   aspirin 81 MG EC tablet    Sig: Take 2 tablets (162 mg total) by mouth daily. Swallow whole.    Dispense:  180 tablet    Refill:  2    Order Specific Question:   Supervising Provider    Answer:   [place] H [[id number]]    Initial labs obtained Continue prenatal vitamins Reviewed n/v relief measures and warning s/s to report Reviewed recommended weight gain based on pre-gravid BMI Encouraged well-balanced diet Genetic & carrier screening discussed: requests Panorama and NT/IT, declines Horizon  Ultrasound discussed; fetal survey: requested CCNC completed> form faxed if has or is planning to apply for medicaid The nature of [facility] with multiple MDs and other Advanced Practice Providers was explained to patient; also emphasized that fellows, residents, and students are part of our team. Does have home bp cuff. Office bp cuff given: no. Rx sent: no. Check bp weekly, let [employer] know if consistently >140/90.   Follow-up: Return in about 4 weeks (around 01/26/2021) for LROB, 2nd IT, CNM, in person.   Orders Placed This Encounter  Procedures   Urine Culture   GC/Chlamydia Probe Amp   Integrated 1   Protein / creatinine ratio, urine   Genetic Screening   Comprehensive metabolic panel   CBC/D/Plt+RPR+Rh+ABO+RubIgG...   Amb ref to [facility]   POC Urinalysis Dipstick OB     01/28/2021 CNM, [facility] 12/29/2020 4:02 PM

## 2020-12-31 LAB — CBC/D/PLT+RPR+RH+ABO+RUBIGG...
Antibody Screen: NEGATIVE
Basophils Absolute: 0 10*3/uL (ref 0.0–0.2)
Basos: 1 %
EOS (ABSOLUTE): 0.1 10*3/uL (ref 0.0–0.4)
Eos: 1 %
HCV Ab: <0.1 s/co ratio (ref 0.0–0.9)
HIV Screen 4th Generation wRfx: NONREACTIVE
Hematocrit: 39.5 % (ref 34.0–46.6)
Hemoglobin: 13.5 g/dL (ref 11.1–15.9)
Hepatitis B Surface Ag: NEGATIVE
Immature Grans (Abs): 0 10*3/uL (ref 0.0–0.1)
Immature Granulocytes: 0 %
Lymphocytes Absolute: 1.9 10*3/uL (ref 0.7–3.1)
Lymphs: 23 %
MCH: 30.9 pg (ref 26.6–33.0)
MCHC: 34.2 g/dL (ref 31.5–35.7)
MCV: 90 fL (ref 79–97)
Monocytes Absolute: 0.4 10*3/uL (ref 0.1–0.9)
Monocytes: 5 %
Neutrophils Absolute: 5.7 10*3/uL (ref 1.4–7.0)
Neutrophils: 70 %
Platelets: 329 10*3/uL (ref 150–450)
RBC: 4.37 x10E6/uL (ref 3.77–5.28)
RDW: 12 % (ref 11.7–15.4)
RPR Ser Ql: NONREACTIVE
Rh Factor: POSITIVE
Rubella Antibodies, IGG: <0.9 index — ABNORMAL LOW (ref >0.99)
WBC: 8.3 10*3/uL (ref 3.4–10.8)

## 2020-12-31 LAB — INTEGRATED 1
Crown Rump Length: 68.3 mm
Gest. Age on Collection Date: 12.9 weeks
Maternal Age at EDD: 29.2 yr
Nuchal Translucency (NT): 1.4 mm
Number of Fetuses: 1
PAPP-A Value: 1264.1 ng/mL
Weight: 158 [lb_av]

## 2020-12-31 LAB — GC/CHLAMYDIA PROBE AMP
Chlamydia trachomatis, NAA: NEGATIVE
Neisseria Gonorrhoeae by PCR: NEGATIVE

## 2020-12-31 LAB — COMPREHENSIVE METABOLIC PANEL
ALT: 13 IU/L (ref 0–32)
AST: 15 IU/L (ref 0–40)
Albumin/Globulin Ratio: 1.6 (ref 1.2–2.2)
Albumin: 4.2 g/dL (ref 3.9–5.0)
Alkaline Phosphatase: 61 IU/L (ref 44–121)
BUN/Creatinine Ratio: 15 (ref 9–23)
BUN: 8 mg/dL (ref 6–20)
Bilirubin Total: <0.2 mg/dL (ref 0.0–1.2)
CO2: 19 mmol/L — ABNORMAL LOW (ref 20–29)
Calcium: 9.2 mg/dL (ref 8.7–10.2)
Chloride: 103 mmol/L (ref 96–106)
Creatinine, Ser: 0.55 mg/dL — ABNORMAL LOW (ref 0.57–1.00)
Globulin, Total: 2.7 g/dL (ref 1.5–4.5)
Glucose: 83 mg/dL (ref 70–99)
Potassium: 4.6 mmol/L (ref 3.5–5.2)
Sodium: 137 mmol/L (ref 134–144)
Total Protein: 6.9 g/dL (ref 6.0–8.5)
eGFR: 128 mL/min/{1.73_m2} (ref >59)

## 2020-12-31 LAB — HCV INTERPRETATION

## 2020-12-31 LAB — PROTEIN / CREATININE RATIO, URINE
Creatinine, Urine: 163.6 mg/dL
Protein, Ur: 10.1 mg/dL
Protein/Creat Ratio: 62 mg/g creat (ref 0–200)

## 2021-01-01 NOTE — BH Specialist Note (Addendum)
Integrated Behavioral Health via Telemedicine Visit  01/01/2021 Cheyenne Bauer [id number]  Number of Integrated Behavioral Health visits: 1 Session Start time: 2:15  Session End time: 2:57 Total time:  57  Referring Provider: Shawna Clamp, CNM Patient/Family location: Home [facility] Provider location: [facility]  All persons participating in visit: Patient Cheyenne Bauer and [facility] Cheyenne Bauer   Types of Service: Individual psychotherapy and Telephone visit  I connected with Dayton Martes and/or Clemens Catholic Dishon's  n/a  via  Telephone or Video Enabled Telemedicine Application  (Video is Caregility application) and verified that I am speaking with the correct person using two identifiers. Discussed confidentiality: Yes   I discussed the limitations of telemedicine and the availability of in person appointments.  Discussed there is a possibility of technology failure and discussed alternative modes of communication if that failure occurs.  I discussed that engaging in this telemedicine visit, they consent to the provision of behavioral healthcare and the services will be billed under their insurance.  Patient and/or legal guardian expressed understanding and consented to Telemedicine visit: Yes   Presenting Concerns: Patient and/or family reports the following symptoms/concerns: Managing emotional wellness while unmedicated during pregnancy; pt will return to therapy and BH medication management postpartum; open to implementing self-coping strategy for greater control over current emotions.  Duration of problem: Ongoing; Severity of problem: moderate  Patient and/or Family's Strengths/Protective Factors: Social connections, Concrete supports in place (healthy food, safe environments, etc.), and Sense of purpose  Goals Addressed: Patient will:  Reduce symptoms of: anxiety, depression, and stress   Increase knowledge and/or ability of:  coping skills   Demonstrate ability to: Increase healthy adjustment to current life circumstances  Progress towards Goals: Ongoing  Interventions: Interventions utilized:  Mindfulness or Management consultant and Psychoeducation and/or Health Education Standardized Assessments completed:  PHQ9/GAD7 given in past two weeks  Patient and/or Family Response: Pt agrees with treatment plan  Assessment: Patient currently experiencing History of Borderline personality disorder and History of PTSD, both as previously diagnosed.   Patient may benefit from psychoeducation and brief therapeutic interventions regarding coping with symptoms of anxiety, depression, life stress .  Plan: Follow up with behavioral health clinician on : One month; Call Taleisha Kaczynski at [phone], as needed. Behavioral recommendations:  -Continue taking prenatal vitamin as prescribed -CALM relaxation breathing exercise twice daily (morning; at bedtime); as needed throughout the day -Consider using apps (on After Visit Summary) and/or prenatal yoga classes of choice, for additional coping Referral(s): Integrated [employer] (In Clinic)  I discussed the assessment and treatment plan with the patient and/or parent/guardian. They were provided an opportunity to ask questions and all were answered. They agreed with the plan and demonstrated an understanding of the instructions.   They were advised to call back or seek an in-person evaluation if the symptoms worsen or if the condition fails to improve as anticipated.  Rae Lips, LCSW  Depression screen [facility] 2/9 12/29/2020 07/20/2020 05/14/2018 04/18/2018 04/07/2017  Decreased Interest 1 1 2 3 3   Down, Depressed, Hopeless 1 1 2 3 3   PHQ - 2 Score 2 2 4 6 6   Altered sleeping 1 1 3 3 3   Tired, decreased energy 1 1 3 3 2   Change in appetite 1 1 3 3 2   Feeling bad or failure about yourself  1 1 3 3 3   Trouble concentrating 1 1 3 3  0  Moving slowly or  fidgety/restless 1 1  0 0 1  Suicidal thoughts 1 1 0 0 1  PHQ-9 Score 9 9 19 21 18   Difficult doing work/chores - - - - Somewhat difficult   GAD 7 : Generalized Anxiety Score 12/29/2020 07/20/2020  Nervous, Anxious, on Edge 1 1  Control/stop worrying 1 1  Worry too much - different things 1 1  Trouble relaxing 1 1  Restless 1 1  Easily annoyed or irritable 1 1  Afraid - awful might happen 1 1  Total GAD 7 Score 7 7

## 2021-01-03 LAB — URINE CULTURE

## 2021-01-11 ENCOUNTER — Ambulatory Visit (INDEPENDENT_AMBULATORY_CARE_PROVIDER_SITE_OTHER): Payer: BC Managed Care – PPO | Admitting: Clinical

## 2021-01-11 DIAGNOSIS — Z8659 Personal history of other mental and behavioral disorders: Secondary | ICD-10-CM

## 2021-01-11 DIAGNOSIS — O99340 Other mental disorders complicating pregnancy, unspecified trimester: Secondary | ICD-10-CM

## 2021-01-11 DIAGNOSIS — F603 Borderline personality disorder: Secondary | ICD-10-CM

## 2021-01-11 DIAGNOSIS — Z3A00 Weeks of gestation of pregnancy not specified: Secondary | ICD-10-CM | POA: Diagnosis not present

## 2021-01-11 NOTE — Patient Instructions (Addendum)
Center for [facility] at [facility] [address] [place], [place] [address] [phone] (main office) [phone] [facility] office)  [email or web address]    At our [facility], we work as an integrated team, providing care to address both physical and emotional health. Your medical provider may refer you to see our Behavioral Health Clinician [facility]) on the same day you see your medical provider, as availability permits; often scheduled virtually at your convenience.  Our [facility] is available to all patients, visits generally last between 20-30 minutes, but can be longer or shorter, depending on patient need. The [facility] offers help with stress management, coping with symptoms of depression and anxiety, major life changes , sleep issues, changing risky behavior, grief and loss, life stress, working on personal life goals, and  behavioral health issues, as these all affect your overall health and wellness.  The [facility] is NOT available for the following: FMLA paperwork, court-ordered evaluations, specialty assessments (custody or disability), letters to employers, or obtaining certification for an emotional support animal. The [facility] does not provide long-term therapy. You have the right to refuse integrated behavioral health services, or to reschedule to see the [facility] at a later date.  Confidentiality exception: If it is suspected that a child or disabled adult is being abused or neglected, we are required by law to report that to either Child Protective Services or Adult Management consultant.  If you have a diagnosis of Bipolar affective disorder, Schizophrenia, or recurrent Major depressive disorder, we will recommend that you establish care with a psychiatrist, as these are lifelong, chronic conditions, and we want your overall emotional health and medications to be more closely monitored. If you anticipate needing extended maternity leave due to mental health issues postpartum,  it it recommended you inform your medical provider, so we can put in a referral to a psychiatrist as soon as possible. The [facility] is unable to recommend an extended maternity leave for mental health issues. Your medical provider or [facility] may refer you to a therapist for ongoing, traditional therapy, or to a psychiatrist, for medication management, if it would benefit your overall health. Depending on your insurance, you may have a copay or be charged a deductible, depending on your insurance, to see the [facility]. If you are uninsured, it is recommended that you apply for financial assistance. (Forms may be requested at the front desk for in-person visits, via MyChart, or request a form during a virtual visit).  If you see the [facility] more than 6 times, you will have to complete a comprehensive clinical assessment interview with the [facility] to resume integrated services.  For virtual visits with the [facility], you must be physically in the state of [place] at the time of the visit. For example, if you live in [place], you will have to do an in-person visit with the [facility], and your out-of-state insurance may not cover behavioral health services in [place]. If you are going out of the state or country for any reason, the [facility] may see you virtually when you return to [place], but not while you are physically outside of [place].    24/7 Behavioral Health Crisis Centers by [place]: [place]: - [facility] (24/7): [phone]   [address] [place], [place] [address] - [place] 24 Hour Crisis Line: [phone]  [facility]:  - [facility] (24/7): [phone]   [address] [place], [place] [address] - [phone]  Hour Crisis Line: [phone]       Emotional Wellbeing Apps and Websites Here are a few free apps meant to help you to help yourself.  To find, try searching on the internet to see if the app is offered on  Apple/Android devices. If your first choice doesn't come up on your device, the good news is that there are many choices! Play around with different apps to see which ones are helpful to you.    Calm This is an app meant to help increase calm feelings. Includes info, strategies, and tools for tracking your feelings.      Calm Harm  This app is meant to help with self-harm. Provides many 5-minute or 15-min coping strategies for doing instead of hurting yourself.       Healthy Minds Health Minds is a problem-solving tool to help deal with emotions and cope with stress you encounter wherever you are.      MindShift This app can help people cope with anxiety. Rather than trying to avoid anxiety, you can make an important shift and face it.      MY3  MY3 features a support system, safety plan and resources with the goal of offering a tool to use in a time of need.       My Life My Voice  This mood journal offers a simple solution for tracking your thoughts, feelings and moods. Animated emoticons can help identify your mood.       Relax Melodies Designed to help with sleep, on this app you can mix sounds and meditations for relaxation.      Smiling Mind Smiling Mind is meditation made easy: it's a simple tool that helps put a smile on your mind.        Stop, Breathe & Think  A friendly, simple guide for people through meditations for mindfulness and compassion.  Stop, Breathe and Think Kids Enter your current feelings and choose a "mission" to help you cope. Offers videos for certain moods instead of just sound recordings.       Team Orange The goal of this tool is to help teens change how they think, act, and react. This app helps you focus on your own good feelings and experiences.      The [employer] The [employer] (VHB) contains simple tools to help patients with coping, relaxation, distraction, and positive thinking.

## 2021-01-27 NOTE — BH Specialist Note (Deleted)
Integrated Behavioral Health via Telemedicine Visit  01/27/2021 Cheyenne Bauer [id number]  Number of Integrated Behavioral Health visits: *** Session Start time: 1:45***  Session End time: 2:15*** Total time: {IBH Total Time:21014050}  Referring Provider: *** Patient/Family location: Home*** [facility] Provider location: [facility]  All persons participating in visit: Patient *** and [facility] Niccolo Burggraf ***  Types of Service: {CHL AMB TYPE OF SERVICE:[phone]}  I connected with Dayton Martes and/or Sarae L Mroczkowski's {family members:20773} via  Telephone or Engineer, civil (consulting)  (Video is Surveyor, mining) and verified that I am speaking with the correct person using two identifiers. Discussed confidentiality: {YES/NO:21197}  I discussed the limitations of telemedicine and the availability of in person appointments.  Discussed there is a possibility of technology failure and discussed alternative modes of communication if that failure occurs.  I discussed that engaging in this telemedicine visit, they consent to the provision of behavioral healthcare and the services will be billed under their insurance.  Patient and/or legal guardian expressed understanding and consented to Telemedicine visit: {YES/NO:21197}  Presenting Concerns: Patient and/or family reports the following symptoms/concerns: *** Duration of problem: ***; Severity of problem: {Mild/Moderate/Severe:20260}  Patient and/or Family's Strengths/Protective Factors: {CHL AMB BH PROTECTIVE [phone]}  Goals Addressed: Patient will:  Reduce symptoms of: {IBH Symptoms:21014056}   Increase knowledge and/or ability of: {IBH Patient Tools:21014057}   Demonstrate ability to: {IBH Goals:21014053}  Progress towards Goals: {CHL AMB BH PROGRESS TOWARDS GOALS:[phone]}  Interventions: Interventions utilized:  {IBH  Interventions:21014054} Standardized Assessments completed: {IBH Screening Tools:21014051}  Patient and/or Family Response: ***  Assessment: Patient currently experiencing ***.   Patient may benefit from ***.  Plan: Follow up with behavioral health clinician on : *** Behavioral recommendations: *** Referral(s): {IBH Referrals:21014055}  I discussed the assessment and treatment plan with the patient and/or parent/guardian. They were provided an opportunity to ask questions and all were answered. They agreed with the plan and demonstrated an understanding of the instructions.   They were advised to call back or seek an in-person evaluation if the symptoms worsen or if the condition fails to improve as anticipated.  Valetta Close Aviraj Kentner, LCSW

## 2021-02-01 ENCOUNTER — Ambulatory Visit (INDEPENDENT_AMBULATORY_CARE_PROVIDER_SITE_OTHER): Payer: BC Managed Care – PPO | Admitting: Women's Health

## 2021-02-01 ENCOUNTER — Other Ambulatory Visit: Payer: Self-pay

## 2021-02-01 ENCOUNTER — Encounter: Payer: Self-pay | Admitting: Women's Health

## 2021-02-01 VITALS — BP 111/70 | HR 109 | Wt 169.5 lb

## 2021-02-01 DIAGNOSIS — Z1379 Encounter for other screening for genetic and chromosomal anomalies: Secondary | ICD-10-CM

## 2021-02-01 DIAGNOSIS — Z363 Encounter for antenatal screening for malformations: Secondary | ICD-10-CM

## 2021-02-01 DIAGNOSIS — Z3480 Encounter for supervision of other normal pregnancy, unspecified trimester: Secondary | ICD-10-CM

## 2021-02-01 DIAGNOSIS — Z3482 Encounter for supervision of other normal pregnancy, second trimester: Secondary | ICD-10-CM

## 2021-02-01 NOTE — Patient Instructions (Addendum)
Cheyenne Bauer, thank you for choosing our office today! We appreciate the opportunity to meet your healthcare needs. You may receive a short survey by mail, e-mail, or through [employer]. If you are happy with your care we would appreciate if you could take just a few minutes to complete the survey questions. We read all of your comments and take your feedback very seriously. Thank you again for choosing our office.  Center for [employer] Team at [facility] at [facility] ([address] [place], [place] [address]) Entrance C, located off of [address] 24/7 valet parking   Constipation Drink plenty of fluid, preferably water, throughout the day Eat foods high in fiber such as fruits, vegetables, and grains Exercise, such as walking, is a good way to keep your bowels regular Drink warm fluids, especially warm prune juice, or decaf coffee Eat a 1/2 cup of real oatmeal (not instant), 1/2 cup applesauce, and 1/2-1 cup warm prune juice every day If needed, you may take Colace (docusate sodium) stool softener once or twice a day to help keep the stool soft. If you are pregnant, wait until you are out of your first trimester (12-14 weeks of pregnancy) If you still are having problems with constipation, you may take Miralax once daily as needed to help keep your bowels regular.  If you are pregnant, wait until you are out of your first trimester (12-14 weeks of pregnancy)   Go to [email or web address] to register for FREE online childbirth classes  Call the office [phone]) or go to [facility] if: You begin to severe cramping Your water breaks.  Sometimes it is a big gush of fluid, sometimes it is just a trickle that keeps getting your panties wet or running down your legs You have vaginal bleeding.  It is normal to have a small amount of spotting if your cervix was checked.   [facility] Pediatricians/Family Doctors [facility]): [address]  Colette Ribas, [phone]           [facility]: [address], [phone]                [facility]): [address], [phone] (call to ask if accepting patients) [facility] Department: [address], [place], [phone]    [facility] Pediatricians/Family Doctors [facility]): [phone] [address], [phone] [facility]: [address] [place], [phone] [facility]: [address], [phone]  [facility] Doctors  [facility]): [phone] [facility]: [address], [phone]   [facility] Doctors [facility]: [address] [phone]  [facility] Doctors  [facility]: [phone], [phone]  Home Blood Pressure Monitoring for Patients   Your provider has recommended that you check your blood pressure (BP) at least once a week at home. If you do not have a blood pressure cuff at home, one will be provided for you. Contact your provider if you have not received your monitor within 1 week.   Helpful Tips for Accurate Home Blood Pressure Checks  Don't smoke, exercise, or drink caffeine 30 minutes before checking your BP Use the restroom before checking your BP (a full bladder can raise your pressure) Relax in a comfortable upright chair Feet on the ground Left arm resting comfortably on a flat surface at the level of your  heart Legs uncrossed Back supported Sit quietly and don't talk Place the cuff on your bare arm Adjust snuggly, so that only two fingertips can fit between your skin and the top of the cuff Check 2 readings separated by at least one minute Keep a log of your BP readings For a visual, please reference this diagram: http://ccnc.care/bpdiagram  Provider Name: [facility]     Phone: [phone]  Zone 1: ALL CLEAR   Continue to monitor your symptoms:  BP reading is less than 140 (top number) or less than 90 (bottom number)  No right upper stomach pain No headaches or seeing spots No feeling nauseated or throwing up No swelling in face and hands  Zone 2: CAUTION Call your doctor's office for any of the following:  BP reading is greater than 140 (top number) or greater than 90 (bottom number)  Stomach pain under your ribs in the middle or right side Headaches or seeing spots Feeling nauseated or throwing up Swelling in face and hands  Zone 3: EMERGENCY  Seek immediate medical care if you have any of the following:  BP reading is greater than160 (top number) or greater than 110 (bottom number) Severe headaches not improving with Tylenol Serious difficulty catching your breath Any worsening symptoms from Zone 2     Second Trimester of Pregnancy The second trimester is from week 14 through week 27 (months 4 through 6). The second trimester is often a time when you feel your best. Your body has adjusted to being pregnant, and you begin to feel better physically. Usually, morning sickness has lessened or quit completely, you may have more energy, and you may have an increase in appetite. The second trimester is also a time when the fetus is growing rapidly. At the end of the sixth month, the fetus is about 9 inches long and weighs about 1 pounds. You will likely begin to feel the baby move (quickening) between 16 and 20 weeks of pregnancy. Body changes during your second trimester Your body continues to go through many changes during your second trimester. The changes vary from woman to woman. Your weight will continue to increase. You will notice your lower abdomen bulging out. You may begin to get stretch marks on your hips, abdomen, and breasts. You may develop headaches that can be relieved by medicines. The medicines should be approved by your health care provider. You may urinate more often  because the fetus is pressing on your bladder. You may develop or continue to have heartburn as a result of your pregnancy. You may develop constipation because certain hormones are causing the muscles that push waste through your intestines to slow down. You may develop hemorrhoids or swollen, bulging veins (varicose veins). You may have back pain. This is caused by: Weight gain. Pregnancy hormones that are relaxing the joints in your pelvis. A shift in weight and the muscles that support your balance. Your breasts will continue to grow and they will continue to become tender. Your gums may bleed and may be sensitive to brushing and flossing. Dark spots or blotches (chloasma, mask of pregnancy) may develop on your face. This will likely fade after the baby is born. A dark line from your belly button to the pubic area (linea nigra) may appear. This will likely fade after the baby is born. You may have changes in your hair. These can include thickening of your hair, rapid growth, and changes in texture. Some women also have hair loss during  or after pregnancy, or hair that feels dry or thin. Your hair will most likely return to normal after your baby is born.  What to expect at prenatal visits During a routine prenatal visit: You will be weighed to make sure you and the fetus are growing normally. Your blood pressure will be taken. Your abdomen will be measured to track your baby's growth. The fetal heartbeat will be listened to. Any test results from the previous visit will be discussed.  Your health care provider may ask you: How you are feeling. If you are feeling the baby move. If you have had any abnormal symptoms, such as leaking fluid, bleeding, severe headaches, or abdominal cramping. If you are using any tobacco products, including cigarettes, chewing tobacco, and electronic cigarettes. If you have any questions.  Other tests that may be performed during your second trimester  include: Blood tests that check for: Low iron levels (anemia). High blood sugar that affects pregnant women (gestational diabetes) between 30 and 28 weeks. Rh antibodies. This is to check for a protein on red blood cells (Rh factor). Urine tests to check for infections, diabetes, or protein in the urine. An ultrasound to confirm the proper growth and development of the baby. An amniocentesis to check for possible genetic problems. Fetal screens for spina bifida and Down syndrome. HIV (human immunodeficiency virus) testing. Routine prenatal testing includes screening for HIV, unless you choose not to have this test.  Follow these instructions at home: Medicines Follow your health care provider's instructions regarding medicine use. Specific medicines may be either safe or unsafe to take during pregnancy. Take a prenatal vitamin that contains at least 600 micrograms (mcg) of folic acid. If you develop constipation, try taking a stool softener if your health care provider approves. Eating and drinking Eat a balanced diet that includes fresh fruits and vegetables, whole grains, good sources of protein such as meat, eggs, or tofu, and low-fat dairy. Your health care provider will help you determine the amount of weight gain that is right for you. Avoid raw meat and uncooked cheese. These carry germs that can cause birth defects in the baby. If you have low calcium intake from food, talk to your health care provider about whether you should take a daily calcium supplement. Limit foods that are high in fat and processed sugars, such as fried and sweet foods. To prevent constipation: Drink enough fluid to keep your urine clear or pale yellow. Eat foods that are high in fiber, such as fresh fruits and vegetables, whole grains, and beans. Activity Exercise only as directed by your health care provider. Most women can continue their usual exercise routine during pregnancy. Try to exercise for 30  minutes at least 5 days a week. Stop exercising if you experience uterine contractions. Avoid heavy lifting, wear low heel shoes, and practice good posture. A sexual relationship may be continued unless your health care provider directs you otherwise. Relieving pain and discomfort Wear a good support bra to prevent discomfort from breast tenderness. Take warm sitz baths to soothe any pain or discomfort caused by hemorrhoids. Use hemorrhoid cream if your health care provider approves. Rest with your legs elevated if you have leg cramps or low back pain. If you develop varicose veins, wear support hose. Elevate your feet for 15 minutes, 3-4 times a day. Limit salt in your diet. Prenatal Care Write down your questions. Take them to your prenatal visits. Keep all your prenatal visits as told by your health care  provider. This is important. Safety Wear your seat belt at all times when driving. Make a list of emergency phone numbers, including numbers for family, friends, the hospital, and police and fire departments. General instructions Ask your health care provider for a referral to a local prenatal education class. Begin classes no later than the beginning of month 6 of your pregnancy. Ask for help if you have counseling or nutritional needs during pregnancy. Your health care provider can offer advice or refer you to specialists for help with various needs. Do not use hot tubs, steam rooms, or saunas. Do not douche or use tampons or scented sanitary pads. Do not cross your legs for long periods of time. Avoid cat litter boxes and soil used by cats. These carry germs that can cause birth defects in the baby and possibly loss of the fetus by miscarriage or stillbirth. Avoid all smoking, herbs, alcohol, and unprescribed drugs. Chemicals in these products can affect the formation and growth of the baby. Do not use any products that contain nicotine or tobacco, such as cigarettes and e-cigarettes. If  you need help quitting, ask your health care provider. Visit your dentist if you have not gone yet during your pregnancy. Use a soft toothbrush to brush your teeth and be gentle when you floss. Contact a health care provider if: You have dizziness. You have mild pelvic cramps, pelvic pressure, or nagging pain in the abdominal area. You have persistent nausea, vomiting, or diarrhea. You have a bad smelling vaginal discharge. You have pain when you urinate. Get help right away if: You have a fever. You are leaking fluid from your vagina. You have spotting or bleeding from your vagina. You have severe abdominal cramping or pain. You have rapid weight gain or weight loss. You have shortness of breath with chest pain. You notice sudden or extreme swelling of your face, hands, ankles, feet, or legs. You have not felt your baby move in over an hour. You have severe headaches that do not go away when you take medicine. You have vision changes. Summary The second trimester is from week 14 through week 27 (months 4 through 6). It is also a time when the fetus is growing rapidly. Your body goes through many changes during pregnancy. The changes vary from woman to woman. Avoid all smoking, herbs, alcohol, and unprescribed drugs. These chemicals affect the formation and growth your baby. Do not use any tobacco products, such as cigarettes, chewing tobacco, and e-cigarettes. If you need help quitting, ask your health care provider. Contact your health care provider if you have any questions. Keep all prenatal visits as told by your health care provider. This is important. This information is not intended to replace advice given to you by your health care provider. Make sure you discuss any questions you have with your health care provider. Document Released: 02/08/2001 Document Revised: 07/23/2015 Document Reviewed: 04/17/2012 Elsevier Interactive Patient Education  2017 [employer].

## 2021-02-01 NOTE — Progress Notes (Signed)
LOW-RISK PREGNANCY VISIT Patient name: Cheyenne Bauer MRN [id number]  Date of birth: 11-06-1990 Chief Complaint:   Routine Prenatal Visit (2nd IT)  History of Present Illness:   Cheyenne Bauer is a 30 y.o. [phone] female at [redacted]w[redacted]d with an Estimated Date of Delivery: 07/10/21 being seen today for ongoing management of a low-risk pregnancy.   Today she reports constipation, starting taking Fe daily b/c was having a lot of sx of anemia, feels much better since starting.  Contractions: Not present. Vag. Bleeding: None.  Movement: Present. denies leaking of fluid.  Depression screen [facility] 2/9 12/29/2020 07/20/2020 05/14/2018 04/18/2018 04/07/2017  Decreased Interest 1 1 2 3 3   Down, Depressed, Hopeless 1 1 2 3 3   PHQ - 2 Score 2 2 4 6 6   Altered sleeping 1 1 3 3 3   Tired, decreased energy 1 1 3 3 2   Change in appetite 1 1 3 3 2   Feeling bad or failure about yourself  1 1 3 3 3   Trouble concentrating 1 1 3 3  0  Moving slowly or fidgety/restless 1 1 0 0 1  Suicidal thoughts 1 1 0 0 1  PHQ-9 Score 9 9 19 21 18   Difficult doing work/chores - - - - Somewhat difficult     GAD 7 : Generalized Anxiety Score 12/29/2020 07/20/2020  Nervous, Anxious, on Edge 1 1  Control/stop worrying 1 1  Worry too much - different things 1 1  Trouble relaxing 1 1  Restless 1 1  Easily annoyed or irritable 1 1  Afraid - awful might happen 1 1  Total GAD 7 Score 7 7      Review of Systems:   Pertinent items are noted in HPI Denies abnormal vaginal discharge w/ itching/odor/irritation, headaches, visual changes, shortness of breath, chest pain, abdominal pain, severe nausea/vomiting, or problems with urination or bowel movements unless otherwise stated above. Pertinent History Reviewed:  Reviewed past medical,surgical, social, obstetrical and family history.  Reviewed problem list, medications and allergies. Physical Assessment:   Vitals:   02/01/21 1142  BP: 111/70  Pulse: (!) 109  Weight: 169 lb 8 oz (76.9  kg)  Body mass index is 27.78 kg/m.        Physical Examination:   General appearance: Well appearing, and in no distress  Mental status: Alert, oriented to person, place, and time  Skin: Warm & dry  Cardiovascular: Normal heart rate noted  Respiratory: Normal respiratory effort, no distress  Abdomen: Soft, gravid, nontender  Pelvic: Cervical exam deferred         Extremities: Edema: None  Fetal Status: Fetal Heart Rate (bpm): 152   Movement: Present    Chaperone: N/A   No results found for this or any previous visit (from the past 24 hour(s)).  Assessment & Plan:  1) Low-risk pregnancy [id number] at [redacted]w[redacted]d with an Estimated Date of Delivery: 07/10/21   2) Constipation, gave printed prevention/relief measures   3) H/O gHTN> continue ASA   Meds: No orders of the defined types were placed in this encounter.  Labs/procedures today: 2nd IT  Plan:  Continue routine obstetrical care  Next visit: prefers will be in person for u/s     Reviewed: Preterm labor symptoms and general obstetric precautions including but not limited to vaginal bleeding, contractions, leaking of fluid and fetal movement were reviewed in detail with the patient.  All questions were answered. Does have home bp cuff. Office bp cuff given: not applicable. Check bp  weekly, let us know if consistently >140 and/or >90.  Follow-up: Return in about 2 weeks (around 02/15/2021) for LROB, YN:[id number], CNM, in person.  Future Appointments  Date Time Provider Department Center  02/08/2021  1:45 PM [facility] HEALTH CLINICIAN [facility]    Orders Placed This Encounter  Procedures   US OB Comp + 14 Wk   INTEGRATED 2   Cheral Marker CNM, [facility] 02/01/2021 11:59 AM

## 2021-02-04 LAB — INTEGRATED 2
AFP MoM: 0.94
Alpha-Fetoprotein: 35.6 ng/mL
Crown Rump Length: 68.3 mm
DIA MoM: 1.2
DIA Value: 173.7 pg/mL
Estriol, Unconjugated: 1.59 ng/mL
Gest. Age on Collection Date: 12.9 weeks
Gestational Age: 17.7 weeks
Maternal Age at EDD: 29.2 yr
Nuchal Translucency (NT): 1.4 mm
Nuchal Translucency MoM: 0.91
Number of Fetuses: 1
PAPP-A MoM: 1.23
PAPP-A Value: 1264.1 ng/mL
Test Results:: NEGATIVE
Weight: 158 [lb_av]
Weight: 170 [lb_av]
hCG MoM: 0.96
hCG Value: 25.2 IU/mL
uE3 MoM: 1.19

## 2021-02-09 ENCOUNTER — Other Ambulatory Visit: Payer: Self-pay | Admitting: Women's Health

## 2021-02-09 ENCOUNTER — Telehealth: Payer: Self-pay | Admitting: *Deleted

## 2021-02-09 MED ORDER — ONDANSETRON HCL 4 MG PO TABS: 4.0000 mg | ORAL_TABLET | Freq: Three times a day (TID) | ORAL | 0 refills | Status: DC | PRN

## 2021-02-09 NOTE — Telephone Encounter (Signed)
Patient states she has tested positive for Covid today and is having a lot of nausea and vomiting.  She has taken the Renningers but is unable to keep it down.  She is requesting dissolvable Zofran as she has taken Phenergan before but it made her feel like she was "coming out of her skin".  Encouraged to sip fluids to avoid dehydration but if she was still unable to keep liquids down and felt dehydrated, she should go to [facility] for possible fluids.  Verbalized understanding and agreeable to plan.

## 2021-02-28 NOTE — L&D Delivery Note (Addendum)
Delivery Note [name] is a 31 y.o. [id number] at [redacted]w[redacted]d admitted for IOL d/t GHTN.  ? ?GBS Status: Negative/-- (04/18 1624) ?Maximum Maternal Temperature: 98.4 ? ?Labor course: Initial SVE: 3/50/-2. Augmentation with: Pitocin. She then progressed to complete.  ?SROM: unknown exact time, sometime between 0050 and 0400, clear ? ?Pt had waited for her sister to get here ?Called by RN @ [phone] to come for delivery, baby delivered by nurses just prior to me entering room ?Birth: At 0501 a viable female was delivered via spontaneous vaginal delivery. Nuchal cord present: No.  Shoulders and body delivered in usual fashion. Infant placed directly on mom's abdomen for bonding/skin-to-skin, baby dried and stimulated. Cord clamped x 2 after 1 minute and cut by FOB.  Cord blood collected.  The placenta separated spontaneously and delivered via gentle cord traction.  Pitocin infused rapidly IV per protocol.  Fundus firm with massage.  ?Placenta inspected and appears to be intact with a 3 VC.  Placenta/Cord with the following complications: none .  Cord pH: not done ?Sponge and instrument count were correct x2. ? ?Intrapartum complications:  None ?Anesthesia:  epidural ?Episiotomy: none ?Lacerations:  1st degree, hemostatic, not repaired ?Suture Repair:  n/a ?EBL (mL): 100 ? ? ?Infant: ?APGAR (1 MIN):  9 ?APGAR (5 MINS):  9 ?APGAR (10 MINS):    ?Infant weight: pending ? ?Mom to postpartum.  Baby to Couplet care / Skin to Skin. Placenta to Home with patient   ?Plans to Breastfeed ?Contraception: condoms ?Circumcision: wants inpatient ? ?Note sent to [facility]: FT for pp visit. ? ?Cheral Marker CNM, WHNP-BC ?06/30/2021 ?5:19 AM ? ?  ?

## 2021-03-04 ENCOUNTER — Other Ambulatory Visit: Payer: Self-pay

## 2021-03-04 ENCOUNTER — Encounter: Payer: Self-pay | Admitting: Obstetrics & Gynecology

## 2021-03-04 ENCOUNTER — Ambulatory Visit (INDEPENDENT_AMBULATORY_CARE_PROVIDER_SITE_OTHER): Payer: BC Managed Care – PPO | Admitting: Obstetrics & Gynecology

## 2021-03-04 ENCOUNTER — Ambulatory Visit (INDEPENDENT_AMBULATORY_CARE_PROVIDER_SITE_OTHER): Payer: Managed Care, Other (non HMO)

## 2021-03-04 VITALS — BP 121/70 | HR 84 | Wt 176.0 lb

## 2021-03-04 DIAGNOSIS — Z3482 Encounter for supervision of other normal pregnancy, second trimester: Secondary | ICD-10-CM

## 2021-03-04 DIAGNOSIS — Z3480 Encounter for supervision of other normal pregnancy, unspecified trimester: Secondary | ICD-10-CM

## 2021-03-04 DIAGNOSIS — Z3A21 21 weeks gestation of pregnancy: Secondary | ICD-10-CM

## 2021-03-04 DIAGNOSIS — Z363 Encounter for antenatal screening for malformations: Secondary | ICD-10-CM

## 2021-03-04 NOTE — Progress Notes (Signed)
° °  LOW-RISK PREGNANCY VISIT Patient name: Cheyenne Bauer MRN [id number]  Date of birth: [birth year] Chief Complaint:   Routine Prenatal Visit and Pregnancy Ultrasound  History of Present Illness:   Cheyenne Bauer is a 31 y.o. [id number] female at [redacted]w[redacted]d with an Estimated Date of Delivery: 07/10/21 being seen today for ongoing management of a low-risk pregnancy.  Depression screen [facility] 2/9 12/29/2020 07/20/2020 05/14/2018 04/18/2018 04/07/2017  Decreased Interest 1 1 2 3 3   Down, Depressed, Hopeless 1 1 2 3 3   PHQ - 2 Score 2 2 4 6 6   Altered sleeping 1 1 3 3 3   Tired, decreased energy 1 1 3 3 2   Change in appetite 1 1 3 3 2   Feeling bad or failure about yourself  1 1 3 3 3   Trouble concentrating 1 1 3 3  0  Moving slowly or fidgety/restless 1 1 0 0 1  Suicidal thoughts 1 1 0 0 1  PHQ-9 Score 9 9 19 21 18   Difficult doing work/chores - - - - Somewhat difficult    Today she reports no complaints. Contractions: Not present. Vag. Bleeding: None.  Movement: Present. denies leaking of fluid. Review of Systems:   Pertinent items are noted in HPI Denies abnormal vaginal discharge w/ itching/odor/irritation, headaches, visual changes, shortness of breath, chest pain, abdominal pain, severe nausea/vomiting, or problems with urination or bowel movements unless otherwise stated above. Pertinent History Reviewed:  Reviewed past medical,surgical, social, obstetrical and family history.  Reviewed problem list, medications and allergies. Physical Assessment:   Vitals:   03/04/21 1615  BP: 121/70  Pulse: 84  Weight: 176 lb (79.8 kg)  Body mass index is 28.84 kg/m.        Physical Examination:   General appearance: Well appearing, and in no distress  Mental status: Alert, oriented to person, place, and time  Skin: Warm & dry  Cardiovascular: Normal heart rate noted  Respiratory: Normal respiratory effort, no distress  Abdomen: Soft, gravid, nontender  Pelvic: Cervical exam deferred         Extremities:  Edema: None  Fetal Status:     Movement: Present    Chaperone: n/a    No results found for this or any previous visit (from the past 24 hour(s)).  Assessment & Plan:  1) Low-risk pregnancy [id number] at [redacted]w[redacted]d with an Estimated Date of Delivery: 07/10/21      Meds: No orders of the defined types were placed in this encounter.  Labs/procedures today: sonogram  Plan:  Continue routine obstetrical care  Next visit: prefers in person    Reviewed: Preterm labor symptoms and general obstetric precautions including but not limited to vaginal bleeding, contractions, leaking of fluid and fetal movement were reviewed in detail with the patient.  All questions were answered. Has home bp cuff. Rx faxed to . Check bp weekly, let us know if >140/90.   Follow-up: Return in about 4 weeks (around 04/01/2021) for [place].  No orders of the defined types were placed in this encounter.   Florian Buff, MD 03/04/2021 4:33 PM

## 2021-03-04 NOTE — Progress Notes (Signed)
[place] 21+5 wks,breech,anterior placenta gr 0,normal ovaries,cx 2.9 cm,SVP of fluid 5 cm,EFW 627 g 99%,fhr 135 BPM,anatomy complete,no obvious abnormalities

## 2021-04-01 ENCOUNTER — Ambulatory Visit (INDEPENDENT_AMBULATORY_CARE_PROVIDER_SITE_OTHER): Payer: Managed Care, Other (non HMO) | Admitting: Women's Health

## 2021-04-01 ENCOUNTER — Encounter: Payer: Self-pay | Admitting: Women's Health

## 2021-04-01 ENCOUNTER — Other Ambulatory Visit: Payer: Self-pay

## 2021-04-01 VITALS — BP 123/72 | HR 99 | Wt 182.8 lb

## 2021-04-01 DIAGNOSIS — O99012 Anemia complicating pregnancy, second trimester: Secondary | ICD-10-CM

## 2021-04-01 DIAGNOSIS — Z3A25 25 weeks gestation of pregnancy: Secondary | ICD-10-CM

## 2021-04-01 DIAGNOSIS — Z3482 Encounter for supervision of other normal pregnancy, second trimester: Secondary | ICD-10-CM

## 2021-04-01 DIAGNOSIS — Z3480 Encounter for supervision of other normal pregnancy, unspecified trimester: Secondary | ICD-10-CM

## 2021-04-01 LAB — POCT HEMOGLOBIN: Hemoglobin: 11.7 g/dL (ref 11–14.6)

## 2021-04-01 NOTE — Progress Notes (Signed)
LOW-RISK PREGNANCY VISIT Patient name: Cheyenne Bauer MRN [id number]  Date of birth: 09/12/1990 Chief Complaint:   Routine Prenatal Visit  History of Present Illness:   Cheyenne Bauer is a 31 y.o. [id number] female at [redacted]w[redacted]d with an Estimated Date of Delivery: 07/10/21 being seen today for ongoing management of a low-risk pregnancy.   Today she reports  hemorrhoids, constipation, had to stop taking iron b/c of this, so feels hgb may be low- almost felt like she was going to pass out after walking up 2 flights of stairs at work . Hasn't been able to eat well b/c so backed up from constipation. Contractions: Not present. Vag. Bleeding: None.  Movement: Present. denies leaking of fluid.  Depression screen [facility] 2/9 12/29/2020 07/20/2020 05/14/2018 04/18/2018 04/07/2017  Decreased Interest 1 1 2 3 3   Down, Depressed, Hopeless 1 1 2 3 3   PHQ - 2 Score 2 2 4 6 6   Altered sleeping 1 1 3 3 3   Tired, decreased energy 1 1 3 3 2   Change in appetite 1 1 3 3 2   Feeling bad or failure about yourself  1 1 3 3 3   Trouble concentrating 1 1 3 3  0  Moving slowly or fidgety/restless 1 1 0 0 1  Suicidal thoughts 1 1 0 0 1  PHQ-9 Score 9 9 19 21 18   Difficult doing work/chores - - - - Somewhat difficult     GAD 7 : Generalized Anxiety Score 12/29/2020 07/20/2020  Nervous, Anxious, on Edge 1 1  Control/stop worrying 1 1  Worry too much - different things 1 1  Trouble relaxing 1 1  Restless 1 1  Easily annoyed or irritable 1 1  Afraid - awful might happen 1 1  Total GAD 7 Score 7 7      Review of Systems:   Pertinent items are noted in HPI Denies abnormal vaginal discharge w/ itching/odor/irritation, headaches, visual changes, shortness of breath, chest pain, abdominal pain, severe nausea/vomiting, or problems with urination or bowel movements unless otherwise stated above. Pertinent History Reviewed:  Reviewed past medical,surgical, social, obstetrical and family history.  Reviewed problem list, medications  and allergies. Physical Assessment:   Vitals:   04/01/21 0837  BP: 123/72  Pulse: 99  Weight: 182 lb 12.8 oz (82.9 kg)  Body mass index is 29.96 kg/m.        Physical Examination:   General appearance: Well appearing, and in no distress  Mental status: Alert, oriented to person, place, and time  Skin: Warm & dry  Cardiovascular: Normal heart rate noted  Respiratory: Normal respiratory effort, no distress  Abdomen: Soft, gravid, nontender  Pelvic: Cervical exam deferred         Extremities: Edema: None  Fetal Status: Fetal Heart Rate (bpm): 153 Fundal Height: 25 cm Movement: Present    Chaperone: N/A   Results for orders placed or performed in visit on 04/01/21 (from the past 24 hour(s))  POCT hemoglobin   Collection Time: 04/01/21  9:01 AM  Result Value Ref Range   Hemoglobin 11.7 11 - 14.6 g/dL    Assessment & Plan:  1) Low-risk pregnancy [id number] at [redacted]w[redacted]d with an Estimated Date of Delivery: 07/10/21   2) H/O GHTN, ASA  3) Constipation/hemorrhoids> gave printed prevention/relief measures, use otc hemorrhoid products- let know if not helping  4) H/O anemia> recent dizziness, but hasn't been able to eat well lately d/t constipation. Hgb today 11.7   Meds: No orders of  the defined types were placed in this encounter.  Labs/procedures today: fingerstick hgb  Plan:  Continue routine obstetrical care  Next visit: prefers will be in person for pn2     Reviewed: Preterm labor symptoms and general obstetric precautions including but not limited to vaginal bleeding, contractions, leaking of fluid and fetal movement were reviewed in detail with the patient.  All questions were answered. Does have home bp cuff. Office bp cuff given: not applicable. Check bp weekly, let us know if consistently >140 and/or >90.  Follow-up: Return in about 3 weeks (around 04/22/2021) for LROB, PN2, CNM, in person.  Future Appointments  Date Time Provider Department Center  04/23/2021  8:30 AM  [facility]  04/23/2021  9:10 AM [name], CNM [facility]    Orders Placed This Encounter  Procedures   POCT hemoglobin   Cheral Marker CNM, [facility] 04/01/2021 9:05 AM

## 2021-04-01 NOTE — Patient Instructions (Addendum)
Cheyenne Bauer, thank you for choosing our office today! We appreciate the opportunity to meet your healthcare needs. You may receive a short survey by mail, e-mail, or through [employer]. If you are happy with your care we would appreciate if you could take just a few minutes to complete the survey questions. We read all of your comments and take your feedback very seriously. Thank you again for choosing our office.  Center for [employer] Team at [facility]  [facility] at [facility] ([address] [place], [place] [address]) Entrance C, located off of [address] 24/7 valet parking   Constipation Drink plenty of fluid, preferably water, throughout the day Eat foods high in fiber such as fruits, vegetables, and grains Exercise, such as walking, is a good way to keep your bowels regular Drink warm fluids, especially warm prune juice, or decaf coffee Eat a 1/2 cup of real oatmeal (not instant), 1/2 cup applesauce, and 1/2-1 cup warm prune juice every day If needed, you may take Colace (docusate sodium) stool softener once or twice a day to help keep the stool soft. If you are pregnant, wait until you are out of your first trimester (12-14 weeks of pregnancy) If you still are having problems with constipation, you may take Miralax once daily as needed to help keep your bowels regular.  If you are pregnant, wait until you are out of your first trimester (12-14 weeks of pregnancy)    You will have your sugar test next visit.  Please do not eat or drink anything after midnight the night before you come, not even water.  You will be here for at least two hours.  Please make an appointment online for the bloodwork at [email or web address] for 8:00am (or as close to this as possible). Make sure you select the [facility] service center.   CLASSES: Go to [email or web address] to register for classes (childbirth, breastfeeding, waterbirth, infant CPR, daddy bootcamp, etc.)  Call the office [phone])  or go to [facility] if: You begin to have strong, frequent contractions Your water breaks.  Sometimes it is a big gush of fluid, sometimes it is just a trickle that keeps getting your panties wet or running down your legs You have vaginal bleeding.  It is normal to have a small amount of spotting if your cervix was checked.  You don't feel your baby moving like normal.  If you don't, get you something to eat and drink and lay down and focus on feeling your baby move.   If your baby is still not moving like normal, you should call the office or go to [facility].  Call the office [phone]) or go to [facility] hospital for these signs of pre-eclampsia: Severe headache that does not go away with Tylenol Visual changes- seeing spots, double, blurred vision Pain under your right breast or upper abdomen that does not go away with Tums or heartburn medicine Nausea and/or vomiting Severe swelling in your hands, feet, and face    [facility] Pediatricians/Family Doctors [facility]): [address] Colette Ribas, [phone]           [facility]: [address], [phone]                [facility]): [address], [phone] (call to ask if accepting patients) [facility] Department: [address], [place], [phone]    [facility] Pediatricians/Family Doctors [facility]):  [address], [phone] [facility]: [address] [place], [phone] [facility]: [address], [phone]  [facility] Doctors  [facility]): [phone] [facility]: [address], [phone]   [facility] Doctors [facility]: [address] [phone]  [facility] Doctors  [facility]: [phone], [phone]  Home Blood Pressure Monitoring for  Patients   Your provider has recommended that you check your blood pressure (BP) at least once a week at home. If you do not have a blood pressure cuff at home, one will be provided for you. Contact your provider if you have not received your monitor within 1 week.   Helpful Tips for Accurate Home Blood Pressure Checks  Don't smoke, exercise, or drink caffeine 30 minutes before checking your BP Use the restroom before checking your BP (a full bladder can raise your pressure) Relax in a comfortable upright chair Feet on the ground Left arm resting comfortably on a flat surface at the level of your heart Legs uncrossed Back supported Sit quietly and don't talk Place the cuff on your bare arm Adjust snuggly, so that only two fingertips can fit between your skin and the top of the cuff Check 2 readings separated by at least one minute Keep a log of your BP readings For a visual, please reference this diagram: http://ccnc.care/bpdiagram  Provider Name: [facility]     Phone: [phone]  Zone 1: ALL CLEAR  Continue to monitor your symptoms:  BP reading is less than 140 (top number) or less than 90 (bottom number)  No right upper stomach pain No headaches or seeing spots No feeling nauseated or throwing up No swelling in face and hands  Zone 2: CAUTION Call your doctor's office for any of the following:  BP reading is greater than 140 (top number) or greater than 90 (bottom number)  Stomach pain under your ribs in the middle or right side Headaches or seeing spots Feeling nauseated or throwing up Swelling in face and hands  Zone 3: EMERGENCY  Seek immediate medical care if you have any of the following:  BP reading is greater than160 (top number) or greater than 110 (bottom number) Severe headaches not improving with Tylenol Serious difficulty catching your breath Any worsening symptoms from Zone 2   Second Trimester of Pregnancy The second trimester is from week 13  through week 28, months 4 through 6. The second trimester is often a time when you feel your best. Your body has also adjusted to being pregnant, and you begin to feel better physically. Usually, morning sickness has lessened or quit completely, you may have more energy, and you may have an increase in appetite. The second trimester is also a time when the fetus is growing rapidly. At the end of the sixth month, the fetus is about 9 inches long and weighs about 1 pounds. You will likely begin to feel the baby move (quickening) between 18 and 20 weeks of the pregnancy. BODY CHANGES Your body goes through many changes during pregnancy. The changes vary from woman to woman.  Your weight will continue to increase. You will notice your lower abdomen bulging out. You may begin to get stretch marks on your hips, abdomen, and breasts. You may develop headaches that can be relieved by medicines approved by your health care provider. You may urinate more often because the fetus is  pressing on your bladder. You may develop or continue to have heartburn as a result of your pregnancy. You may develop constipation because certain hormones are causing the muscles that push waste through your intestines to slow down. You may develop hemorrhoids or swollen, bulging veins (varicose veins). You may have back pain because of the weight gain and pregnancy hormones relaxing your joints between the bones in your pelvis and as a result of a shift in weight and the muscles that support your balance. Your breasts will continue to grow and be tender. Your gums may bleed and may be sensitive to brushing and flossing. Dark spots or blotches (chloasma, mask of pregnancy) may develop on your face. This will likely fade after the baby is born. A dark line from your belly button to the pubic area (linea nigra) may appear. This will likely fade after the baby is born. You may have changes in your hair. These can include thickening of  your hair, rapid growth, and changes in texture. Some women also have hair loss during or after pregnancy, or hair that feels dry or thin. Your hair will most likely return to normal after your baby is born. WHAT TO EXPECT AT YOUR PRENATAL VISITS During a routine prenatal visit: You will be weighed to make sure you and the fetus are growing normally. Your blood pressure will be taken. Your abdomen will be measured to track your baby's growth. The fetal heartbeat will be listened to. Any test results from the previous visit will be discussed. Your health care provider may ask you: How you are feeling. If you are feeling the baby move. If you have had any abnormal symptoms, such as leaking fluid, bleeding, severe headaches, or abdominal cramping. If you have any questions. Other tests that may be performed during your second trimester include: Blood tests that check for: Low iron levels (anemia). Gestational diabetes (between 24 and 28 weeks). Rh antibodies. Urine tests to check for infections, diabetes, or protein in the urine. An ultrasound to confirm the proper growth and development of the baby. An amniocentesis to check for possible genetic problems. Fetal screens for spina bifida and Down syndrome. HOME CARE INSTRUCTIONS  Avoid all smoking, herbs, alcohol, and unprescribed drugs. These chemicals affect the formation and growth of the baby. Follow your health care provider's instructions regarding medicine use. There are medicines that are either safe or unsafe to take during pregnancy. Exercise only as directed by your health care provider. Experiencing uterine cramps is a good sign to stop exercising. Continue to eat regular, healthy meals. Wear a good support bra for breast tenderness. Do not use hot tubs, steam rooms, or saunas. Wear your seat belt at all times when driving. Avoid raw meat, uncooked cheese, cat litter boxes, and soil used by cats. These carry germs that can cause  birth defects in the baby. Take your prenatal vitamins. Try taking a stool softener (if your health care provider approves) if you develop constipation. Eat more high-fiber foods, such as fresh vegetables or fruit and whole grains. Drink plenty of fluids to keep your urine clear or pale yellow. Take warm sitz baths to soothe any pain or discomfort caused by hemorrhoids. Use hemorrhoid cream if your health care provider approves. If you develop varicose veins, wear support hose. Elevate your feet for 15 minutes, 3-4 times a day. Limit salt in your diet. Avoid heavy lifting, wear low heel shoes, and practice good posture. Rest with your legs elevated if you have  leg cramps or low back pain. Visit your dentist if you have not gone yet during your pregnancy. Use a soft toothbrush to brush your teeth and be gentle when you floss. A sexual relationship may be continued unless your health care provider directs you otherwise. Continue to go to all your prenatal visits as directed by your health care provider. SEEK MEDICAL CARE IF:  You have dizziness. You have mild pelvic cramps, pelvic pressure, or nagging pain in the abdominal area. You have persistent nausea, vomiting, or diarrhea. You have a bad smelling vaginal discharge. You have pain with urination. SEEK IMMEDIATE MEDICAL CARE IF:  You have a fever. You are leaking fluid from your vagina. You have spotting or bleeding from your vagina. You have severe abdominal cramping or pain. You have rapid weight gain or loss. You have shortness of breath with chest pain. You notice sudden or extreme swelling of your face, hands, ankles, feet, or legs. You have not felt your baby move in over an hour. You have severe headaches that do not go away with medicine. You have vision changes. Document Released: 02/08/2001 Document Revised: 02/19/2013 Document Reviewed: 04/17/2012 [facility] Patient Information 2015 [place] [place]. This information is not  intended to replace advice given to you by your health care provider. Make sure you discuss any questions you have with your health care provider.

## 2021-04-20 ENCOUNTER — Other Ambulatory Visit: Payer: Self-pay

## 2021-04-20 ENCOUNTER — Encounter (HOSPITAL_COMMUNITY): Payer: Self-pay | Admitting: Obstetrics and Gynecology

## 2021-04-20 ENCOUNTER — Inpatient Hospital Stay (HOSPITAL_COMMUNITY)
Admission: AD | Admit: 2021-04-20 | Discharge: 2021-04-20 | Disposition: A | Discharge status: Home / Self Care | Payer: Managed Care, Other (non HMO) | Attending: Obstetrics and Gynecology | Admitting: Obstetrics and Gynecology

## 2021-04-20 ENCOUNTER — Encounter: Payer: Self-pay | Admitting: Women's Health

## 2021-04-20 ENCOUNTER — Other Ambulatory Visit: Payer: Self-pay | Admitting: Women's Health

## 2021-04-20 DIAGNOSIS — O212 Late vomiting of pregnancy: Secondary | ICD-10-CM | POA: Insufficient documentation

## 2021-04-20 DIAGNOSIS — E860 Dehydration: Secondary | ICD-10-CM | POA: Diagnosis not present

## 2021-04-20 DIAGNOSIS — R112 Nausea with vomiting, unspecified: Secondary | ICD-10-CM | POA: Diagnosis not present

## 2021-04-20 DIAGNOSIS — R197 Diarrhea, unspecified: Secondary | ICD-10-CM | POA: Diagnosis not present

## 2021-04-20 DIAGNOSIS — R1084 Generalized abdominal pain: Secondary | ICD-10-CM

## 2021-04-20 DIAGNOSIS — O26893 Other specified pregnancy related conditions, third trimester: Secondary | ICD-10-CM | POA: Diagnosis not present

## 2021-04-20 DIAGNOSIS — O99283 Endocrine, nutritional and metabolic diseases complicating pregnancy, third trimester: Secondary | ICD-10-CM | POA: Diagnosis not present

## 2021-04-20 DIAGNOSIS — Z3A28 28 weeks gestation of pregnancy: Secondary | ICD-10-CM | POA: Diagnosis not present

## 2021-04-20 LAB — COMPREHENSIVE METABOLIC PANEL
ALT: 15 U/L (ref 0–44)
AST: 15 U/L (ref 15–41)
Albumin: 3 g/dL — ABNORMAL LOW (ref 3.5–5.0)
Alkaline Phosphatase: 77 U/L (ref 38–126)
Anion gap: 13 (ref 5–15)
BUN: 8 mg/dL (ref 6–20)
CO2: 16 mmol/L — ABNORMAL LOW (ref 22–32)
Calcium: 8.7 mg/dL — ABNORMAL LOW (ref 8.9–10.3)
Chloride: 107 mmol/L (ref 98–111)
Creatinine, Ser: 0.65 mg/dL (ref 0.44–1.00)
GFR, Estimated: >60 mL/min (ref >60)
Glucose, Bld: 122 mg/dL — ABNORMAL HIGH (ref 70–99)
Potassium: 3.4 mmol/L — ABNORMAL LOW (ref 3.5–5.1)
Sodium: 136 mmol/L (ref 135–145)
Total Bilirubin: 0.5 mg/dL (ref 0.3–1.2)
Total Protein: 6.2 g/dL — ABNORMAL LOW (ref 6.5–8.1)

## 2021-04-20 LAB — URINALYSIS, ROUTINE W REFLEX MICROSCOPIC
Bilirubin Urine: NEGATIVE
Glucose, UA: NEGATIVE mg/dL
Hgb urine dipstick: NEGATIVE
Ketones, ur: 80 mg/dL — AB
Leukocytes,Ua: NEGATIVE
Nitrite: NEGATIVE
Protein, ur: 30 mg/dL — AB
Specific Gravity, Urine: 1.025 (ref 1.005–1.030)
pH: 5 (ref 5.0–8.0)

## 2021-04-20 LAB — CBC
HCT: 35.3 % — ABNORMAL LOW (ref 36.0–46.0)
Hemoglobin: 12.1 g/dL (ref 12.0–15.0)
MCH: 29.5 pg (ref 26.0–34.0)
MCHC: 34.3 g/dL (ref 30.0–36.0)
MCV: 86.1 fL (ref 80.0–100.0)
Platelets: 327 10*3/uL (ref 150–400)
RBC: 4.1 MIL/uL (ref 3.87–5.11)
RDW: 11.9 % (ref 11.5–15.5)
WBC: 10.9 10*3/uL — ABNORMAL HIGH (ref 4.0–10.5)
nRBC: 0 % (ref 0.0–0.2)

## 2021-04-20 MED ORDER — ONDANSETRON HCL 4 MG PO TABS: 4.0000 mg | ORAL_TABLET | Freq: Three times a day (TID) | ORAL | 0 refills | Status: DC | PRN

## 2021-04-20 MED ORDER — PYRIDOXINE HCL 100 MG/ML IJ SOLN
100.0000 mg | Freq: Every day | INTRAMUSCULAR | Status: DC
  Administered 2021-04-20: 100 mg via INTRAVENOUS
  Filled 2021-04-20: qty 1

## 2021-04-20 MED ORDER — ONDANSETRON HCL 4 MG/2ML IJ SOLN
4.0000 mg | Freq: Once | INTRAMUSCULAR | Status: AC
Start: 2021-04-20 — End: 2021-04-20
  Administered 2021-04-20: 4 mg via INTRAVENOUS
  Filled 2021-04-20: qty 2

## 2021-04-20 MED ORDER — HYOSCYAMINE SULFATE 0.5 MG/ML IJ SOLN
0.2500 mg | Freq: Once | INTRAMUSCULAR | Status: DC
  Filled 2021-04-20: qty 0.5

## 2021-04-20 MED ORDER — HYOSCYAMINE SULFATE 0.125 MG SL SUBL
0.1250 mg | SUBLINGUAL_TABLET | Freq: Once | SUBLINGUAL | Status: AC
  Administered 2021-04-20: 0.125 mg via SUBLINGUAL
  Filled 2021-04-20: qty 1

## 2021-04-20 MED ORDER — LACTATED RINGERS IV SOLN: Freq: Once | INTRAVENOUS | Status: AC

## 2021-04-20 MED ORDER — SODIUM CHLORIDE 0.9 % IV SOLN: Freq: Once | INTRAVENOUS | Status: AC

## 2021-04-20 MED ORDER — SODIUM CHLORIDE 0.9 % IV SOLN
12.5000 mg | Freq: Once | INTRAVENOUS | Status: AC
  Administered 2021-04-20: 12.5 mg via INTRAVENOUS
  Filled 2021-04-20: qty 12.5

## 2021-04-20 NOTE — MAU Note (Signed)
I got food poisoning since 0300 from chicken last night. No one else has been sick.  N/V/D all day. Started having ctxs at 1600. Took Zofran 4mg  ODT at 1830. Unable to keep down anything. Denies VB or LOF. Good FM
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
## 2021-04-23 ENCOUNTER — Other Ambulatory Visit: Payer: Managed Care, Other (non HMO)

## 2021-04-23 ENCOUNTER — Other Ambulatory Visit: Payer: Self-pay

## 2021-04-23 ENCOUNTER — Ambulatory Visit (INDEPENDENT_AMBULATORY_CARE_PROVIDER_SITE_OTHER): Payer: Managed Care, Other (non HMO) | Admitting: Advanced Practice Midwife

## 2021-04-23 VITALS — BP 127/72 | HR 94 | Wt 187.0 lb

## 2021-04-23 DIAGNOSIS — Z131 Encounter for screening for diabetes mellitus: Secondary | ICD-10-CM

## 2021-04-23 DIAGNOSIS — Z3A28 28 weeks gestation of pregnancy: Secondary | ICD-10-CM

## 2021-04-23 DIAGNOSIS — Z3483 Encounter for supervision of other normal pregnancy, third trimester: Secondary | ICD-10-CM

## 2021-04-23 DIAGNOSIS — Z3480 Encounter for supervision of other normal pregnancy, unspecified trimester: Secondary | ICD-10-CM

## 2021-04-23 NOTE — Patient Instructions (Signed)
Cheyenne Bauer, thank you for choosing our office today! We appreciate the opportunity to meet your healthcare needs. You may receive a short survey by mail, e-mail, or through [employer]. If you are happy with your care we would appreciate if you could take just a few minutes to complete the survey questions. We read all of your comments and take your feedback very seriously. Thank you again for choosing our office.  Center for [employer] Team at [place] at [facility] ([place], [place] [address]) Entrance C, located off of [address] parking   CLASSES: Go to [email or web address] to register for classes (childbirth, breastfeeding, waterbirth, infant CPR, daddy bootcamp, etc.)  Call the office [phone]) or go to [facility] if: You begin to have strong, frequent contractions Your water breaks.  Sometimes it is a big gush of fluid, sometimes it is just a trickle that keeps getting your panties wet or running down your legs You have vaginal bleeding.  It is normal to have a small amount of spotting if your cervix was checked.  You don't feel your baby moving like normal.  If you don't, get you something to eat and drink and lay down and focus on feeling your baby move.   If your baby is still not moving like normal, you should call the office or go to [facility].  Call the office [phone]) or go to [facility] hospital for these signs of pre-eclampsia: Severe headache that does not go away with Tylenol Visual changes- seeing spots, double, blurred vision Pain under your right breast or upper abdomen that does not go away with Tums or heartburn medicine Nausea and/or vomiting Severe swelling in your hands, feet, and face   Tdap Vaccine It is recommended that you get the Tdap vaccine during the third trimester of EACH pregnancy to help protect your baby from getting pertussis (whooping cough) 27-36 weeks is the BEST time to do this  so that you can pass the protection on to your baby. During pregnancy is better than after pregnancy, but if you are unable to get it during pregnancy it will be offered at the hospital.  You can get this vaccine with [place], at the health department, your family doctor, or some local pharmacies Everyone who will be around your baby should also be up-to-date on their vaccines before the baby comes. Adults (who are not pregnant) only need 1 dose of Tdap during adulthood.   [facility] Pediatricians/Family Doctors [facility]): [address] [place], [place] [facility]: [address] [place], [phone]                [place] [facility]): [place], [phone] (call to ask if accepting patients) [facility] Department: [address], [place], [place] Pediatricians/Family Doctors [facility]): [place] [place], [address], [place] [facility]: [address] [place], [place] [facility]: [place], [facility] [place] [facility]): [phone] [facility]: [address], [place]: [address], [place] [facility]: [phone], [phone]  Home Blood Pressure Monitoring for Patients   Your provider has recommended that you check your  blood pressure (BP) at least once a week at home. If you do not have a blood pressure cuff at home, one will be provided for you. Contact your provider if you have not received your monitor within 1 week.   Helpful Tips for Accurate Home Blood Pressure Checks  Don't smoke, exercise, or drink caffeine 30 minutes before checking your BP Use the restroom before checking your BP (a full bladder can raise your  pressure) Relax in a comfortable upright chair Feet on the ground Left arm resting comfortably on a flat surface at the level of your heart Legs uncrossed Back supported Sit quietly and don't talk Place the cuff on your bare arm Adjust snuggly, so that only two fingertips can fit between your skin and the top of the cuff Check 2 readings separated by at least one minute Keep a log of your BP readings For a visual, please reference this diagram: http://ccnc.care/bpdiagram  Provider Name: [facility]     Phone: [phone]  Zone 1: ALL CLEAR  Continue to monitor your symptoms:  BP reading is less than 140 (top number) or less than 90 (bottom number)  No right upper stomach pain No headaches or seeing spots No feeling nauseated or throwing up No swelling in face and hands  Zone 2: CAUTION Call your doctor's office for any of the following:  BP reading is greater than 140 (top number) or greater than 90 (bottom number)  Stomach pain under your ribs in the middle or right side Headaches or seeing spots Feeling nauseated or throwing up Swelling in face and hands  Zone 3: EMERGENCY  Seek immediate medical care if you have any of the following:  BP reading is greater than160 (top number) or greater than 110 (bottom number) Severe headaches not improving with Tylenol Serious difficulty catching your breath Any worsening symptoms from Zone 2   Third Trimester of Pregnancy The third trimester is from week 29 through week 42, months 7 through 9. The third trimester is a time when the fetus is growing rapidly. At the end of the ninth month, the fetus is about 20 inches in length and weighs 6-10 pounds.  BODY CHANGES Your body goes through many changes during pregnancy. The changes vary from woman to woman.  Your weight will continue to increase. You can expect to gain 25-35 pounds (11-16 kg) by the end of the pregnancy. You may begin to get stretch marks on your hips, abdomen,  and breasts. You may urinate more often because the fetus is moving lower into your pelvis and pressing on your bladder. You may develop or continue to have heartburn as a result of your pregnancy. You may develop constipation because certain hormones are causing the muscles that push waste through your intestines to slow down. You may develop hemorrhoids or swollen, bulging veins (varicose veins). You may have pelvic pain because of the weight gain and pregnancy hormones relaxing your joints between the bones in your pelvis. Backaches may result from overexertion of the muscles supporting your posture. You may have changes in your hair. These can include thickening of your hair, rapid growth, and changes in texture. Some women also have hair loss during or after pregnancy, or hair that feels dry or thin. Your hair will most likely return to normal after your baby is born. Your breasts will continue to grow and be tender. A yellow discharge may leak from your breasts called colostrum. Your belly button may stick out. You may   feel short of breath because of your expanding uterus. You may notice the fetus "dropping," or moving lower in your abdomen. You may have a bloody mucus discharge. This usually occurs a few days to a week before labor begins. Your cervix becomes thin and soft (effaced) near your due date. WHAT TO EXPECT AT YOUR PRENATAL EXAMS  You will have prenatal exams every 2 weeks until week 36. Then, you will have weekly prenatal exams. During a routine prenatal visit: You will be weighed to make sure you and the fetus are growing normally. Your blood pressure is taken. Your abdomen will be measured to track your baby's growth. The fetal heartbeat will be listened to. Any test results from the previous visit will be discussed. You may have a cervical check near your due date to see if you have effaced. At around 36 weeks, your caregiver will check your cervix. At the same time, your  caregiver will also perform a test on the secretions of the vaginal tissue. This test is to determine if a type of bacteria, Group B streptococcus, is present. Your caregiver will explain this further. Your caregiver may ask you: What your birth plan is. How you are feeling. If you are feeling the baby move. If you have had any abnormal symptoms, such as leaking fluid, bleeding, severe headaches, or abdominal cramping. If you have any questions. Other tests or screenings that may be performed during your third trimester include: Blood tests that check for low iron levels (anemia). Fetal testing to check the health, activity level, and growth of the fetus. Testing is done if you have certain medical conditions or if there are problems during the pregnancy. FALSE LABOR You may feel small, irregular contractions that eventually go away. These are called Braxton Hicks contractions, or false labor. Contractions may last for hours, days, or even weeks before true labor sets in. If contractions come at regular intervals, intensify, or become painful, it is best to be seen by your caregiver.  SIGNS OF LABOR  Menstrual-like cramps. Contractions that are 5 minutes apart or less. Contractions that start on the top of the uterus and spread down to the lower abdomen and back. A sense of increased pelvic pressure or back pain. A watery or bloody mucus discharge that comes from the vagina. If you have any of these signs before the 37th week of pregnancy, call your caregiver right away. You need to go to the hospital to get checked immediately. HOME CARE INSTRUCTIONS  Avoid all smoking, herbs, alcohol, and unprescribed drugs. These chemicals affect the formation and growth of the baby. Follow your caregiver's instructions regarding medicine use. There are medicines that are either safe or unsafe to take during pregnancy. Exercise only as directed by your caregiver. Experiencing uterine cramps is a good sign to  stop exercising. Continue to eat regular, healthy meals. Wear a good support bra for breast tenderness. Do not use hot tubs, steam rooms, or saunas. Wear your seat belt at all times when driving. Avoid raw meat, uncooked cheese, cat litter boxes, and soil used by cats. These carry germs that can cause birth defects in the baby. Take your prenatal vitamins. Try taking a stool softener (if your caregiver approves) if you develop constipation. Eat more high-fiber foods, such as fresh vegetables or fruit and whole grains. Drink plenty of fluids to keep your urine clear or pale yellow. Take warm sitz baths to soothe any pain or discomfort caused by hemorrhoids. Use hemorrhoid cream if  your caregiver approves. If you develop varicose veins, wear support hose. Elevate your feet for 15 minutes, 3-4 times a day. Limit salt in your diet. Avoid heavy lifting, wear low heal shoes, and practice good posture. Rest a lot with your legs elevated if you have leg cramps or low back pain. Visit your dentist if you have not gone during your pregnancy. Use a soft toothbrush to brush your teeth and be gentle when you floss. A sexual relationship may be continued unless your caregiver directs you otherwise. Do not travel far distances unless it is absolutely necessary and only with the approval of your caregiver. Take prenatal classes to understand, practice, and ask questions about the labor and delivery. Make a trial run to the hospital. Pack your hospital bag. Prepare the baby's nursery. Continue to go to all your prenatal visits as directed by your caregiver. SEEK MEDICAL CARE IF: You are unsure if you are in labor or if your water has broken. You have dizziness. You have mild pelvic cramps, pelvic pressure, or nagging pain in your abdominal area. You have persistent nausea, vomiting, or diarrhea. You have a bad smelling vaginal discharge. You have pain with urination. SEEK IMMEDIATE MEDICAL CARE IF:  You  have a fever. You are leaking fluid from your vagina. You have spotting or bleeding from your vagina. You have severe abdominal cramping or pain. You have rapid weight loss or gain. You have shortness of breath with chest pain. You notice sudden or extreme swelling of your face, hands, ankles, feet, or legs. You have not felt your baby move in over an hour. You have severe headaches that do not go away with medicine. You have vision changes. Document Released: 02/08/2001 Document Revised: 02/19/2013 Document Reviewed: 04/17/2012 [facility] Patient Information 2015 [place], [place]. This information is not intended to replace advice given to you by your health care provider. Make sure you discuss any questions you have with your health care provider.

## 2021-04-23 NOTE — Progress Notes (Signed)
° °  LOW-RISK PREGNANCY VISIT Patient name: Cheyenne Bauer MRN [id number]  Date of birth: Mar 20, 1990 Chief Complaint:   Routine Prenatal Visit and PN2  History of Present Illness:   Cheyenne Bauer is a 31 y.o. [id number] female at [redacted]w[redacted]d with an Estimated Date of Delivery: 07/10/21 being seen today for ongoing management of a low-risk pregnancy.  Today she reports  recovering from recent food poisoning this week- vomiting and diarrhea improving . Contractions: Irritability. Vag. Bleeding: None.  Movement: Present. denies leaking of fluid. Review of Systems:   Pertinent items are noted in HPI Denies abnormal vaginal discharge w/ itching/odor/irritation, headaches, visual changes, shortness of breath, chest pain, abdominal pain, severe nausea/vomiting, or problems with urination or bowel movements unless otherwise stated above. Pertinent History Reviewed:  Reviewed past medical,surgical, social, obstetrical and family history.  Reviewed problem list, medications and allergies. Physical Assessment:   Vitals:   04/23/21 0907  BP: 127/72  Pulse: 94  Weight: 187 lb (84.8 kg)  Body mass index is 30.65 kg/m.        Physical Examination:   General appearance: Well appearing, and in no distress  Mental status: Alert, oriented to person, place, and time  Skin: Warm & dry  Cardiovascular: Normal heart rate noted  Respiratory: Normal respiratory effort, no distress  Abdomen: Soft, gravid, nontender  Pelvic: Cervical exam deferred         Extremities: Edema: None  Fetal Status: Fetal Heart Rate (bpm): 143 Fundal Height: 28 cm Movement: Present    No results found for this or any previous visit (from the past 24 hour(s)).  Assessment & Plan:  1) Low-risk pregnancy [id number] at [redacted]w[redacted]d with an Estimated Date of Delivery: 07/10/21   2) Depression/anxiety, doing well without meds  3) Hx gHTN, taking bASA   Meds: No orders of the defined types were placed in this encounter.  Labs/procedures today: PN2  (Tdap info given- will get next visit)  Plan:  Continue routine obstetrical care   Reviewed: Preterm labor symptoms and general obstetric precautions including but not limited to vaginal bleeding, contractions, leaking of fluid and fetal movement were reviewed in detail with the patient.  All questions were answered. Has home bp cuff.  Check bp weekly, let us know if >140/90.   Follow-up: Return in about 2 weeks (around 05/07/2021) for LROB, in person.  No orders of the defined types were placed in this encounter.  [name] CNM 04/23/2021 9:25 AM

## 2021-04-24 LAB — RPR: RPR Ser Ql: NONREACTIVE

## 2021-04-24 LAB — GLUCOSE TOLERANCE, 2 HOURS W/ 1HR
Glucose, 1 hour: 121 mg/dL (ref 70–179)
Glucose, 2 hour: 108 mg/dL (ref 70–152)
Glucose, Fasting: 84 mg/dL (ref 70–91)

## 2021-04-24 LAB — CBC
Hematocrit: 32.2 % — ABNORMAL LOW (ref 34.0–46.6)
Hemoglobin: 10.7 g/dL — ABNORMAL LOW (ref 11.1–15.9)
MCH: 29.3 pg (ref 26.6–33.0)
MCHC: 33.2 g/dL (ref 31.5–35.7)
MCV: 88 fL (ref 79–97)
Platelets: 301 10*3/uL (ref 150–450)
RBC: 3.65 x10E6/uL — ABNORMAL LOW (ref 3.77–5.28)
RDW: 11.8 % (ref 11.7–15.4)
WBC: 5.4 10*3/uL (ref 3.4–10.8)

## 2021-04-24 LAB — ANTIBODY SCREEN: Antibody Screen: NEGATIVE

## 2021-04-24 LAB — HIV ANTIBODY (ROUTINE TESTING W REFLEX): HIV Screen 4th Generation wRfx: NONREACTIVE

## 2021-04-26 ENCOUNTER — Other Ambulatory Visit: Payer: Self-pay | Admitting: Women's Health

## 2021-04-26 MED ORDER — FERROUS SULFATE 325 (65 FE) MG PO TABS: 325.0000 mg | ORAL_TABLET | ORAL | 2 refills | Status: DC

## 2021-04-27 ENCOUNTER — Telehealth: Payer: Self-pay | Admitting: *Deleted

## 2021-04-27 NOTE — Telephone Encounter (Signed)
Pt aware of Kim's recommendations and voiced understanding. JSY 

## 2021-05-04 ENCOUNTER — Ambulatory Visit (INDEPENDENT_AMBULATORY_CARE_PROVIDER_SITE_OTHER): Payer: Managed Care, Other (non HMO) | Admitting: Women's Health

## 2021-05-04 ENCOUNTER — Other Ambulatory Visit: Payer: Self-pay

## 2021-05-04 VITALS — BP 131/70 | HR 92 | Wt 191.0 lb

## 2021-05-04 DIAGNOSIS — Z23 Encounter for immunization: Secondary | ICD-10-CM

## 2021-05-04 DIAGNOSIS — Z3483 Encounter for supervision of other normal pregnancy, third trimester: Secondary | ICD-10-CM

## 2021-05-04 DIAGNOSIS — Z3480 Encounter for supervision of other normal pregnancy, unspecified trimester: Secondary | ICD-10-CM

## 2021-05-04 NOTE — Progress Notes (Signed)
? ? ?LOW-RISK PREGNANCY VISIT ?Patient name: Cheyenne Bauer MRN [id number]  Date of birth: [birth year] ?Chief Complaint:   ?Routine Prenatal Visit ? ?History of Present Illness:   ?Cheyenne Bauer is a 31 y.o. [phone] female at [redacted]w[redacted]d with an Estimated Date of Delivery: 07/10/21 being seen today for ongoing management of a low-risk pregnancy.  ? ?Today she reports no complaints. Contractions: Irritability. Vag. Bleeding: None.  Movement: Present. denies leaking of fluid. ? ?Depression screen [facility] 2/9 04/23/2021 12/29/2020 07/20/2020 05/14/2018 04/18/2018  ?Decreased Interest 0 1 1 2 3   ?Down, Depressed, Hopeless 0 1 1 2 3   ?PHQ - 2 Score 0 2 2 4 6   ?Altered sleeping 1 1 1 3 3   ?Tired, decreased energy 1 1 1 3 3   ?Change in appetite 0 1 1 3 3   ?Feeling bad or failure about yourself  0 1 1 3 3   ?Trouble concentrating 1 1 1 3 3   ?Moving slowly or fidgety/restless 0 1 1 0 0  ?Suicidal thoughts 0 1 1 0 0  ?PHQ-9 Score 3 9 9 19 21   ?Difficult doing work/chores - - - - -  ? ?  ?GAD 7 : Generalized Anxiety Score 04/23/2021 12/29/2020 07/20/2020  ?Nervous, Anxious, on Edge 0 1 1  ?Control/stop worrying 0 1 1  ?Worry too much - different things 1 1 1   ?Trouble relaxing 0 1 1  ?Restless 0 1 1  ?Easily annoyed or irritable 1 1 1   ?Afraid - awful might happen 0 1 1  ?Total GAD 7 Score 2 7 7   ? ? ?  ?Review of Systems:   ?Pertinent items are noted in HPI ?Denies abnormal vaginal discharge w/ itching/odor/irritation, headaches, visual changes, shortness of breath, chest pain, abdominal pain, severe nausea/vomiting, or problems with urination or bowel movements unless otherwise stated above. ?Pertinent History Reviewed:  ?Reviewed past medical,surgical, social, obstetrical and family history.  ?Reviewed problem list, medications and allergies. ?Physical Assessment:  ? ?Vitals:  ? 05/04/21 0952  ?BP: 131/70  ?Pulse: 92  ?Weight: 191 lb (86.6 kg)  ?Body mass index is 31.3 kg/m?. ?  ?     Physical Examination:  ? General appearance: Well appearing,  and in no distress ? Mental status: Alert, oriented to person, place, and time ? Skin: Warm & dry ? Cardiovascular: Normal heart rate noted ? Respiratory: Normal respiratory effort, no distress ? Abdomen: Soft, gravid, nontender ? Pelvic: Cervical exam deferred        ? Extremities: Edema: None ? ?Fetal Status: Fetal Heart Rate (bpm): 156 Fundal Height: 30 cm Movement: Present   ? ?Chaperone: N/A   ?No results found for this or any previous visit (from the past 24 hour(s)).  ?Assessment & Plan:  ?1) Low-risk pregnancy [id number] at [redacted]w[redacted]d with an Estimated Date of Delivery: 07/10/21  ? ?2) H/O gHTN, ASA ?  ?Meds: No orders of the defined types were placed in this encounter. ? ?Labs/procedures today: tdap ? ?Plan:  Continue routine obstetrical care  ?Next visit: prefers in person   ? ?Reviewed: Preterm labor symptoms and general obstetric precautions including but not limited to vaginal bleeding, contractions, leaking of fluid and fetal movement were reviewed in detail with the patient.  All questions were answered. Does have home bp cuff. Office bp cuff given: not applicable. Check bp weekly, let us know if consistently >140 and/or >90. ? ?Follow-up: Return in about 2 weeks (around 05/18/2021) for [place], [place], in person. ? ?Future Appointments  ?Date Time  Provider Department Center  ?05/18/2021 11:10 AM Roma Schanz, CNM [facility]  ? ? ?Orders Placed This Encounter  ?Procedures  ? Tdap vaccine greater than or equal to 7yo IM  ? ?[place], WHNP-BC ?05/04/2021 ?10:47 AM  ?

## 2021-05-04 NOTE — Patient Instructions (Signed)
Alexx, thank you for choosing our office today! We appreciate the opportunity to meet your healthcare needs. You may receive a short survey by mail, e-mail, or through [employer]. If you are happy with your care we would appreciate if you could take just a few minutes to complete the survey questions. We read all of your comments and take your feedback very seriously. Thank you again for choosing our office.  Center for [employer] Team at [place] at [facility] ([place], [place] [address]) Entrance C, located off of [address] parking   CLASSES: Go to [email or web address] to register for classes (childbirth, breastfeeding, waterbirth, infant CPR, daddy bootcamp, etc.)  Call the office [phone]) or go to [facility] if: You begin to have strong, frequent contractions Your water breaks.  Sometimes it is a big gush of fluid, sometimes it is just a trickle that keeps getting your panties wet or running down your legs You have vaginal bleeding.  It is normal to have a small amount of spotting if your cervix was checked.  You don't feel your baby moving like normal.  If you don't, get you something to eat and drink and lay down and focus on feeling your baby move.   If your baby is still not moving like normal, you should call the office or go to [facility].  Call the office [phone]) or go to [facility] hospital for these signs of pre-eclampsia: Severe headache that does not go away with Tylenol Visual changes- seeing spots, double, blurred vision Pain under your right breast or upper abdomen that does not go away with Tums or heartburn medicine Nausea and/or vomiting Severe swelling in your hands, feet, and face   Tdap Vaccine It is recommended that you get the Tdap vaccine during the third trimester of EACH pregnancy to help protect your baby from getting pertussis (whooping cough) 27-36 weeks is the BEST time to do this  so that you can pass the protection on to your baby. During pregnancy is better than after pregnancy, but if you are unable to get it during pregnancy it will be offered at the hospital.  You can get this vaccine with [place], at the health department, your family doctor, or some local pharmacies Everyone who will be around your baby should also be up-to-date on their vaccines before the baby comes. Adults (who are not pregnant) only need 1 dose of Tdap during adulthood.   [facility] Pediatricians/Family Doctors [facility]): [address] [place], [place] [facility]: [address] [place], [phone]                [place] [facility]): [place], [phone] (call to ask if accepting patients) [facility] Department: [address], [place], [place] Pediatricians/Family Doctors [facility]): [place] [place], [address], [place] [facility]: [address] [place], [place] [facility]: [place], [facility] [place] [facility]): [phone] [facility]: [address], [place]: [address], [place] [facility]: [phone], [phone]  Home Blood Pressure Monitoring for Patients   Your provider has recommended that you check your  blood pressure (BP) at least once a week at home. If you do not have a blood pressure cuff at home, one will be provided for you. Contact your provider if you have not received your monitor within 1 week.  ° °Helpful Tips for Accurate Home Blood Pressure Checks  °Don't smoke, exercise, or drink caffeine 30 minutes before checking your BP °Use the restroom before checking your BP (a full bladder can raise your  pressure) °Relax in a comfortable upright chair °Feet on the ground °Left arm resting comfortably on a flat surface at the level of your heart °Legs uncrossed °Back supported °Sit quietly and don't talk °Place the cuff on your bare arm °Adjust snuggly, so that only two fingertips can fit between your skin and the top of the cuff °Check 2 readings separated by at least one minute °Keep a log of your BP readings °For a visual, please reference this diagram: http://ccnc.care/bpdiagram ° °Provider Name: [facility]     Phone: [phone] ° °Zone 1: ALL CLEAR  °Continue to monitor your symptoms:  °BP reading is less than 140 (top number) or less than 90 (bottom number)  °No right upper stomach pain °No headaches or seeing spots °No feeling nauseated or throwing up °No swelling in face and hands ° °Zone 2: CAUTION °Call your doctor's office for any of the following:  °BP reading is greater than 140 (top number) or greater than 90 (bottom number)  °Stomach pain under your ribs in the middle or right side °Headaches or seeing spots °Feeling nauseated or throwing up °Swelling in face and hands ° °Zone 3: EMERGENCY  °Seek immediate medical care if you have any of the following:  °BP reading is greater than160 (top number) or greater than 110 (bottom number) °Severe headaches not improving with Tylenol °Serious difficulty catching your breath °Any worsening symptoms from Zone 2  °Preterm Labor and Birth Information ° °The normal length of a pregnancy is 39-41 weeks. Preterm labor is when labor starts before 37 completed weeks of pregnancy. °What are the risk factors for preterm labor? °Preterm labor is more likely to occur in women who: °Have certain infections during pregnancy such as a bladder infection, sexually transmitted infection, or infection inside the uterus (chorioamnionitis). °Have a shorter-than-normal cervix. °Have gone into preterm labor before. °Have had surgery on their cervix. °Are younger than age 17  or older than age 35. °Are African American. °Are pregnant with twins or multiple babies (multiple gestation). °Take street drugs or smoke while pregnant. °Do not gain enough weight while pregnant. °Became pregnant shortly after having been pregnant. °What are the symptoms of preterm labor? °Symptoms of preterm labor include: °Cramps similar to those that can happen during a menstrual period. The cramps may happen with diarrhea. °Pain in the abdomen or lower back. °Regular uterine contractions that may feel like tightening of the abdomen. °A feeling of increased pressure in the pelvis. °Increased watery or bloody mucus discharge from the vagina. °Water breaking (ruptured amniotic sac). °Why is it important to recognize signs of preterm labor? °It is important to recognize signs of preterm labor because babies who are born prematurely may not be fully developed. This can put them at an increased risk for: °Long-term (chronic) heart and lung problems. °Difficulty immediately after birth with regulating body systems, including blood sugar, body temperature, heart rate, and breathing rate. °Bleeding in the brain. °Cerebral palsy. °Learning difficulties. °Death. °These risks are highest for babies who are born before 34 weeks   of pregnancy. How is preterm labor treated? Treatment depends on the length of your pregnancy, your condition, and the health of your baby. It may involve: Having a stitch (suture) placed in your cervix to prevent your cervix from opening too early (cerclage). Taking or being given medicines, such as: Hormone medicines. These may be given early in pregnancy to help support the pregnancy. Medicine to stop contractions. Medicines to help mature the babys lungs. These may be prescribed if the risk of delivery is high. Medicines to prevent your baby from developing cerebral palsy. If the labor happens before 34 weeks of pregnancy, you may need to stay in the hospital. What should I do if I  think I am in preterm labor? If you think that you are going into preterm labor, call your health care provider right away. How can I prevent preterm labor in future pregnancies? To increase your chance of having a full-term pregnancy: Do not use any tobacco products, such as cigarettes, chewing tobacco, and e-cigarettes. If you need help quitting, ask your health care provider. Do not use street drugs or medicines that have not been prescribed to you during your pregnancy. Talk with your health care provider before taking any herbal supplements, even if you have been taking them regularly. Make sure you gain a healthy amount of weight during your pregnancy. Watch for infection. If you think that you might have an infection, get it checked right away. Make sure to tell your health care provider if you have gone into preterm labor before. This information is not intended to replace advice given to you by your health care provider. Make sure you discuss any questions you have with your health care provider. Document Revised: 06/08/2018 Document Reviewed: 07/08/2015 Elsevier Patient Education  [place].

## 2021-05-18 ENCOUNTER — Other Ambulatory Visit: Payer: Self-pay

## 2021-05-18 ENCOUNTER — Ambulatory Visit (INDEPENDENT_AMBULATORY_CARE_PROVIDER_SITE_OTHER): Payer: Managed Care, Other (non HMO) | Admitting: Women's Health

## 2021-05-18 ENCOUNTER — Encounter: Payer: Self-pay | Admitting: Women's Health

## 2021-05-18 VITALS — BP 132/81 | HR 89 | Wt 198.0 lb

## 2021-05-18 DIAGNOSIS — Z3483 Encounter for supervision of other normal pregnancy, third trimester: Secondary | ICD-10-CM

## 2021-05-18 DIAGNOSIS — Z3480 Encounter for supervision of other normal pregnancy, unspecified trimester: Secondary | ICD-10-CM

## 2021-05-18 NOTE — Progress Notes (Signed)
? ? ?LOW-RISK PREGNANCY VISIT ?Patient name: Cheyenne Bauer MRN [id number]  Date of birth: 1990/07/06 ?Chief Complaint:   ?Routine Prenatal Visit ? ?History of Present Illness:   ?Cheyenne Bauer is a 31 y.o. [phone] female at [redacted]w[redacted]d with an Estimated Date of Delivery: 07/10/21 being seen today for ongoing management of a low-risk pregnancy.  ? ?Today she reports  sciatica, FIL had brain mass removed yesterday, waiting on pathology . Contractions: Not present. Vag. Bleeding: None.  Movement: Present. denies leaking of fluid. ? ?Depression screen [facility] 2/9 04/23/2021 12/29/2020 07/20/2020 05/14/2018 04/18/2018  ?Decreased Interest 0 1 1 2 3   ?Down, Depressed, Hopeless 0 1 1 2 3   ?PHQ - 2 Score 0 2 2 4 6   ?Altered sleeping 1 1 1 3 3   ?Tired, decreased energy 1 1 1 3 3   ?Change in appetite 0 1 1 3 3   ?Feeling bad or failure about yourself  0 1 1 3 3   ?Trouble concentrating 1 1 1 3 3   ?Moving slowly or fidgety/restless 0 1 1 0 0  ?Suicidal thoughts 0 1 1 0 0  ?PHQ-9 Score 3 9 9 19 21   ?Difficult doing work/chores - - - - -  ? ?  ?GAD 7 : Generalized Anxiety Score 04/23/2021 12/29/2020 07/20/2020  ?Nervous, Anxious, on Edge 0 1 1  ?Control/stop worrying 0 1 1  ?Worry too much - different things 1 1 1   ?Trouble relaxing 0 1 1  ?Restless 0 1 1  ?Easily annoyed or irritable 1 1 1   ?Afraid - awful might happen 0 1 1  ?Total GAD 7 Score 2 7 7   ? ? ?  ?Review of Systems:   ?Pertinent items are noted in HPI ?Denies abnormal vaginal discharge w/ itching/odor/irritation, headaches, visual changes, shortness of breath, chest pain, abdominal pain, severe nausea/vomiting, or problems with urination or bowel movements unless otherwise stated above. ?Pertinent History Reviewed:  ?Reviewed past medical,surgical, social, obstetrical and family history.  ?Reviewed problem list, medications and allergies. ?Physical Assessment:  ? ?Vitals:  ? 05/18/21 1129  ?BP: 132/81  ?Pulse: 89  ?Weight: 198 lb (89.8 kg)  ?Body mass index is 32.45 kg/m?. ?  ?      Physical Examination:  ? General appearance: Well appearing, and in no distress ? Mental status: Alert, oriented to person, place, and time ? Skin: Warm & dry ? Cardiovascular: Normal heart rate noted ? Respiratory: Normal respiratory effort, no distress ? Abdomen: Soft, gravid, nontender ? Pelvic: Cervical exam deferred        ? Extremities: Edema: Trace ? ?Fetal Status: Fetal Heart Rate (bpm): 145 Fundal Height: 32 cm Movement: Present   ? ?Chaperone: N/A   ?No results found for this or any previous visit (from the past 24 hour(s)).  ?Assessment & Plan:  ?1) Low-risk pregnancy [id number] at [redacted]w[redacted]d with an Estimated Date of Delivery: 07/10/21  ? ?2) Sciatica, gave printed exercises ? ?3) H/O gHTN> ASA, check home bp's, let know if >140/90 or sx ?  ?Meds: No orders of the defined types were placed in this encounter. ? ?Labs/procedures today: none ? ?Plan:  Continue routine obstetrical care  ?Next visit: prefers in person   ? ?Reviewed: Preterm labor symptoms and general obstetric precautions including but not limited to vaginal bleeding, contractions, leaking of fluid and fetal movement were reviewed in detail with the patient.  All questions were answered. Does have home bp cuff. Office bp cuff given: not applicable. Check bp weekly, let  us know if consistently >140 and/or >90. ? ?Follow-up: Return in about 2 weeks (around 06/01/2021) for LROB, CNM, in person. ? ?No future appointments. ? ?No orders of the defined types were placed in this encounter. ? ?Cheral Marker CNM, WHNP-BC ?05/18/2021 ?11:42 AM  ?

## 2021-05-18 NOTE — Patient Instructions (Addendum)
Cheyenne Bauer, thank you for choosing our office today! We appreciate the opportunity to meet your healthcare needs. You may receive a short survey by mail, e-mail, or through MyChart. If you are happy with your care we would appreciate if you could take just a few minutes to complete the survey questions. We read all of your comments and take your feedback very seriously. Thank you again for choosing our office.  ?[facility] ? ?[facility] [place] ?([address] [place], [place] [address]) ?Entrance C, located off of [address] ?Free 24/7 valet parking  ? ?CLASSES: Go to [email or web address] to register for classes (childbirth, breastfeeding, waterbirth, infant CPR, daddy bootcamp, etc.) ? ?Call the office ([phone]) or go to [facility] if: ?You begin to have strong, frequent contractions ?Your water breaks.  Sometimes it is a big gush of fluid, sometimes it is just a trickle that keeps getting your panties wet or running down your legs ?You have vaginal bleeding.  It is normal to have a small amount of spotting if your cervix was checked.  ?You don't feel your baby moving like normal.  If you don't, get you something to eat and drink and lay down and focus on feeling your baby move.   If your baby is still not moving like normal, you should call the office or go to [facility]. ? ?Call the office ([phone]) or [facility] to [facility] for these signs of pre-eclampsia: ?Severe headache that does not go away with Tylenol ?Visual changes- seeing spots, double, blurred vision ?Pain under your right breast or upper abdomen that does not go away with Tums or heartburn medicine ?Nausea and/or vomiting ?Severe swelling in your hands, feet, and face  ? ?Tdap Vaccine ?It is recommended that you get the Tdap vaccine during the third trimester of EACH pregnancy to help protect your baby from getting pertussis (whooping cough) ?27-36 weeks is the BEST time to do this  so that you can pass the protection on to your baby. During pregnancy is better than after pregnancy, but if you are unable to get it during pregnancy it will be offered at the hospital.  ?You can get this vaccine with us, at the health department, your family doctor, or some local pharmacies ?Everyone who will be around your baby should also be up-to-date on their vaccines before the baby comes. Adults (who are not pregnant) only need 1 dose of Tdap during adulthood.  ? ?[place] Pediatricians/Family Doctors ?[facility] ([facility]): [address], [phone]           ?[facility]: [address], [phone]                ?[facility] ([facility]): [address], [phone] (call to ask if accepting patients) ?[facility]: [address], [place], [phone]   ? ?[place] Pediatricians/Family Doctors ?[facility] ([facility]): [address], [phone] ?[facility]: [address], [phone] ?[facility]: [address], [phone] ? ?[place] Family Doctors  ?[facility] ([facility]): [phone] ?[facility]: [address], [phone]  ? ?[place] Family Doctors ?[facility]: [address], [phone] ? ?[place] Family Doctors  ?[facility]: [address], [phone] ? ?Home Blood Pressure Monitoring for Patients  ? ?Your provider has recommended that you check your   blood pressure (BP) at least once a week at home. If you do not have a blood pressure cuff at home, one will be provided for you. Contact your provider if you have not received your monitor within 1 week.  ? ?Helpful Tips for Accurate Home Blood Pressure Checks  ?Don't smoke, exercise, or drink caffeine 30 minutes before checking your BP ?Use the restroom before checking your BP (a full bladder can raise your  pressure) ?Relax in a comfortable upright chair ?Feet on the ground ?Left arm resting comfortably on a flat surface at the level of your heart ?Legs uncrossed ?Back supported ?Sit quietly and don't talk ?Place the cuff on your bare arm ?Adjust snuggly, so that only two fingertips can fit between your skin and the top of the cuff ?Check 2 readings separated by at least one minute ?Keep a log of your BP readings ?For a visual, please reference this diagram: http://ccnc.care/bpdiagram ? ?Provider Name: [facility]     Phone: [phone] ? ?Zone 1: ALL CLEAR  ?Continue to monitor your symptoms:  ?BP reading is less than 140 (top number) or less than 90 (bottom number)  ?No right upper stomach pain ?No headaches or seeing spots ?No feeling nauseated or throwing up ?No swelling in face and hands ? ?Zone 2: CAUTION ?Call your doctor's office for any of the following:  ?BP reading is greater than 140 (top number) or greater than 90 (bottom number)  ?Stomach pain under your ribs in the middle or right side ?Headaches or seeing spots ?Feeling nauseated or throwing up ?Swelling in face and hands ? ?Zone 3: EMERGENCY  ?Seek immediate medical care if you have any of the following:  ?BP reading is greater than160 (top number) or greater than 110 (bottom number) ?Severe headaches not improving with Tylenol ?Serious difficulty catching your breath ?Any worsening symptoms from Zone 2  ?Preterm Labor and Birth Information ? ?The normal length of a pregnancy is 39-41 weeks. Preterm labor is when labor starts before 37 completed weeks of pregnancy. ?What are the risk factors for preterm labor? ?Preterm labor is more likely to occur in women who: ?Have certain infections during pregnancy such as a bladder infection, sexually transmitted infection, or infection inside the uterus (chorioamnionitis). ?Have a shorter-than-normal cervix. ?Have gone into preterm labor before. ?Have had surgery on their cervix. ?Are younger than age 72  or older than age 108. ?Are African American. ?Are pregnant with twins or multiple babies (multiple gestation). ?Take street drugs or smoke while pregnant. ?Do not gain enough weight while pregnant. ?Became pregnant shortly after having been pregnant. ?What are the symptoms of preterm labor? ?Symptoms of preterm labor include: ?Cramps similar to those that can happen during a menstrual period. The cramps may happen with diarrhea. ?Pain in the abdomen or lower back. ?Regular uterine contractions that may feel like tightening of the abdomen. ?A feeling of increased pressure in the pelvis. ?Increased watery or bloody mucus discharge from the vagina. ?Water breaking (ruptured amniotic sac). ?Why is it important to recognize signs of preterm labor? ?It is important to recognize signs of preterm labor because babies who are born prematurely may not be fully developed. This can put them at an increased risk for: ?Long-term (chronic) heart and lung problems. ?Difficulty immediately after birth with regulating body systems, including blood sugar, body temperature, heart rate, and breathing rate. ?Bleeding in the brain. ?Cerebral palsy. ?Learning difficulties. ?Death. ?These risks are highest for babies who are born before 11 weeks  of pregnancy. ?How is preterm labor treated? ?Treatment depends on the length of your pregnancy, your condition, and the health of your baby. It may involve: ?Having a stitch (suture) placed in your cervix to prevent your cervix from opening too early (cerclage). ?Taking or being given medicines, such as: ?Hormone medicines. These may be given early in pregnancy to help support the pregnancy. ?Medicine to stop contractions. ?Medicines to help mature the baby?s lungs. These may be prescribed if the risk of delivery is high. ?Medicines to prevent your baby from developing cerebral palsy. ?If the labor happens before 34 weeks of pregnancy, you may need to stay in the hospital. ?What should I do if I  think I am in preterm labor? ?If you think that you are going into preterm labor, call your health care provider right away. ?How can I prevent preterm labor in future pregnancies? ?To increase your chance of

## 2021-06-01 ENCOUNTER — Ambulatory Visit (INDEPENDENT_AMBULATORY_CARE_PROVIDER_SITE_OTHER): Payer: Managed Care, Other (non HMO) | Admitting: Women's Health

## 2021-06-01 ENCOUNTER — Encounter: Payer: Self-pay | Admitting: Women's Health

## 2021-06-01 VITALS — BP 132/76 | HR 87 | Wt 201.0 lb

## 2021-06-01 DIAGNOSIS — Z3480 Encounter for supervision of other normal pregnancy, unspecified trimester: Secondary | ICD-10-CM

## 2021-06-01 DIAGNOSIS — R030 Elevated blood-pressure reading, without diagnosis of hypertension: Secondary | ICD-10-CM

## 2021-06-01 DIAGNOSIS — Z3483 Encounter for supervision of other normal pregnancy, third trimester: Secondary | ICD-10-CM

## 2021-06-01 LAB — POCT URINALYSIS DIPSTICK OB
Blood, UA: NEGATIVE
Glucose, UA: NEGATIVE
Ketones, UA: NEGATIVE
Leukocytes, UA: NEGATIVE
Nitrite, UA: NEGATIVE
POC,PROTEIN,UA: NEGATIVE

## 2021-06-01 NOTE — Patient Instructions (Signed)
Cheyenne Bauer, thank you for choosing our office today! We appreciate the opportunity to meet your healthcare needs. You may receive a short survey by mail, e-mail, or through [employer]. If you are happy with your care we would appreciate if you could take just a few minutes to complete the survey questions. We read all of your comments and take your feedback very seriously. Thank you again for choosing our office.  ?[facility] [employer] Team at [facility] ? ?[facility] [place] [facility] ?([address] [place], [place] [address]) ?Entrance C, located off of [address] ?Free 24/7 valet parking  ? ?CLASSES: Go to [email or web address] to register for classes (childbirth, breastfeeding, waterbirth, infant CPR, daddy bootcamp, etc.) ? ?Call the office [phone]) or go to [facility] if: ?You begin to have strong, frequent contractions ?Your water breaks.  Sometimes it is a big gush of fluid, sometimes it is just a trickle that keeps getting your panties wet or running down your legs ?You have vaginal bleeding.  It is normal to have a small amount of spotting if your cervix was checked.  ?You don't feel your baby moving like normal.  If you don't, get you something to eat and drink and lay down and focus on feeling your baby move.   If your baby is still not moving like normal, you should call the office or go to [facility]. ? ?Call the office [phone]) or go to [facility] hospital for these signs of pre-eclampsia: ?Severe headache that does not go away with Tylenol ?Visual changes- seeing spots, double, blurred vision ?Pain under your right breast or upper abdomen that does not go away with Tums or heartburn medicine ?Nausea and/or vomiting ?Severe swelling in your hands, feet, and face  ? ?Tdap Vaccine ?It is recommended that you get the Tdap vaccine during the third trimester of EACH pregnancy to help protect your baby from getting pertussis (whooping cough) ?27-36 weeks is the BEST time to do this  so that you can pass the protection on to your baby. During pregnancy is better than after pregnancy, but if you are unable to get it during pregnancy it will be offered at the hospital.  ?You can get this vaccine with [place], at the health department, your family doctor, or some local pharmacies ?Everyone who will be around your baby should also be up-to-date on their vaccines before the baby comes. Adults (who are not pregnant) only need 1 dose of Tdap during adulthood.  ? ?[place] Pediatricians/Family Doctors ?[facility]): [address] [place], [phone]           ?[facility]: [address], [phone]                ?[facility]): [place], [phone] (call to ask if accepting patients) ?[facility]: [address], [place], [place]   ? ?[place] Pediatricians/Family Doctors ?[facility]): [address], [phone] ?[place]: [address] [place], [phone] ?[facility]: [place], [phone] ? ?Register  ?[facility]): [phone] ?[facility]: [place], [phone]  ? ?[place] ?Newborn: [place] [address], [phone] ? ?[place]  ?[facility]: [phone], [phone] ? ?Home Blood Pressure Monitoring for Patients  ? ?Your provider has recommended that you check your  blood pressure (BP) at least once a week at home. If you do not have a blood pressure cuff at home, one will be provided for you. Contact your provider if you have not received your monitor within 1 week.  ? ?Helpful Tips for Accurate Home Blood Pressure Checks  ?Don't smoke, exercise, or drink caffeine 30 minutes before checking your BP ?Use the restroom before checking your BP (a full bladder can raise your  pressure) ?Relax in a comfortable upright chair ?Feet on the ground ?Left arm resting comfortably on a flat surface at the level of your heart ?Legs uncrossed ?Back supported ?Sit quietly and don't talk ?Place the cuff on your bare arm ?Adjust snuggly, so that only two fingertips can fit between your skin and the top of the cuff ?Check 2 readings separated by at least one minute ?Keep a log of your BP readings ?For a visual, please reference this diagram: http://ccnc.care/bpdiagram ? ?Provider Name: [facility]     Phone: [phone] ? ?Zone 1: ALL CLEAR  ?Continue to monitor your symptoms:  ?BP reading is less than 140 (top number) or less than 90 (bottom number)  ?No right upper stomach pain ?No headaches or seeing spots ?No feeling nauseated or throwing up ?No swelling in face and hands ? ?Zone 2: CAUTION ?Call your doctor's office for any of the following:  ?BP reading is greater than 140 (top number) or greater than 90 (bottom number)  ?Stomach pain under your ribs in the middle or right side ?Headaches or seeing spots ?Feeling nauseated or throwing up ?Swelling in face and hands ? ?Zone 3: EMERGENCY  ?Seek immediate medical care if you have any of the following:  ?BP reading is greater than160 (top number) or greater than 110 (bottom number) ?Severe headaches not improving with Tylenol ?Serious difficulty catching your breath ?Any worsening symptoms from Zone 2  ?Preterm Labor and Birth Information ? ?The normal length of a pregnancy is 39-41 weeks. Preterm labor is when labor starts before 37 completed weeks of pregnancy. ?What are the risk factors for preterm labor? ?Preterm labor is more likely to occur in women who: ?Have certain infections during pregnancy such as a bladder infection, sexually transmitted infection, or infection inside the uterus (chorioamnionitis). ?Have a shorter-than-normal cervix. ?Have gone into preterm labor before. ?Have had surgery on their cervix. ?Are younger than age 72  or older than age 108. ?Are African American. ?Are pregnant with twins or multiple babies (multiple gestation). ?Take street drugs or smoke while pregnant. ?Do not gain enough weight while pregnant. ?Became pregnant shortly after having been pregnant. ?What are the symptoms of preterm labor? ?Symptoms of preterm labor include: ?Cramps similar to those that can happen during a menstrual period. The cramps may happen with diarrhea. ?Pain in the abdomen or lower back. ?Regular uterine contractions that may feel like tightening of the abdomen. ?A feeling of increased pressure in the pelvis. ?Increased watery or bloody mucus discharge from the vagina. ?Water breaking (ruptured amniotic sac). ?Why is it important to recognize signs of preterm labor? ?It is important to recognize signs of preterm labor because babies who are born prematurely may not be fully developed. This can put them at an increased risk for: ?Long-term (chronic) heart and lung problems. ?Difficulty immediately after birth with regulating body systems, including blood sugar, body temperature, heart rate, and breathing rate. ?Bleeding in the brain. ?Cerebral palsy. ?Learning difficulties. ?Death. ?These risks are highest for babies who are born before 11 weeks  of pregnancy. ?How is preterm labor treated? ?Treatment depends on the length of your pregnancy, your condition, and the health of your baby. It may involve: ?Having a stitch (suture) placed in your cervix to prevent your cervix from opening too early (cerclage). ?Taking or being given medicines, such as: ?Hormone medicines. These may be given early in pregnancy to help support the pregnancy. ?Medicine to stop contractions. ?Medicines to help mature the baby?s lungs. These may be prescribed if the risk of delivery is high. ?Medicines to prevent your baby from developing cerebral palsy. ?If the labor happens before 34 weeks of pregnancy, you may need to stay in the hospital. ?What should I do if I  think I am in preterm labor? ?If you think that you are going into preterm labor, call your health care provider right away. ?How can I prevent preterm labor in future pregnancies? ?To increase your chance of

## 2021-06-01 NOTE — Progress Notes (Signed)
? ? ?LOW-RISK PREGNANCY VISIT ?Patient name: Cheyenne Bauer MRN [id number]  Date of birth: Sep 20, 1990 ?Chief Complaint:   ?Routine Prenatal Visit ? ?History of Present Illness:   ?Cheyenne Bauer is a 31 y.o. [phone] female at [redacted]w[redacted]d with an Estimated Date of Delivery: 07/10/21 being seen today for ongoing management of a low-risk pregnancy.  ? ?Today she reports home bp's have been normal, checking about every other night. FIL dx w/ stage 3-4 brain cancer. Family has moved up the wedding that was supposed to take place in May to next weekend. Has just been under a lot of stress. Denies ha, visual changes, ruq/epigastric pain, n/v.   Contractions: Not present. Vag. Bleeding: None.  Movement: Present. denies leaking of fluid. ? ? ?  04/23/2021  ?  9:10 AM 12/29/2020  ?  3:19 PM 07/20/2020  ? 11:39 AM 05/14/2018  ?  4:08 PM 04/18/2018  ?  9:48 AM  ?Depression screen PHQ 2/9  ?Decreased Interest 0 1 1 2 3   ?Down, Depressed, Hopeless 0 1 1 2 3   ?PHQ - 2 Score 0 2 2 4 6   ?Altered sleeping 1 1 1 3 3   ?Tired, decreased energy 1 1 1 3 3   ?Change in appetite 0 1 1 3 3   ?Feeling bad or failure about yourself  0 1 1 3 3   ?Trouble concentrating 1 1 1 3 3   ?Moving slowly or fidgety/restless 0 1 1 0 0  ?Suicidal thoughts 0 1 1 0 0  ?PHQ-9 Score 3 9 9 19 21   ? ?  ? ?  04/23/2021  ?  9:11 AM 12/29/2020  ?  3:19 PM 07/20/2020  ? 11:39 AM  ?GAD 7 : Generalized Anxiety Score  ?Nervous, Anxious, on Edge 0 1 1  ?Control/stop worrying 0 1 1  ?Worry too much - different things 1 1 1   ?Trouble relaxing 0 1 1  ?Restless 0 1 1  ?Easily annoyed or irritable 1 1 1   ?Afraid - awful might happen 0 1 1  ?Total GAD 7 Score 2 7 7   ? ? ?  ?Review of Systems:   ?Pertinent items are noted in HPI ?Denies abnormal vaginal discharge w/ itching/odor/irritation, headaches, visual changes, shortness of breath, chest pain, abdominal pain, severe nausea/vomiting, or problems with urination or bowel movements unless otherwise stated above. ?Pertinent History Reviewed:   ?Reviewed past medical,surgical, social, obstetrical and family history.  ?Reviewed problem list, medications and allergies. ?Physical Assessment:  ? ?Vitals:  ? 06/01/21 1054 06/01/21 1058  ?BP: (!) 142/76 132/76  ?Pulse: 95 87  ?Weight: 201 lb (91.2 kg)   ?Body mass index is 32.94 kg/m?. ?  ?     Physical Examination:  ? General appearance: Well appearing, and in no distress ? Mental status: Alert, oriented to person, place, and time ? Skin: Warm & dry ? Cardiovascular: Normal heart rate noted ? Respiratory: Normal respiratory effort, no distress ? Abdomen: Soft, gravid, nontender ? Pelvic: Cervical exam deferred        ? Extremities: Edema: Trace ? ?Fetal Status: Fetal Heart Rate (bpm): 131 Fundal Height: 33 cm Movement: Present   ? ?Urine dipstick: neg ? ?Chaperone: N/A   ?Results for orders placed or performed in visit on 06/01/21 (from the past 24 hour(s))  ?POC Urinalysis Dipstick OB  ? Collection Time: 06/01/21 11:40 AM  ?Result Value Ref Range  ? Color, UA    ? Clarity, UA    ? Glucose, UA Negative Negative  ?  Bilirubin, UA    ? Ketones, UA neg   ? Spec Grav, UA    ? Blood, UA neg   ? pH, UA    ? POC,PROTEIN,UA Negative Negative, Trace, Small (1+), Moderate (2+), Large (3+), 4+  ? Urobilinogen, UA    ? Nitrite, UA neg   ? Leukocytes, UA Negative Negative  ? Appearance    ? Odor    ?  ?Assessment & Plan:  ?1) Low-risk pregnancy [id number] at [redacted]w[redacted]d with an Estimated Date of Delivery: 07/10/21  ? ?2) Initially elevated bp w/ h/o GHTN, normal on repeat, asymptomatic, no proteinuria, under a lot of stress. To check daily at home, if >140/90 or sx, let us know. F/u 1wk ?  ?Meds: No orders of the defined types were placed in this encounter. ? ?Labs/procedures today: none ? ?Plan:  Continue routine obstetrical care  ?Next visit: prefers in person   ? ?Reviewed: Preterm labor symptoms and general obstetric precautions including but not limited to vaginal bleeding, contractions, leaking of fluid and fetal movement were  reviewed in detail with the patient.  All questions were answered. Does have home bp cuff. Office bp cuff given: not applicable. Check bp daily, let us know if consistently >140 and/or >90. ? ?Follow-up: Return in about 1 week (around 06/08/2021) for LROB, CNM, in person; then weekly. ? ?Future Appointments  ?Date Time Provider Department Center  ?06/08/2021  9:50 AM Despina Hidden, Amaryllis Dyke, MD [facility]  ?06/15/2021 10:30 AM [name], CNM [facility]  ?06/22/2021  9:10 AM Lazaro Arms, MD [facility]  ?06/29/2021 10:30 AM Cheral Marker, CNM [facility]  ?07/06/2021 10:10 AM Cheral Marker, CNM [facility]  ? ? ?Orders Placed This Encounter  ?Procedures  ? POC Urinalysis Dipstick OB  ? ?Cheral Marker CNM, WHNP-BC ?06/01/2021 ?11:50 AM  ?

## 2021-06-08 ENCOUNTER — Ambulatory Visit (INDEPENDENT_AMBULATORY_CARE_PROVIDER_SITE_OTHER): Payer: Managed Care, Other (non HMO) | Admitting: Obstetrics & Gynecology

## 2021-06-08 ENCOUNTER — Encounter: Payer: Self-pay | Admitting: Obstetrics & Gynecology

## 2021-06-08 VITALS — BP 127/74 | Wt 203.0 lb

## 2021-06-08 DIAGNOSIS — Z3480 Encounter for supervision of other normal pregnancy, unspecified trimester: Secondary | ICD-10-CM

## 2021-06-08 NOTE — Progress Notes (Signed)
? ?  LOW-RISK PREGNANCY VISIT ?Patient name: Cheyenne Bauer MRN [id number]  Date of birth: 1991-02-06 ?Chief Complaint:   ?Routine Prenatal Visit ? ?History of Present Illness:   ?Cheyenne Bauer is a 31 y.o. [phone] female at [redacted]w[redacted]d with an Estimated Date of Delivery: 07/10/21 being seen today for ongoing management of a low-risk pregnancy.  ? ?  04/23/2021  ?  9:10 AM 12/29/2020  ?  3:19 PM 07/20/2020  ? 11:39 AM 05/14/2018  ?  4:08 PM 04/18/2018  ?  9:48 AM  ?Depression screen PHQ 2/9  ?Decreased Interest 0 1 1 2 3   ?Down, Depressed, Hopeless 0 1 1 2 3   ?PHQ - 2 Score 0 2 2 4 6   ?Altered sleeping 1 1 1 3 3   ?Tired, decreased energy 1 1 1 3 3   ?Change in appetite 0 1 1 3 3   ?Feeling bad or failure about yourself  0 1 1 3 3   ?Trouble concentrating 1 1 1 3 3   ?Moving slowly or fidgety/restless 0 1 1 0 0  ?Suicidal thoughts 0 1 1 0 0  ?PHQ-9 Score 3 9 9 19 21   ? ? ?Today she reports no complaints. Contractions: Not present. Vag. Bleeding: None.  Movement: Present. denies leaking of fluid. ?Review of Systems:   ?Pertinent items are noted in HPI ?Denies abnormal vaginal discharge w/ itching/odor/irritation, headaches, visual changes, shortness of breath, chest pain, abdominal pain, severe nausea/vomiting, or problems with urination or bowel movements unless otherwise stated above. ?Pertinent History Reviewed:  ?Reviewed past medical,surgical, social, obstetrical and family history.  ?Reviewed problem list, medications and allergies. ?Physical Assessment:  ? ?Vitals:  ? 06/08/21 0956  ?BP: 127/74  ?Weight: 203 lb (92.1 kg)  ?Body mass index is 33.27 kg/m?. ?  ?     Physical Examination:  ? General appearance: Well appearing, and in no distress ? Mental status: Alert, oriented to person, place, and time ? Skin: Warm & dry ? Cardiovascular: Normal heart rate noted ? Respiratory: Normal respiratory effort, no distress ? Abdomen: Soft, gravid, nontender ? Pelvic: Cervical exam deferred        ? Extremities: Edema: Trace ? ?Fetal Status:      Movement: Present   ? ?Chaperone: n/a   ? ?No results found for this or any previous visit (from the past 24 hour(s)).  ?Assessment & Plan:  ?1) Low-risk pregnancy at [redacted]w[redacted]d with an Estimated Date of Delivery: 07/10/21  ? ?2)  ?  ICD-10-CM   ?1. Supervision of other normal pregnancy, antepartum  Z34.80   ? BP normalized this week, family related stressors ?  ?  ? ? ?  ?Meds: No orders of the defined types were placed in this encounter. ? ?Labs/procedures today:  ? ?Plan:  Continue routine obstetrical care  ?Next visit: prefers in person   ? ?Reviewed: Preterm labor symptoms and general obstetric precautions including but not limited to vaginal bleeding, contractions, leaking of fluid and fetal movement were reviewed in detail with the patient.  All questions were answered. has home bp cuff. Rx faxed to . Check bp weekly, let know if >140/90.  ? ?Follow-up: Return in about 10 days (around 06/18/2021) for LROB. ? ?No orders of the defined types were placed in this encounter. ? ? ? , MD ?06/08/2021 ?10:32 AM ? ?

## 2021-06-15 ENCOUNTER — Other Ambulatory Visit (HOSPITAL_COMMUNITY)
Admission: RE | Admit: 2021-06-15 | Discharge: 2021-06-15 | Disposition: A | Discharge status: Home / Self Care | Payer: Managed Care, Other (non HMO) | Source: Ambulatory Visit | Attending: Advanced Practice Midwife | Admitting: Advanced Practice Midwife

## 2021-06-15 ENCOUNTER — Ambulatory Visit (INDEPENDENT_AMBULATORY_CARE_PROVIDER_SITE_OTHER): Payer: Managed Care, Other (non HMO) | Admitting: Advanced Practice Midwife

## 2021-06-15 VITALS — BP 132/80 | HR 96 | Wt 210.0 lb

## 2021-06-15 DIAGNOSIS — R030 Elevated blood-pressure reading, without diagnosis of hypertension: Secondary | ICD-10-CM | POA: Diagnosis not present

## 2021-06-15 DIAGNOSIS — Z3A36 36 weeks gestation of pregnancy: Secondary | ICD-10-CM | POA: Diagnosis present

## 2021-06-15 DIAGNOSIS — O99891 Other specified diseases and conditions complicating pregnancy: Secondary | ICD-10-CM | POA: Diagnosis present

## 2021-06-15 LAB — POCT URINALYSIS DIPSTICK OB
Blood, UA: NEGATIVE
Glucose, UA: NEGATIVE
Ketones, UA: NEGATIVE
Leukocytes, UA: NEGATIVE
Nitrite, UA: NEGATIVE
POC,PROTEIN,UA: NEGATIVE

## 2021-06-15 NOTE — Progress Notes (Signed)
? ?  LOW-RISK PREGNANCY VISIT ?Patient name: Cheyenne Bauer MRN [id number]  Date of birth: 23-Feb-1991 ?Chief Complaint:   ?Routine Prenatal Visit and Cultures ? ?History of Present Illness:   ?Cheyenne Bauer is a 31 y.o. [phone] female at [redacted]w[redacted]d with an Estimated Date of Delivery: 07/10/21 being seen today for ongoing management of a low-risk pregnancy.  ?Today she reports  having a lot of stress currently; denies s/s pre-e; states home BPs are <140/90 . Contractions: Irritability. Vag. Bleeding: None.  Movement: Present. denies leaking of fluid. ?Review of Systems:   ?Pertinent items are noted in HPI ?Denies abnormal vaginal discharge w/ itching/odor/irritation, headaches, visual changes, shortness of breath, chest pain, abdominal pain, severe nausea/vomiting, or problems with urination or bowel movements unless otherwise stated above. ?Pertinent History Reviewed:  ?Reviewed past medical,surgical, social, obstetrical and family history.  ?Reviewed problem list, medications and allergies. ?Physical Assessment:  ? ?Vitals:  ? 06/15/21 1048 06/15/21 1052  ?BP: 140/82 132/80  ?Pulse: 93 96  ?Weight: 210 lb (95.3 kg)   ?Body mass index is 34.41 kg/m?. ?  ?     Physical Examination:  ? General appearance: Well appearing, and in no distress ? Mental status: Alert, oriented to person, place, and time ? Skin: Warm & dry ? Cardiovascular: Normal heart rate noted ? Respiratory: Normal respiratory effort, no distress ? Abdomen: Soft, gravid, nontender ? Pelvic: Cervical exam deferred        ? Extremities: Edema: Trace ? ?Fetal Status: Fetal Heart Rate (bpm): 151 Fundal Height: 36 cm Movement: Present Presentation: Vertex ? ?No results found for this or any previous visit (from the past 24 hour(s)).  ?Assessment & Plan:  ?1) Low-risk pregnancy [id number] at [redacted]w[redacted]d with an Estimated Date of Delivery: 07/10/21  ? ?2) Elevated BP 140/82, recheck 132/80; last elevation was at 34wks; asymptomatic for pre-e; has a lot of family concerns going on  currently, but has been taking BP at home multiple times each day and all values <140/90; will get labs today with repeat BP 4/20 or call sooner w pre-e signs ?  ?Meds: No orders of the defined types were placed in this encounter. ? ?Labs/procedures today: GBS/cultures; pre-e labs ? ?Plan:  Continue routine obstetrical care  ? ?Reviewed: Preterm labor symptoms and general obstetric precautions including but not limited to vaginal bleeding, contractions, leaking of fluid and fetal movement were reviewed in detail with the patient.  All questions were answered. Has home bp cuff.  Check bp weekly, let us know if >140/90.  ? ?Follow-up: Return for RN BP check 4/20. ? ?Orders Placed This Encounter  ?Procedures  ? Strep Gp B NAA  ? Comp Met (CMET)  ? CBC  ? Protein / creatinine ratio, urine  ? ?Myrtis Ser CNM ?06/15/2021 ?11:20 AM  ?

## 2021-06-15 NOTE — Addendum Note (Signed)
Addended byLeilani Able, Sereniti Wan A on: 06/15/2021 11:55 AM ? ? Modules accepted: Orders ? ?

## 2021-06-16 LAB — CBC
Hematocrit: 33.8 % — ABNORMAL LOW (ref 34.0–46.6)
Hemoglobin: 11.1 g/dL (ref 11.1–15.9)
MCH: 27.3 pg (ref 26.6–33.0)
MCHC: 32.8 g/dL (ref 31.5–35.7)
MCV: 83 fL (ref 79–97)
Platelets: 278 10*3/uL (ref 150–450)
RBC: 4.06 x10E6/uL (ref 3.77–5.28)
RDW: 13.3 % (ref 11.7–15.4)
WBC: 5.9 10*3/uL (ref 3.4–10.8)

## 2021-06-16 LAB — COMPREHENSIVE METABOLIC PANEL
ALT: 16 IU/L (ref 0–32)
AST: 20 IU/L (ref 0–40)
Albumin/Globulin Ratio: 1.6 (ref 1.2–2.2)
Albumin: 3.4 g/dL — ABNORMAL LOW (ref 3.9–5.0)
Alkaline Phosphatase: 114 IU/L (ref 44–121)
BUN/Creatinine Ratio: 17 (ref 9–23)
BUN: 10 mg/dL (ref 6–20)
Bilirubin Total: <0.2 mg/dL (ref 0.0–1.2)
CO2: 18 mmol/L — ABNORMAL LOW (ref 20–29)
Calcium: 9.3 mg/dL (ref 8.7–10.2)
Chloride: 108 mmol/L — ABNORMAL HIGH (ref 96–106)
Creatinine, Ser: 0.59 mg/dL (ref 0.57–1.00)
Globulin, Total: 2.1 g/dL (ref 1.5–4.5)
Glucose: 67 mg/dL — ABNORMAL LOW (ref 70–99)
Potassium: 4.2 mmol/L (ref 3.5–5.2)
Sodium: 140 mmol/L (ref 134–144)
Total Protein: 5.5 g/dL — ABNORMAL LOW (ref 6.0–8.5)
eGFR: 125 mL/min/{1.73_m2} (ref >59)

## 2021-06-16 LAB — CERVICOVAGINAL ANCILLARY ONLY
Chlamydia: NEGATIVE
Comment: NEGATIVE
Comment: NORMAL
Neisseria Gonorrhea: NEGATIVE

## 2021-06-16 LAB — PROTEIN / CREATININE RATIO, URINE
Creatinine, Urine: 114.8 mg/dL
Protein, Ur: 11.5 mg/dL
Protein/Creat Ratio: 100 mg/g creat (ref 0–200)

## 2021-06-17 ENCOUNTER — Ambulatory Visit (INDEPENDENT_AMBULATORY_CARE_PROVIDER_SITE_OTHER): Payer: Managed Care, Other (non HMO) | Admitting: *Deleted

## 2021-06-17 DIAGNOSIS — Z0130 Encounter for examination of blood pressure without abnormal findings: Secondary | ICD-10-CM

## 2021-06-17 LAB — STREP GP B NAA: Strep Gp B NAA: NEGATIVE

## 2021-06-17 NOTE — Progress Notes (Signed)
? ?  NURSE VISIT- BLOOD PRESSURE CHECK ? ?SUBJECTIVE:  ?Cheyenne Bauer is a 31 y.o. [phone] female here for BP check. She is [redacted]w[redacted]d pregnant   ? ?HYPERTENSION ROS: ? ?Pregnant/postpartum:  ?Severe headaches that don't go away with tylenol/other medicines: No  ?Visual changes (seeing spots/double/blurred vision) No  ?Severe pain under right breast breast or in center of upper chest No  ?Severe nausea/vomiting No  ?Taking medicines as instructed not applicable ? ? ? ?OBJECTIVE:  ?BP 125/79   Pulse 89   LMP 10/03/2020 (Exact Date)   ?Appearance alert, well appearing, and in no distress. ? ?ASSESSMENT: ?Pregnancy [redacted]w[redacted]d  blood pressure check ? ?PLAN: ?Discussed with Dr. Charlotta Newton   ?Recommendations: no changes needed   ?Follow-up: as scheduled  ? ?Annamarie Dawley  ?06/17/2021 ?10:10 AM  ?

## 2021-06-21 ENCOUNTER — Encounter: Payer: Managed Care, Other (non HMO) | Admitting: Women's Health

## 2021-06-21 ENCOUNTER — Ambulatory Visit (INDEPENDENT_AMBULATORY_CARE_PROVIDER_SITE_OTHER): Payer: Managed Care, Other (non HMO) | Admitting: Obstetrics & Gynecology

## 2021-06-21 VITALS — BP 128/87 | HR 90 | Wt 208.0 lb

## 2021-06-21 DIAGNOSIS — Z3480 Encounter for supervision of other normal pregnancy, unspecified trimester: Secondary | ICD-10-CM

## 2021-06-21 DIAGNOSIS — Z3A37 37 weeks gestation of pregnancy: Secondary | ICD-10-CM

## 2021-06-21 NOTE — Progress Notes (Signed)
? ?  LOW-RISK PREGNANCY VISIT ?Patient name: Cheyenne Bauer MRN [id number]  Date of birth: Mar 14, 1990 ?Chief Complaint:   ?Routine Prenatal Visit ? ?History of Present Illness:   ?Cheyenne Bauer is a 31 y.o. [phone] female at [redacted]w[redacted]d with an Estimated Date of Delivery: 07/10/21 being seen today for ongoing management of a low-risk pregnancy.  ? ?  04/23/2021  ?  9:10 AM 12/29/2020  ?  3:19 PM 07/20/2020  ? 11:39 AM 05/14/2018  ?  4:08 PM 04/18/2018  ?  9:48 AM  ?Depression screen PHQ 2/9  ?Decreased Interest 0 1 1 2 3   ?Down, Depressed, Hopeless 0 1 1 2 3   ?PHQ - 2 Score 0 2 2 4 6   ?Altered sleeping 1 1 1 3 3   ?Tired, decreased energy 1 1 1 3 3   ?Change in appetite 0 1 1 3 3   ?Feeling bad or failure about yourself  0 1 1 3 3   ?Trouble concentrating 1 1 1 3 3   ?Moving slowly or fidgety/restless 0 1 1 0 0  ?Suicidal thoughts 0 1 1 0 0  ?PHQ-9 Score 3 9 9 19 21   ? ? ?Today she reports no complaints. Contractions: Irritability. Vag. Bleeding: None.  Movement: Present. denies leaking of fluid. ?Review of Systems:   ?Pertinent items are noted in HPI ?Denies abnormal vaginal discharge w/ itching/odor/irritation, headaches, visual changes, shortness of breath, chest pain, abdominal pain, severe nausea/vomiting, or problems with urination or bowel movements unless otherwise stated above. ?Pertinent History Reviewed:  ?Reviewed past medical,surgical, social, obstetrical and family history.  ?Reviewed problem list, medications and allergies. ?Physical Assessment:  ? ?Vitals:  ? 06/21/21 1111  ?BP: 128/87  ?Pulse: 90  ?Weight: 208 lb (94.3 kg)  ?Body mass index is 34.09 kg/m?. ?  ?     Physical Examination:  ? General appearance: Well appearing, and in no distress ? Mental status: Alert, oriented to person, place, and time ? Skin: Warm & dry ? Cardiovascular: Normal heart rate noted ? Respiratory: Normal respiratory effort, no distress ? Abdomen: Soft, gravid, nontender ? Pelvic: Cervical exam deferred        ? Extremities: Edema:  Trace ? ?Fetal Status: Fetal Heart Rate (bpm): 130 Fundal Height: 37 cm Movement: Present   ? ?Chaperone: n/a   ? ?No results found for this or any previous visit (from the past 24 hour(s)).  ?Assessment & Plan:  ?1) Low-risk pregnancy at [redacted]w[redacted]d with an Estimated Date of Delivery: 07/10/21  ? ? ?  ?Meds: No orders of the defined types were placed in this encounter. ? ?Labs/procedures today:  ? ?Plan:  Continue routine obstetrical care  ?Next visit: prefers in person   ? ?Reviewed: Term labor symptoms and general obstetric precautions including but not limited to vaginal bleeding, contractions, leaking of fluid and fetal movement were reviewed in detail with the patient.  All questions were answered. Has home bp cuff. Rx faxed to . Check bp weekly, let know if >140/90.  ? ?Follow-up: Return for keep scheduled. ? ?No orders of the defined types were placed in this encounter. ? ? ? , MD ?06/21/2021 ?11:39 AM ? ?

## 2021-06-22 ENCOUNTER — Encounter: Payer: Managed Care, Other (non HMO) | Admitting: Obstetrics & Gynecology

## 2021-06-29 ENCOUNTER — Inpatient Hospital Stay (HOSPITAL_COMMUNITY)
Admission: AD | Admit: 2021-06-29 | Discharge: 2021-07-01 | DRG: 807 | Disposition: A | Discharge status: Home / Self Care | Payer: Managed Care, Other (non HMO) | Attending: Obstetrics and Gynecology | Admitting: Obstetrics and Gynecology

## 2021-06-29 ENCOUNTER — Other Ambulatory Visit: Payer: Self-pay

## 2021-06-29 ENCOUNTER — Encounter (HOSPITAL_COMMUNITY): Payer: Self-pay | Admitting: Obstetrics and Gynecology

## 2021-06-29 ENCOUNTER — Encounter: Payer: Self-pay | Admitting: Women's Health

## 2021-06-29 ENCOUNTER — Ambulatory Visit (INDEPENDENT_AMBULATORY_CARE_PROVIDER_SITE_OTHER): Payer: Managed Care, Other (non HMO) | Admitting: Women's Health

## 2021-06-29 VITALS — BP 152/82 | HR 102 | Wt 208.0 lb

## 2021-06-29 DIAGNOSIS — O139 Gestational [pregnancy-induced] hypertension without significant proteinuria, unspecified trimester: Secondary | ICD-10-CM | POA: Diagnosis present

## 2021-06-29 DIAGNOSIS — Z7982 Long term (current) use of aspirin: Secondary | ICD-10-CM | POA: Diagnosis not present

## 2021-06-29 DIAGNOSIS — O0993 Supervision of high risk pregnancy, unspecified, third trimester: Secondary | ICD-10-CM | POA: Diagnosis not present

## 2021-06-29 DIAGNOSIS — O133 Gestational [pregnancy-induced] hypertension without significant proteinuria, third trimester: Principal | ICD-10-CM

## 2021-06-29 DIAGNOSIS — Z3A38 38 weeks gestation of pregnancy: Secondary | ICD-10-CM

## 2021-06-29 DIAGNOSIS — Z87891 Personal history of nicotine dependence: Secondary | ICD-10-CM

## 2021-06-29 DIAGNOSIS — O134 Gestational [pregnancy-induced] hypertension without significant proteinuria, complicating childbirth: Principal | ICD-10-CM | POA: Diagnosis present

## 2021-06-29 DIAGNOSIS — O09899 Supervision of other high risk pregnancies, unspecified trimester: Secondary | ICD-10-CM

## 2021-06-29 DIAGNOSIS — F418 Other specified anxiety disorders: Secondary | ICD-10-CM | POA: Diagnosis present

## 2021-06-29 DIAGNOSIS — Z2839 Other underimmunization status: Secondary | ICD-10-CM

## 2021-06-29 LAB — COMPREHENSIVE METABOLIC PANEL
ALT: 16 U/L (ref 0–44)
AST: 21 U/L (ref 15–41)
Albumin: 2.8 g/dL — ABNORMAL LOW (ref 3.5–5.0)
Alkaline Phosphatase: 115 U/L (ref 38–126)
Anion gap: 9 (ref 5–15)
BUN: 13 mg/dL (ref 6–20)
CO2: 21 mmol/L — ABNORMAL LOW (ref 22–32)
Calcium: 8.9 mg/dL (ref 8.9–10.3)
Chloride: 109 mmol/L (ref 98–111)
Creatinine, Ser: 0.81 mg/dL (ref 0.44–1.00)
GFR, Estimated: >60 mL/min (ref >60)
Glucose, Bld: 89 mg/dL (ref 70–99)
Potassium: 3.9 mmol/L (ref 3.5–5.1)
Sodium: 139 mmol/L (ref 135–145)
Total Bilirubin: 0.3 mg/dL (ref 0.3–1.2)
Total Protein: 5.8 g/dL — ABNORMAL LOW (ref 6.5–8.1)

## 2021-06-29 LAB — POCT URINALYSIS DIPSTICK OB
Blood, UA: NEGATIVE
Glucose, UA: NEGATIVE
Ketones, UA: NEGATIVE
Leukocytes, UA: NEGATIVE
Nitrite, UA: NEGATIVE
POC,PROTEIN,UA: NEGATIVE

## 2021-06-29 LAB — TYPE AND SCREEN
ABO/RH(D): A POS
Antibody Screen: NEGATIVE

## 2021-06-29 LAB — PROTEIN / CREATININE RATIO, URINE
Creatinine, Urine: 175.14 mg/dL
Protein Creatinine Ratio: 0.08 mg/mg{Cre} (ref 0.00–0.15)
Total Protein, Urine: 14 mg/dL

## 2021-06-29 LAB — CBC
HCT: 35.1 % — ABNORMAL LOW (ref 36.0–46.0)
Hemoglobin: 11.3 g/dL — ABNORMAL LOW (ref 12.0–15.0)
MCH: 26.7 pg (ref 26.0–34.0)
MCHC: 32.2 g/dL (ref 30.0–36.0)
MCV: 82.8 fL (ref 80.0–100.0)
Platelets: 313 10*3/uL (ref 150–400)
RBC: 4.24 MIL/uL (ref 3.87–5.11)
RDW: 14.5 % (ref 11.5–15.5)
WBC: 8.1 10*3/uL (ref 4.0–10.5)
nRBC: 0 % (ref 0.0–0.2)

## 2021-06-29 MED ORDER — DIPHENHYDRAMINE HCL 50 MG/ML IJ SOLN: 12.5000 mg | INTRAMUSCULAR | Status: DC | PRN

## 2021-06-29 MED ORDER — SOD CITRATE-CITRIC ACID 500-334 MG/5ML PO SOLN: 30.0000 mL | ORAL | Status: DC | PRN

## 2021-06-29 MED ORDER — OXYTOCIN-SODIUM CHLORIDE 30-0.9 UT/500ML-% IV SOLN
2.5000 [IU]/h | INTRAVENOUS | Status: DC
  Administered 2021-06-30: 2.5 [IU]/h via INTRAVENOUS

## 2021-06-29 MED ORDER — HYDROXYZINE HCL 50 MG PO TABS: 50.0000 mg | ORAL_TABLET | Freq: Four times a day (QID) | ORAL | Status: DC | PRN

## 2021-06-29 MED ORDER — LIDOCAINE HCL (PF) 1 % IJ SOLN: 30.0000 mL | INTRAMUSCULAR | Status: DC | PRN

## 2021-06-29 MED ORDER — TERBUTALINE SULFATE 1 MG/ML IJ SOLN: 0.2500 mg | Freq: Once | INTRAMUSCULAR | Status: DC | PRN

## 2021-06-29 MED ORDER — ONDANSETRON HCL 4 MG/2ML IJ SOLN
4.0000 mg | Freq: Four times a day (QID) | INTRAMUSCULAR | Status: DC | PRN
Start: 2021-06-29 — End: 2021-06-30
  Administered 2021-06-30: 4 mg via INTRAVENOUS
  Filled 2021-06-29: qty 2

## 2021-06-29 MED ORDER — HYDRALAZINE HCL 20 MG/ML IJ SOLN: 10.0000 mg | INTRAMUSCULAR | Status: DC | PRN

## 2021-06-29 MED ORDER — EPHEDRINE 5 MG/ML INJ: 10.0000 mg | INTRAVENOUS | Status: DC | PRN

## 2021-06-29 MED ORDER — LACTATED RINGERS IV SOLN: INTRAVENOUS | Status: DC

## 2021-06-29 MED ORDER — PHENYLEPHRINE 80 MCG/ML (10ML) SYRINGE FOR IV PUSH (FOR BLOOD PRESSURE SUPPORT): 80.0000 ug | PREFILLED_SYRINGE | INTRAVENOUS | Status: DC | PRN

## 2021-06-29 MED ORDER — LACTATED RINGERS IV SOLN
500.0000 mL | Freq: Once | INTRAVENOUS | Status: AC
  Administered 2021-06-30: 500 mL via INTRAVENOUS

## 2021-06-29 MED ORDER — LABETALOL HCL 5 MG/ML IV SOLN: 80.0000 mg | INTRAVENOUS | Status: DC | PRN

## 2021-06-29 MED ORDER — OXYTOCIN BOLUS FROM INFUSION
333.0000 mL | Freq: Once | INTRAVENOUS | Status: AC
  Administered 2021-06-30: 333 mL via INTRAVENOUS

## 2021-06-29 MED ORDER — LACTATED RINGERS IV SOLN
500.0000 mL | INTRAVENOUS | Status: DC | PRN
  Administered 2021-06-29: 1000 mL via INTRAVENOUS

## 2021-06-29 MED ORDER — LABETALOL HCL 5 MG/ML IV SOLN: 20.0000 mg | INTRAVENOUS | Status: DC | PRN

## 2021-06-29 MED ORDER — OXYCODONE-ACETAMINOPHEN 5-325 MG PO TABS: 2.0000 | ORAL_TABLET | ORAL | Status: DC | PRN

## 2021-06-29 MED ORDER — OXYCODONE-ACETAMINOPHEN 5-325 MG PO TABS: 1.0000 | ORAL_TABLET | ORAL | Status: DC | PRN

## 2021-06-29 MED ORDER — LABETALOL HCL 5 MG/ML IV SOLN: 40.0000 mg | INTRAVENOUS | Status: DC | PRN

## 2021-06-29 MED ORDER — FENTANYL CITRATE (PF) 100 MCG/2ML IJ SOLN: 100.0000 ug | INTRAMUSCULAR | Status: DC | PRN

## 2021-06-29 MED ORDER — OXYTOCIN-SODIUM CHLORIDE 30-0.9 UT/500ML-% IV SOLN
1.0000 m[IU]/min | INTRAVENOUS | Status: DC
  Administered 2021-06-29: 2 m[IU]/min via INTRAVENOUS
  Filled 2021-06-29: qty 500

## 2021-06-29 MED ORDER — FLEET ENEMA 7-19 GM/118ML RE ENEM: 1.0000 | ENEMA | RECTAL | Status: DC | PRN

## 2021-06-29 MED ORDER — FENTANYL-BUPIVACAINE-NACL 0.5-0.125-0.9 MG/250ML-% EP SOLN
12.0000 mL/h | EPIDURAL | Status: DC | PRN
  Administered 2021-06-30: 12 mL/h via EPIDURAL
  Filled 2021-06-29: qty 250

## 2021-06-29 MED ORDER — CALCIUM CARBONATE ANTACID 500 MG PO CHEW
1.0000 | CHEWABLE_TABLET | Freq: Three times a day (TID) | ORAL | Status: DC | PRN
  Administered 2021-06-29: 200 mg via ORAL
  Filled 2021-06-29: qty 1

## 2021-06-29 MED ORDER — ACETAMINOPHEN 325 MG PO TABS: 650.0000 mg | ORAL_TABLET | ORAL | Status: DC | PRN

## 2021-06-29 NOTE — Progress Notes (Signed)
Patient ID: Cheyenne Bauer, female   DOB: 11/10/1990, 31 y.o.   MRN: [id number] ?Cheyenne Bauer is a 31 y.o. [phone] at [redacted]w[redacted]d admitted for induction of labor due to gestational hypertension  ? ?Subjective: ?uncomfortable w/ contractions and denies headache, visual changes, ruq/epigastric pain, n/v, not quite ready for epidural ? ?Objective: ?BP 117/68   Pulse 82   Temp 98.4 ?F (36.9 ?C) (Oral)   Resp 16   LMP 10/03/2020 (Exact Date)   SpO2 100%  ?No intake/output data recorded. ? ?FHR baseline 130 bpm, Variability: moderate, Accelerations:present, Decelerations:  Absent ?Toco: q 2-77mins  ? ?SVE:   Dilation: 4 ?Effacement (%): 80 ?Station: -2 ?Exam by:: Knute Neu CNM ? ?Pitocin @ 8 mu/min ? ?Labs: ?Lab Results  ?Component Value Date  ? WBC 8.1 06/29/2021  ? HGB 11.3 (L) 06/29/2021  ? HCT 35.1 (L) 06/29/2021  ? MCV 82.8 06/29/2021  ? PLT 313 06/29/2021  ? ? ?Assessment / Plan: ?IOL d/t GHTN, progressing on pitocin, consider AROM next check ? ?Labor: early ?Fetal Wellbeing:  Category I ?Pain Control:  labor support without medications ?Pre-eclampsia: asymptomatic, bp's stable, and labs stable ?I/D:  GBS neg ?Anticipated MOD: NSVB ? ?[place], WHNP-BC ?06/29/2021, 10:12 PM  ?
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
## 2021-06-29 NOTE — Progress Notes (Signed)
Patient was called for direct admit patient stated she could not arrive until after 5pm. Patient will have to go to MAU for evaluation until she can get up to L&D.  ?

## 2021-06-29 NOTE — MAU Note (Signed)
Cheyenne Bauer is a 31 y.o. at [redacted]w[redacted]d here in MAU reporting: sent over from the office for direct admit for HTN. Denies PIH s/s. Having irregular contractions, no bleeding or LOF. +FM ? ?Onset of complaint: today ? ?Pain score: 0/10 ? ?Vitals:  ? 06/29/21 1803  ?BP: (!) 142/83  ?Pulse: 85  ?Resp: 16  ?Temp: 98.3 ?F (36.8 ?C)  ?SpO2: 99%  ?   ?FHT:+FM, EFM applied in room ? ?Lab orders placed from triage: none ? ?

## 2021-06-29 NOTE — Progress Notes (Signed)
? ?HIGH-RISK PREGNANCY VISIT ?Patient name: Cheyenne Bauer MRN [id number]  Date of birth: 1990/11/07 ?Chief Complaint:   ?Routine Prenatal Visit ? ?History of Present Illness:   ?Cheyenne Bauer is a 31 y.o. [phone] female at [redacted]w[redacted]d with an Estimated Date of Delivery: 07/10/21 being seen today for ongoing management of a high-risk pregnancy complicated by gestational hypertension currently on no meds, dx today.   ? ?Today she reports Rt sciatica. Denies ha, visual changes, ruq/epigastric pain, n/v. Contractions: Irritability. Vag. Bleeding: None.  Movement: Present. denies leaking of fluid.  ? ? ?  04/23/2021  ?  9:10 AM 12/29/2020  ?  3:19 PM 07/20/2020  ? 11:39 AM 05/14/2018  ?  4:08 PM 04/18/2018  ?  9:48 AM  ?Depression screen PHQ 2/9  ?Decreased Interest 0 1 1 2 3   ?Down, Depressed, Hopeless 0 1 1 2 3   ?PHQ - 2 Score 0 2 2 4 6   ?Altered sleeping 1 1 1 3 3   ?Tired, decreased energy 1 1 1 3 3   ?Change in appetite 0 1 1 3 3   ?Feeling bad or failure about yourself  0 1 1 3 3   ?Trouble concentrating 1 1 1 3 3   ?Moving slowly or fidgety/restless 0 1 1 0 0  ?Suicidal thoughts 0 1 1 0 0  ?PHQ-9 Score 3 9 9 19 21   ? ?  ? ?  04/23/2021  ?  9:11 AM 12/29/2020  ?  3:19 PM 07/20/2020  ? 11:39 AM  ?GAD 7 : Generalized Anxiety Score  ?Nervous, Anxious, on Edge 0 1 1  ?Control/stop worrying 0 1 1  ?Worry too much - different things 1 1 1   ?Trouble relaxing 0 1 1  ?Restless 0 1 1  ?Easily annoyed or irritable 1 1 1   ?Afraid - awful might happen 0 1 1  ?Total GAD 7 Score 2 7 7   ? ? ? ?Review of Systems:   ?Pertinent items are noted in HPI ?Denies abnormal vaginal discharge w/ itching/odor/irritation, headaches, visual changes, shortness of breath, chest pain, abdominal pain, severe nausea/vomiting, or problems with urination or bowel movements unless otherwise stated above. ?Pertinent History Reviewed:  ?Reviewed past medical,surgical, social, obstetrical and family history.  ?Reviewed problem list, medications and allergies. ?Physical  Assessment:  ? ?Vitals:  ? 06/29/21 1031 06/29/21 1034  ?BP: (!) 146/86 (!) 152/82  ?Pulse: 100 (!) 102  ?Weight:  208 lb (94.3 kg)  ?Body mass index is 34.09 kg/m?. ?     ?     Physical Examination:  ? General appearance: alert, well appearing, and in no distress ? Mental status: alert, oriented to person, place, and time ? Skin: warm & dry  ? Extremities: Edema: Trace  ?  Cardiovascular: normal heart rate noted ? Respiratory: normal respiratory effort, no distress ? Abdomen: gravid, soft, non-tender ? Pelvic: Cervical exam performed  Dilation: 3 Effacement (%): 50 Station: -2 ? ?Fetal Status:     Movement: Present Presentation: Vertex ? ?Fetal Surveillance Testing today: NST: FHR baseline 135 bpm, Variability: moderate, Accelerations:present, Decelerations:  Absent= Cat 1/reactive ?Toco: irregular   ? ?Chaperone: Celene Squibb   ? ?Results for orders placed or performed in visit on 06/29/21 (from the past 24 hour(s))  ?POC Urinalysis Dipstick OB  ? Collection Time: 06/29/21 10:40 AM  ?Result Value Ref Range  ? Color, UA    ? Clarity, UA    ? Glucose, UA Negative Negative  ? Bilirubin, UA    ? Ketones,  UA neg   ? Spec Grav, UA    ? Blood, UA neg   ? pH, UA    ? POC,PROTEIN,UA Negative Negative, Trace, Small (1+), Moderate (2+), Large (3+), 4+  ? Urobilinogen, UA    ? Nitrite, UA neg   ? Leukocytes, UA Negative Negative  ? Appearance    ? Odor    ?  ?Assessment & Plan:  ?High-risk pregnancy: [id number] at [redacted]w[redacted]d with an Estimated Date of Delivery: 07/10/21  ? ?1) GHTN, dx today, to [facility] for IOL, notified S Higinio Plan, D Simpson, Dr. Nehemiah Settle L&D full, will need to hold in MAU-notified MAU staff N Nugent, J Emly ? ?Meds: No orders of the defined types were placed in this encounter. ? ?Labs/procedures today: SVE and NST ? ?Treatment Plan:  to [facility] for IOL ? ?Follow-up: Return for will schedule bp check after delivery. ? ? ?No future appointments. ? ?Orders Placed This Encounter  ?Procedures  ? POC Urinalysis Dipstick OB   ? ?[place], [place] ?06/29/2021 ?11:28 AM  ?

## 2021-06-29 NOTE — Plan of Care (Signed)
  Problem: Education: Goal: Knowledge of General Education information will improve Description: Including pain rating scale, medication(s)/side effects and non-pharmacologic comfort measures Outcome: Progressing   Problem: Health Behavior/Discharge Planning: Goal: Ability to manage health-related needs will improve Outcome: Progressing   Problem: Clinical Measurements: Goal: Ability to maintain clinical measurements within normal limits will improve Outcome: Progressing Goal: Will remain free from infection Outcome: Progressing Goal: Diagnostic test results will improve Outcome: Progressing Goal: Respiratory complications will improve Outcome: Progressing Goal: Cardiovascular complication will be avoided Outcome: Progressing   Problem: Activity: Goal: Risk for activity intolerance will decrease Outcome: Progressing   Problem: Nutrition: Goal: Adequate nutrition will be maintained Outcome: Progressing   Problem: Coping: Goal: Level of anxiety will decrease Outcome: Progressing   Problem: Elimination: Goal: Will not experience complications related to bowel motility Outcome: Progressing Goal: Will not experience complications related to urinary retention Outcome: Progressing   Problem: Pain Managment: Goal: General experience of comfort will improve Outcome: Progressing   Problem: Safety: Goal: Ability to remain free from injury will improve Outcome: Progressing   Problem: Skin Integrity: Goal: Risk for impaired skin integrity will decrease Outcome: Progressing   Problem: Education: Goal: Knowledge of Childbirth will improve Outcome: Progressing Goal: Ability to make informed decisions regarding treatment and plan of care will improve Outcome: Progressing Goal: Ability to state and carry out methods to decrease the pain will improve Outcome: Progressing Goal: Individualized Educational Video(s) Outcome: Progressing   Problem: Coping: Goal: Ability to  verbalize concerns and feelings about labor and delivery will improve Outcome: Progressing   Problem: Life Cycle: Goal: Ability to make normal progression through stages of labor will improve Outcome: Progressing Goal: Ability to effectively push during vaginal delivery will improve Outcome: Progressing   Problem: Role Relationship: Goal: Will demonstrate positive interactions with the child Outcome: Progressing   Problem: Safety: Goal: Risk of complications during labor and delivery will decrease Outcome: Progressing   Problem: Pain Management: Goal: Relief or control of pain from uterine contractions will improve Outcome: Progressing   Problem: Education: Goal: Knowledge of disease or condition will improve Outcome: Progressing Goal: Knowledge of the prescribed therapeutic regimen will improve Outcome: Progressing   Problem: Fluid Volume: Goal: Peripheral tissue perfusion will improve Outcome: Progressing   Problem: Clinical Measurements: Goal: Complications related to disease process, condition or treatment will be avoided or minimized Outcome: Progressing   

## 2021-06-30 ENCOUNTER — Encounter (HOSPITAL_COMMUNITY): Payer: Self-pay | Admitting: Obstetrics and Gynecology

## 2021-06-30 ENCOUNTER — Inpatient Hospital Stay (HOSPITAL_COMMUNITY): Payer: Managed Care, Other (non HMO) | Admitting: Anesthesiology

## 2021-06-30 DIAGNOSIS — O134 Gestational [pregnancy-induced] hypertension without significant proteinuria, complicating childbirth: Secondary | ICD-10-CM

## 2021-06-30 DIAGNOSIS — O139 Gestational [pregnancy-induced] hypertension without significant proteinuria, unspecified trimester: Secondary | ICD-10-CM

## 2021-06-30 DIAGNOSIS — Z3A38 38 weeks gestation of pregnancy: Secondary | ICD-10-CM

## 2021-06-30 LAB — RPR: RPR Ser Ql: NONREACTIVE

## 2021-06-30 MED ORDER — SODIUM CHLORIDE 0.9 % IV SOLN: 250.0000 mL | INTRAVENOUS | Status: DC | PRN

## 2021-06-30 MED ORDER — FLEET ENEMA 7-19 GM/118ML RE ENEM
1.0000 | ENEMA | Freq: Every day | RECTAL | Status: DC | PRN
Start: 2021-06-30 — End: 2021-07-01

## 2021-06-30 MED ORDER — TETANUS-DIPHTH-ACELL PERTUSSIS 5-2.5-18.5 LF-MCG/0.5 IM SUSY: 0.5000 mL | PREFILLED_SYRINGE | Freq: Once | INTRAMUSCULAR | Status: DC

## 2021-06-30 MED ORDER — SODIUM CHLORIDE 0.9% FLUSH: 3.0000 mL | Freq: Two times a day (BID) | INTRAVENOUS | Status: DC

## 2021-06-30 MED ORDER — COCONUT OIL OIL
1.0000 "application " | TOPICAL_OIL | Status: DC | PRN
  Administered 2021-07-01: 1 via TOPICAL

## 2021-06-30 MED ORDER — MEASLES, MUMPS & RUBELLA VAC IJ SOLR
0.5000 mL | Freq: Once | INTRAMUSCULAR | Status: DC
Start: 2021-07-01 — End: 2021-07-01

## 2021-06-30 MED ORDER — PRENATAL MULTIVITAMIN CH
1.0000 | ORAL_TABLET | Freq: Every day | ORAL | Status: DC
  Administered 2021-06-30 – 2021-07-01 (×2): 1 via ORAL
  Filled 2021-06-30 (×2): qty 1

## 2021-06-30 MED ORDER — ZOLPIDEM TARTRATE 5 MG PO TABS: 5.0000 mg | ORAL_TABLET | Freq: Every evening | ORAL | Status: DC | PRN

## 2021-06-30 MED ORDER — BENZOCAINE-MENTHOL 20-0.5 % EX AERO: 1.0000 "application " | INHALATION_SPRAY | CUTANEOUS | Status: DC | PRN

## 2021-06-30 MED ORDER — IBUPROFEN 600 MG PO TABS
600.0000 mg | ORAL_TABLET | Freq: Four times a day (QID) | ORAL | Status: DC
  Administered 2021-06-30 – 2021-07-01 (×6): 600 mg via ORAL
  Filled 2021-06-30 (×6): qty 1

## 2021-06-30 MED ORDER — WITCH HAZEL-GLYCERIN EX PADS: 1.0000 "application " | MEDICATED_PAD | CUTANEOUS | Status: DC | PRN

## 2021-06-30 MED ORDER — DIBUCAINE (PERIANAL) 1 % EX OINT: 1.0000 "application " | TOPICAL_OINTMENT | CUTANEOUS | Status: DC | PRN

## 2021-06-30 MED ORDER — ONDANSETRON HCL 4 MG/2ML IJ SOLN: 4.0000 mg | INTRAMUSCULAR | Status: DC | PRN

## 2021-06-30 MED ORDER — DIPHENHYDRAMINE HCL 25 MG PO CAPS: 25.0000 mg | ORAL_CAPSULE | Freq: Four times a day (QID) | ORAL | Status: DC | PRN

## 2021-06-30 MED ORDER — SENNOSIDES-DOCUSATE SODIUM 8.6-50 MG PO TABS
2.0000 | ORAL_TABLET | Freq: Every day | ORAL | Status: DC
  Administered 2021-07-01: 2 via ORAL
  Filled 2021-06-30: qty 2

## 2021-06-30 MED ORDER — ACETAMINOPHEN 325 MG PO TABS
650.0000 mg | ORAL_TABLET | ORAL | Status: DC | PRN
  Administered 2021-06-30 – 2021-07-01 (×4): 650 mg via ORAL
  Filled 2021-06-30 (×4): qty 2

## 2021-06-30 MED ORDER — BISACODYL 10 MG RE SUPP: 10.0000 mg | Freq: Every day | RECTAL | Status: DC | PRN

## 2021-06-30 MED ORDER — SODIUM CHLORIDE 0.9% FLUSH
3.0000 mL | INTRAVENOUS | Status: DC | PRN
Start: 2021-06-30 — End: 2021-07-01

## 2021-06-30 MED ORDER — SIMETHICONE 80 MG PO CHEW: 80.0000 mg | CHEWABLE_TABLET | ORAL | Status: DC | PRN

## 2021-06-30 MED ORDER — LIDOCAINE-EPINEPHRINE (PF) 2 %-1:200000 IJ SOLN
INTRAMUSCULAR | Status: DC | PRN
  Administered 2021-06-30: 5 mL via EPIDURAL

## 2021-06-30 MED ORDER — ONDANSETRON HCL 4 MG PO TABS: 4.0000 mg | ORAL_TABLET | ORAL | Status: DC | PRN

## 2021-06-30 NOTE — Lactation Note (Signed)
This note was copied from a baby's chart. ?Lactation Consultation Note ? ?Patient Name: Cheyenne Bauer ?Today's Date: 06/30/2021 ?Reason for consult: Initial assessment;Early term 37-38.6wks ?Age:29 hours ? ?Infant had a mild dusky spell when infant had a large emesis NP& RN aware. ? ?Mom had low milk supply with her 1st child. Mom reports that trying to feed at breast/increase her milk supply impacted her mentally. As such, Mom's feeding intention is breast and formula. Parents brought in her own formula b/c they do not want to use the formula the hospital gives. However, they brought it in a powdered form. Since we cannot use their powdered formula, Mom is willing to use DBM. ? ?Pumping was begun (maternal interest/request) and obtained almost 11 mL. Cup feeding was attempted, but infant only took a small amount before showing he wasn't interested in continuing at that moment in time. Infant was placed skin-to-skin with Mom.  ? ? ?Interventions ?Interventions: Education;LC Services brochure ? ?Discharge ?Pump: Hands Free (Has a MomCozy with size 19 inserts) ? ?[name] ?06/30/2021, 1:48 PM ? ? ? ?

## 2021-06-30 NOTE — Progress Notes (Signed)
Patient ID: Cheyenne Bauer, female   DOB: [birth year], 31 y.o.   MRN: [id number] ?Cheyenne Bauer is a 31 y.o. [phone] at [redacted]w[redacted]d admitted for induction of labor due to gestational hypertension  ? ?Subjective: ?Just got epidural, Rt side comfortable, still feels pain Lt side. Denies ha, visual changes, ruq/epigastric pain, n/v.   ? ?Objective: ?BP 116/80   Pulse 85   Temp 98.1 ?F (36.7 ?C) (Oral)   Resp 18   Ht 5\' 5"  (1.651 m)   Wt 94.3 kg   LMP 10/03/2020 (Exact Date)   SpO2 99%   BMI 34.61 kg/m?  ?No intake/output data recorded. ? ?FHR baseline 120 bpm, Variability: moderate, Accelerations:present, Decelerations:  Absent ?Toco: q 1-75mins  ? ?SVE:   Dilation: 4.5 ?Effacement (%): 80 ?Station: -2 ?Exam by:: [email or web address] RN ? ?Pitocin @ 12 mu/min ? ?Labs: ?Lab Results  ?Component Value Date  ? WBC 8.1 06/29/2021  ? HGB 11.3 (L) 06/29/2021  ? HCT 35.1 (L) 06/29/2021  ? MCV 82.8 06/29/2021  ? PLT 313 06/29/2021  ? ? ?Assessment / Plan: ?IOL d/t GHTN, now has epidural, has pain Lt side, will turn on Lt. Plan AROM next exam if no change ? ?Labor: early ?Fetal Wellbeing:  Category I ?Pain Control:  epidural ?Pre-eclampsia: asymptomatic, bp's stable, and labs stable ?I/D:  GBS neg ?Anticipated MOD: NSVB ? ?08/29/2021 CNM, WHNP-BC ?06/30/2021, 12:54 AM  ?

## 2021-06-30 NOTE — Anesthesia Procedure Notes (Signed)
Epidural ?Patient location during procedure: OB ?Start time: 06/30/2021 12:15 AM ?End time: 06/30/2021 12:25 AM ? ?Staffing ?Anesthesiologist: Elmer Picker, MD ?Performed: anesthesiologist  ? ?Preanesthetic Checklist ?Completed: patient identified, IV checked, risks and benefits discussed, monitors and equipment checked, pre-op evaluation and timeout performed ? ?Epidural ?Patient position: sitting ?Prep: DuraPrep and site prepped and draped ?Patient monitoring: continuous pulse ox, blood pressure, heart rate and cardiac monitor ?Approach: midline ?Location: L3-L4 ?Injection technique: LOR air ? ?Needle:  ?Needle type: Tuohy  ?Needle gauge: 17 G ?Needle length: 9 cm ?Needle insertion depth: 5.5 cm ?Catheter type: closed end flexible ?Catheter size: 19 Gauge ?Catheter at skin depth: 11 cm ?Test dose: negative ? ?Assessment ?Sensory level: T8 ?Events: blood not aspirated, injection not painful, no injection resistance, no paresthesia and negative IV test ? ?Additional Notes ?Patient identified. Risks/Benefits/Options discussed with patient including but not limited to bleeding, infection, nerve damage, paralysis, failed block, incomplete pain control, headache, blood pressure changes, nausea, vomiting, reactions to medication both or allergic, itching and postpartum back pain. Confirmed with bedside nurse the patient's most recent platelet count. Confirmed with patient that they are not currently taking any anticoagulation, have any bleeding history or any family history of bleeding disorders. Patient expressed understanding and wished to proceed. All questions were answered. Sterile technique was used throughout the entire procedure. Please see nursing notes for vital signs. Test dose was given through epidural catheter and negative prior to continuing to dose epidural or start infusion. Warning signs of high block given to the patient including shortness of breath, tingling/numbness in hands, complete motor block,  or any concerning symptoms with instructions to call for help. Patient was given instructions on fall risk and not to get out of bed. All questions and concerns addressed with instructions to call with any issues or inadequate analgesia.  Reason for block:procedure for pain ? ? ? ?

## 2021-06-30 NOTE — Anesthesia Postprocedure Evaluation (Signed)
Anesthesia Post Note ? ?Patient: Cheyenne Bauer ? ?Procedure(s) Performed: AN AD HOC LABOR EPIDURAL ? ?  ? ?Patient location during evaluation: Mother Baby ?Anesthesia Type: Epidural ?Level of consciousness: awake and alert ?Vital Signs Assessment: post-procedure vital signs reviewed and stable ?Respiratory status: spontaneous breathing ?Cardiovascular status: stable ?Postop Assessment: no headache, adequate PO intake, no backache, patient able to bend at knees, able to ambulate, epidural receding and no apparent nausea or vomiting ?Anesthetic complications: no ? ? ?No notable events documented. ? ?Last Vitals:  ?Vitals:  ? 06/30/21 0800 06/30/21 1150  ?BP: 116/66 119/76  ?Pulse: 77 67  ?Resp: 19 20  ?Temp: 36.7 ?C 36.7 ?C  ?SpO2: 98% 100%  ?  ?Last Pain:  ?Vitals:  ? 06/30/21 1150  ?TempSrc: Oral  ?PainSc:   ? ?Pain Goal:   ? ?  ?  ?  ?  ?  ?  ?  ? ?Saphronia Ozdemir Lelan Pons ? ? ? ? ?

## 2021-06-30 NOTE — Anesthesia Preprocedure Evaluation (Signed)
Anesthesia Evaluation  ?Patient identified by MRN, date of birth, ID band ?Patient awake ? ? ? ?Reviewed: ?Allergy & Precautions, NPO status , Patient's Chart, lab work & pertinent test results ? ?Airway ?Mallampati: II ? ?TM Distance: >3 FB ?Neck ROM: Full ? ? ? Dental ?no notable dental hx. ? ?  ?Pulmonary ?asthma , former smoker,  ?  ?Pulmonary exam normal ?breath sounds clear to auscultation ? ? ? ? ? ? Cardiovascular ?hypertension (gHTN), Normal cardiovascular exam ?Rhythm:Regular Rate:Normal ? ? ?  ?Neuro/Psych ?PSYCHIATRIC DISORDERS Anxiety Depression negative neurological ROS ?   ? GI/Hepatic ?negative GI ROS, Neg liver ROS,   ?Endo/Other  ?negative endocrine ROS ? Renal/GU ?negative Renal ROS  ?negative genitourinary ?  ?Musculoskeletal ?negative musculoskeletal ROS ?(+)  ? Abdominal ?  ?Peds ? Hematology ?negative hematology ROS ?(+)   ?Anesthesia Other Findings ?IOL for gHTN ? Reproductive/Obstetrics ?(+) Pregnancy ? ?  ? ? ? ? ? ? ? ? ? ? ? ? ? ?  ?  ? ? ? ? ? ? ? ? ?Anesthesia Physical ?Anesthesia Plan ? ?ASA: 3 ? ?Anesthesia Plan: Epidural  ? ?Post-op Pain Management:   ? ?Induction:  ? ?PONV Risk Score and Plan: Treatment may vary due to age or medical condition ? ?Airway Management Planned: Natural Airway ? ?Additional Equipment:  ? ?Intra-op Plan:  ? ?Post-operative Plan:  ? ?Informed Consent: I have reviewed the patients History and Physical, chart, labs and discussed the procedure including the risks, benefits and alternatives for the proposed anesthesia with the patient or authorized representative who has indicated his/her understanding and acceptance.  ? ? ? ? ? ?Plan Discussed with: Anesthesiologist ? ?Anesthesia Plan Comments: (Patient identified. Risks, benefits, options discussed with patient including but not limited to bleeding, infection, nerve damage, paralysis, failed block, incomplete pain control, headache, blood pressure changes, nausea, vomiting,  reactions to medication, itching, and post partum back pain. Confirmed with bedside nurse the patient's most recent platelet count. Confirmed with the patient that they are not taking any anticoagulation, have any bleeding history or any family history of bleeding disorders. Patient expressed understanding and wishes to proceed. All questions were answered. )  ? ? ? ? ? ? ?Anesthesia Quick Evaluation ? ?

## 2021-06-30 NOTE — Progress Notes (Signed)
Patient ID: Cheyenne Bauer, female   DOB: 01-May-1990, 31 y.o.   MRN: [id number] [name] is a 31 y.o. [phone] at [redacted]w[redacted]d admitted for induction of labor due to gestational hypertension  ? ?Subjective: ?comfortable with epidural ? ?Objective: ?BP (!) 102/57   Pulse 73   Temp 98.1 ?F (36.7 ?C) (Oral)   Resp 18   Ht 5\' 5"  (1.651 m)   Wt 94.3 kg   LMP 10/03/2020 (Exact Date)   SpO2 99%   BMI 34.61 kg/m?  ?No intake/output data recorded. ? ?FHR baseline 120 bpm, Variability: moderate, Accelerations:present, Decelerations:  Absent ?Toco: q 1-14mins  ? ?SVE:   Dilation: 5.5 ?Effacement (%): 80 ?Station: -2 ?Exam by:: Cheyenne Rong RN ? ?Pitocin @ 16 mu/min ? ?Labs: ?Lab Results  ?Component Value Date  ? WBC 8.1 06/29/2021  ? HGB 11.3 (L) 06/29/2021  ? HCT 35.1 (L) 06/29/2021  ? MCV 82.8 06/29/2021  ? PLT 313 06/29/2021  ? ? ?Assessment / Plan: ?IOL d/t GHTN, now complete, SROM'd at some point, clear fluid ? ?Labor: active ?Fetal Wellbeing:  Category I ?Pain Control:  epidural ?Pre-eclampsia: asymptomatic and bp's stable ?I/D:  GBS neg ?Anticipated MOD: NSVB ? ?Cheyenne Bauer CNM, WHNP-BC ?06/30/2021, 4:10 AM  ?

## 2021-06-30 NOTE — Discharge Summary (Signed)
? ?  Postpartum Discharge Summary ? ?Date of Service updated ? ?   ?Patient Name: Cheyenne Bauer ?DOB: November 13, 1990 ?MRN: [id number] ? ?Date of admission: 06/29/2021 ?Delivery date:06/30/2021  ?Delivering provider: Cheral Marker  ?Date of discharge: 07/01/2021 ? ?Admitting diagnosis: Gestational hypertension [O13.9] ?Intrauterine pregnancy: [redacted]w[redacted]d     ?Secondary diagnosis:  Active Problems: ?  Depression with anxiety/BPD ?  Rubella non-immune status, antepartum ?  Gestational hypertension ? ?Additional problems: none    ?Discharge diagnosis: Term Pregnancy Delivered and Gestational Hypertension                                              ?Post partum procedures: None ?Augmentation: Pitocin ?Complications: None ? ?Hospital course: Induction of Labor With Vaginal Delivery   ?31 y.o. yo [id number] at [redacted]w[redacted]d was admitted to the hospital 06/29/2021 for induction of labor.  Indication for induction: Gestational hypertension.  Patient had an uncomplicated labor course as follows: ?Membrane Rupture Time/Date: 1:30 AM ,06/30/2021   ?Delivery Method:Vaginal, Spontaneous  ?Episiotomy: None  ?Lacerations:  None  ?Details of delivery can be found in separate delivery note.  Patient had a routine postpartum course. Her BP was WNL postpartum (w/ exception of 140/82 with incorrect placement). She was discharged home with lasix and baby scripts. She has a BP cuff at home. Patient is discharged home 07/01/21. ? ?Newborn Data: ?Birth date:06/30/2021  ?Birth time:5:01 AM  ?Gender:Female  ?Living status:Living  ?Apgars:9 ,9  ?Weight:3544 g  ? ?Magnesium Sulfate received: No ?BMZ received: No ?Rhophylac:N/A ?T-DaP:Given prenatally ?Flu: No ?Transfusion:No ? ?Physical exam  ?Vitals:  ? 06/30/21 1150 06/30/21 1550 06/30/21 2009 07/01/21 0638  ?BP: 119/76 140/82 124/74 121/65  ?Pulse: 67 (!) 59 78 69  ?Resp: 20 18 18 18   ?Temp: 98 ?F (36.7 ?C) 98.3 ?F (36.8 ?C) 98.1 ?F (36.7 ?C) 98 ?F (36.7 ?C)  ?TempSrc: Oral Oral Oral Oral  ?SpO2: 100% 100%    ?Weight:       ?Height:      ? ?General: alert, cooperative, and no distress ?Lochia: appropriate ?Uterine Fundus: firm ?Incision: Dressing is clean, dry, and intact ?DVT Evaluation: No evidence of DVT seen on physical exam. ?Labs: ?Lab Results  ?Component Value Date  ? WBC 8.1 06/29/2021  ? HGB 11.3 (L) 06/29/2021  ? HCT 35.1 (L) 06/29/2021  ? MCV 82.8 06/29/2021  ? PLT 313 06/29/2021  ? ? ?  Latest Ref Rng & Units 06/29/2021  ?  6:30 PM  ?CMP  ?Glucose 70 - 99 mg/dL 89    ?BUN 6 - 20 mg/dL 13    ?Creatinine 0.44 - 1.00 mg/dL 08/29/2021    ?Sodium 135 - 145 mmol/L 139    ?Potassium 3.5 - 5.1 mmol/L 3.9    ?Chloride 98 - 111 mmol/L 109    ?CO2 22 - 32 mmol/L 21    ?Calcium 8.9 - 10.3 mg/dL 8.9    ?Total Protein 6.5 - 8.1 g/dL 5.8    ?Total Bilirubin 0.3 - 1.2 mg/dL 0.3    ?Alkaline Phos 38 - 126 U/L 115    ?AST 15 - 41 U/L 21    ?ALT 0 - 44 U/L 16    ? ?Edinburgh Score: ? ?  06/30/2021  ? 11:50 AM  ?08/30/2021 Postnatal Depression Scale Screening Tool  ?I have been able to laugh and see the funny side of  things. 0  ?I have looked forward with enjoyment to things. 0  ?I have blamed myself unnecessarily when things went wrong. 1  ?I have been anxious or worried for no good reason. 0  ?I have felt scared or panicky for no good reason. 0  ?Things have been getting on top of me. 1  ?I have been so unhappy that I have had difficulty sleeping. 0  ?I have felt sad or miserable. 0  ?I have been so unhappy that I have been crying. 1  ?The thought of harming myself has occurred to me. 0  ?Edinburgh Postnatal Depression Scale Total 3  ? ? ? ?After visit meds:  ?Allergies as of 07/01/2021   ? ?   Reactions  ? Covid-19 Mrna Vaccine (pfizer) [phone] Mrna Vacc (moderna)] Rash  ? Latex Rash  ? ?  ? ?  ?Medication List  ?  ? ?STOP taking these medications   ? ?aspirin 81 MG EC tablet ?  ?Bonjesta 20-20 MG Tbcr ?Generic drug: Doxylamine-Pyridoxine ER ?  ?multivitamin-prenatal 27-0.8 MG Tabs tablet ?  ? ?  ? ?TAKE these medications   ? ?acetaminophen 325 MG  tablet ?Commonly known as: Tylenol ?Take 2 tablets (650 mg total) by mouth every 4 (four) hours as needed (for pain scale < 4). ?  ?ferrous sulfate 325 (65 FE) MG tablet ?Take 1 tablet (325 mg total) by mouth every other day. ?  ?furosemide 20 MG tablet ?Commonly known as: Lasix ?Take 1 tablet (20 mg total) by mouth daily for 5 days. ?  ?ibuprofen 600 MG tablet ?Commonly known as: ADVIL ?Take 1 tablet (600 mg total) by mouth every 6 (six) hours. ?  ? ?  ? ? ? ?Discharge home in stable condition ?Infant Feeding: Bottle and Breast ?Infant Disposition:home with mother ?Discharge instruction: per After Visit Summary and Postpartum booklet. ?Activity: Advance as tolerated. Pelvic rest for 6 weeks.  ?Diet: routine diet ?Future Appointments: ?Future Appointments  ?Date Time Provider Department Center  ?07/07/2021 10:30 AM [facility] NURSE [facility]  ?08/05/2021 11:10 AM Cresenzo-Dishmon, Scarlette Calico, CNM [facility]  ? ?Follow up Visit: ?Cheral Marker, CNM  Candie Chroman ?Please schedule this patient for PP visit in: bp check 1wk, pp visit 4-6wks  ?High risk pregnancy complicated by: HTN  ?Delivery mode:  SVD  ?Anticipated Birth Control:  Condoms  ?PP Procedures needed: BP check  ?Schedule Integrated BH visit: yes  ?Provider: me  ? ?07/01/2021 ?Allayne Stack, DO ? ? ? ?

## 2021-06-30 NOTE — Plan of Care (Signed)
?  Problem: Education: ?Goal: Knowledge of General Education information will improve ?Description: Including pain rating scale, medication(s)/side effects and non-pharmacologic comfort measures ?06/30/2021 0538 by Joretta Bachelor, RN ?Outcome: Progressing ?06/29/2021 2307 by Joretta Bachelor, RN ?Outcome: Progressing ?  ?Problem: Health Behavior/Discharge Planning: ?Goal: Ability to manage health-related needs will improve ?06/30/2021 0538 by Joretta Bachelor, RN ?Outcome: Progressing ?06/29/2021 2307 by Joretta Bachelor, RN ?Outcome: Progressing ?  ?Problem: Clinical Measurements: ?Goal: Ability to maintain clinical measurements within normal limits will improve ?06/30/2021 0538 by Joretta Bachelor, RN ?Outcome: Progressing ?06/29/2021 2307 by Joretta Bachelor, RN ?Outcome: Progressing ?Goal: Will remain free from infection ?06/30/2021 0538 by Joretta Bachelor, RN ?Outcome: Progressing ?06/29/2021 2307 by Joretta Bachelor, RN ?Outcome: Progressing ?Goal: Diagnostic test results will improve ?06/30/2021 0538 by Joretta Bachelor, RN ?Outcome: Progressing ?06/29/2021 2307 by Joretta Bachelor, RN ?Outcome: Progressing ?Goal: Respiratory complications will improve ?06/30/2021 0538 by Joretta Bachelor, RN ?Outcome: Progressing ?06/29/2021 2307 by Joretta Bachelor, RN ?Outcome: Progressing ?Goal: Cardiovascular complication will be avoided ?06/30/2021 0538 by Joretta Bachelor, RN ?Outcome: Progressing ?06/29/2021 2307 by Joretta Bachelor, RN ?Outcome: Progressing ?  ?Problem: Activity: ?Goal: Risk for activity intolerance will decrease ?06/30/2021 0538 by Joretta Bachelor, RN ?Outcome: Progressing ?06/29/2021 2307 by Joretta Bachelor, RN ?Outcome: Progressing ?  ?Problem: Nutrition: ?Goal: Adequate nutrition will be maintained ?06/30/2021 0538 by Joretta Bachelor, RN ?Outcome: Progressing ?06/29/2021 2307 by Joretta Bachelor, RN ?Outcome: Progressing ?  ?Problem: Coping: ?Goal: Level of anxiety will decrease ?06/30/2021 0538 by Joretta Bachelor, RN ?Outcome:  Progressing ?06/29/2021 2307 by Joretta Bachelor, RN ?Outcome: Progressing ?  ?Problem: Elimination: ?Goal: Will not experience complications related to bowel motility ?06/30/2021 0538 by Joretta Bachelor, RN ?Outcome: Progressing ?06/29/2021 2307 by Joretta Bachelor, RN ?Outcome: Progressing ?Goal: Will not experience complications related to urinary retention ?06/30/2021 0538 by Joretta Bachelor, RN ?Outcome: Progressing ?06/29/2021 2307 by Joretta Bachelor, RN ?Outcome: Progressing ?  ?Problem: Pain Managment: ?Goal: General experience of comfort will improve ?06/30/2021 0538 by Joretta Bachelor, RN ?Outcome: Progressing ?06/29/2021 2307 by Joretta Bachelor, RN ?Outcome: Progressing ?  ?Problem: Safety: ?Goal: Ability to remain free from injury will improve ?06/30/2021 0538 by Joretta Bachelor, RN ?Outcome: Progressing ?06/29/2021 2307 by Joretta Bachelor, RN ?Outcome: Progressing ?  ?Problem: Skin Integrity: ?Goal: Risk for impaired skin integrity will decrease ?06/30/2021 0538 by Joretta Bachelor, RN ?Outcome: Progressing ?06/29/2021 2307 by Joretta Bachelor, RN ?Outcome: Progressing ?  ?

## 2021-07-01 ENCOUNTER — Other Ambulatory Visit (HOSPITAL_COMMUNITY): Payer: Self-pay

## 2021-07-01 MED ORDER — FUROSEMIDE 20 MG PO TABS
20.0000 mg | ORAL_TABLET | Freq: Every day | ORAL | 0 refills | Status: DC
  Filled 2021-07-01: qty 5, 5d supply, fill #0

## 2021-07-01 MED ORDER — ACETAMINOPHEN 325 MG PO TABS
650.0000 mg | ORAL_TABLET | ORAL | 0 refills | Status: DC | PRN
Start: 2021-07-01 — End: 2022-05-03
  Filled 2021-07-01: qty 15, 2d supply, fill #0

## 2021-07-01 MED ORDER — IBUPROFEN 600 MG PO TABS
600.0000 mg | ORAL_TABLET | Freq: Four times a day (QID) | ORAL | 0 refills | Status: DC
Start: 2021-07-01 — End: 2022-05-03
  Filled 2021-07-01: qty 15, 4d supply, fill #0

## 2021-07-01 NOTE — Lactation Note (Addendum)
This note was copied from a baby's chart. ?Lactation Consultation Note ? ?Patient Name: Cheyenne Bauer ?Today's Date: 07/01/2021 ?Reason for consult: Follow-up assessment;Early term 37-38.6wks (-4% weight loss) ?Age:29 hours ?Per mom, BF is going well, infant currently asleep. ?Per parents, infant has Pediatrician, MD appointment at [facility] tomorrow at 1430 pm.  ?Mom will continue to BF infant on demand, by cues, 8 to 12 + or more times within 24 hours, skin to skin. ?LC discussed discharge education: engorgement prevention and treatment, how to know breastfeeding is going well and warning signs for feeding baby. ?[place] reviewed : [facility] outpatient clinic, [place] hotline and online breastfeeding support group. ?Maternal Data ?  ? ?Feeding ?Mother's Current Feeding Choice: Breast Milk ? ?LATCH Score ?  ? ?  ? ?  ? ?  ? ?  ? ?  ? ? ?Lactation Tools Discussed/Used ?  ? ?Interventions ?  ? ?Discharge ?Discharge Education: Engorgement and breast care;Warning signs for feeding baby ? ?Consult Status ?Consult Status: Complete ?Date: 07/01/21 ?Follow-up type: Physician ? ? ? ?Vicente Serene ?07/01/2021, 4:40 PM ? ? ? ?
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
## 2021-07-05 ENCOUNTER — Encounter: Payer: Self-pay | Admitting: Women's Health

## 2021-07-05 ENCOUNTER — Other Ambulatory Visit: Payer: Self-pay | Admitting: Obstetrics & Gynecology

## 2021-07-05 MED ORDER — CYCLOBENZAPRINE HCL 10 MG PO TABS: 10.0000 mg | ORAL_TABLET | Freq: Three times a day (TID) | ORAL | 1 refills | Status: DC | PRN

## 2021-07-06 ENCOUNTER — Encounter: Payer: Managed Care, Other (non HMO) | Admitting: Women's Health

## 2021-07-07 ENCOUNTER — Other Ambulatory Visit: Payer: Self-pay | Admitting: Obstetrics & Gynecology

## 2021-07-07 ENCOUNTER — Encounter: Payer: Self-pay | Admitting: Family Medicine

## 2021-07-07 ENCOUNTER — Ambulatory Visit (INDEPENDENT_AMBULATORY_CARE_PROVIDER_SITE_OTHER): Payer: Managed Care, Other (non HMO)

## 2021-07-07 VITALS — BP 115/84 | HR 100 | Wt 188.4 lb

## 2021-07-07 DIAGNOSIS — Z0130 Encounter for examination of blood pressure without abnormal findings: Secondary | ICD-10-CM

## 2021-07-07 NOTE — Progress Notes (Signed)
? ?  NURSE VISIT- BLOOD PRESSURE CHECK ? ?SUBJECTIVE:  ?Cheyenne Bauer is a 31 y.o. [phone] female here for BP check. She is postpartum, delivery date 06/30/21    ? ?HYPERTENSION ROS: ? ?Postpartum:  ?Severe headaches that don't go away with tylenol/other medicines: No  ?Visual changes (seeing spots/double/blurred vision) No  ?Severe pain under right breast breast or in center of upper chest No  ?Severe nausea/vomiting No  ?Taking medicines as instructed not applicable ? ?OBJECTIVE:  ?BP 115/84 (BP Location: Right Arm, Patient Position: Sitting, Cuff Size: Normal)   Pulse 100   Wt 188 lb 6.4 oz (85.5 kg)   LMP 10/03/2020 (Exact Date)   Breastfeeding Yes   BMI 31.35 kg/m?   ?Appearance alert, well appearing, and in no distress, oriented to person, place, and time, and normal appearing weight. ? ?ASSESSMENT: ?Postpartum  blood pressure check ? ?PLAN: ?Discussed with Dr. Nelda Marseille   ?Recommendations: no changes needed   ?Follow-up: as scheduled  ? ?Brooks Kinnan A Arend Bahl  ?07/07/2021 ?10:42 AM  ?

## 2021-07-07 NOTE — Progress Notes (Signed)
Rx called in to [facility] for all-purpose nipple ointment ? ?Myna Hidalgo, DO ?Attending Obstetrician & Gynecologist, Faculty Practice ?Center for [employer], [facility] ? ? ?

## 2021-07-08 ENCOUNTER — Encounter: Payer: Self-pay | Admitting: Women's Health

## 2021-07-23 ENCOUNTER — Other Ambulatory Visit: Payer: Self-pay | Admitting: Advanced Practice Midwife

## 2021-07-23 ENCOUNTER — Encounter: Payer: Self-pay | Admitting: Women's Health

## 2021-07-23 MED ORDER — FLUCONAZOLE 150 MG PO TABS: 150.0000 mg | ORAL_TABLET | ORAL | 0 refills | Status: DC

## 2021-08-05 ENCOUNTER — Ambulatory Visit (INDEPENDENT_AMBULATORY_CARE_PROVIDER_SITE_OTHER): Payer: Managed Care, Other (non HMO) | Admitting: Advanced Practice Midwife

## 2021-08-05 ENCOUNTER — Encounter: Payer: Self-pay | Admitting: Advanced Practice Midwife

## 2021-08-05 NOTE — Progress Notes (Signed)
Post Partum Visit Note   Chief Complaint:   Postpartum Care  History of Present Illness:   Cheyenne Bauer is a 31 y.o. [phone]  female being seen today for a postpartum visit. She is 5 weeks postpartum following a spontaneous vaginal delivery at 38.4 gestational weeks. IOL: Yes, for Gestational hypertension. Anesthesia: epidural.  Laceration: 1st degree.  Complications: none. Inpatient contraception: no.   Pregnancy complicated by [facility] . Tobacco use: no. Substance use disorder: no. Last pap smear: 07/20/20 and results were NILM w/ HRHPV negative. Next pap smear due: 2025 No LMP recorded.  Postpartum course has been uncomplicated. Bleeding no bleeding. Bowel function is normal. Bladder function is normal. Urinary incontinence? No, fecal incontinence? No Patient is not sexually active. Last sexual activity: prior to birth.    Upstream - 08/05/21 1114       Pregnancy Intention Screening   Does the patient want to become pregnant in the next year? No    Does the patient's partner want to become pregnant in the next year? No    Would the patient like to discuss contraceptive options today? No      Contraception Wrap Up   Current Method No Contraceptive Precautions    End Method No Contraception Precautions    Contraception Counseling Provided No            The pregnancy intention screening data noted above was reviewed. Potential methods of contraception were discussed. The patient elected to proceed with No Contraception Precautions.  Edinburgh Postpartum Depression Screening: Negative  Edinburgh Postnatal Depression Scale - 08/05/21 1113       Edinburgh Postnatal Depression Scale:  In the Past 7 Days   I have been able to laugh and see the funny side of things. 0    I have looked forward with enjoyment to things. 0    I have blamed myself unnecessarily when things went wrong. 1    I have been anxious or worried for no good reason. 1    I have felt scared or panicky for no good  reason. 1    Things have been getting on top of me. 2    I have been so unhappy that I have had difficulty sleeping. 1    I have felt sad or miserable. 1    I have been so unhappy that I have been crying. 1    The thought of harming myself has occurred to me. 0    Edinburgh Postnatal Depression Scale Total 8            Baby's course has been uncomplicated. Baby is feeding by bottle. Infant has a pediatrician/family doctor? Yes.  Childcare strategy if returning to work/school: n/a.  Pt has material needs met for her and baby: Yes.   Review of Systems:   Pertinent items are noted in HPI Denies Abnormal vaginal discharge w/ itching/odor/irritation, headaches, visual changes, shortness of breath, chest pain, abdominal pain, severe nausea/vomiting, or problems with urination or bowel movements. Pertinent History Reviewed:  Reviewed past medical,surgical, obstetrical and family history.  Reviewed problem list, medications and allergies. OB History  Gravida Para Term Preterm AB Living  '4 2 2   2 2  ' SAB IAB Ectopic Multiple Live Births  1 1   0 2    # Outcome Date GA Lbr Len/2nd Weight Sex Delivery Anes PTL Lv  4 Term 06/30/21 [redacted]w[redacted]d/ 01:05 7 lb 13 oz (3.544 kg) M Vag-Spont EPI  LIV  Birth Comments: WDL  3 Term 12/08/18 [id number]:15 / 00:41 7 lb 6 oz (3.345 kg) M Vag-Spont EPI N LIV     Birth Comments: none     Complications: Gestational hypertension  2 SAB 01/28/18          1 IAB            Physical Assessment:   Vitals:   08/05/21 1114  BP: 117/78  Pulse: 93  There is no height or weight on file to calculate BMI.  Objective:  Blood pressure 117/78, pulse 93, not currently breastfeeding.  General:  alert, cooperative, and no distress   Breasts:  negative  Lungs: Normal respiratory effort  Heart:  regular rate and rhythm  Abdomen: soft, non-tender,    Vulva:  normal  Vagina: normal vagina  Cervix:  normal  Corpus: Well involuted  Adnexa:  not evaluated  Rectal Exam:  small hemorrhoids          No results found for this or any previous visit (from the past 24 hour(s)).  Assessment & Plan:  1) Postpartum exam 2) 5 wks s/p spontaneous vaginal delivery 3) bottle feeding 4) Depression screening 5) Contraception counseling  Essential components of care per ACOG recommendations:  1.  Mood and well being:  If positive depression screen, discussed and plan developed.  If using tobacco we discussed reduction/cessation and risk of relapse If current substance abuse, we discussed and referral to local resources was offered.   2. Infant care and feeding:  If breastfeeding, discussed returning to work, pumping, breastfeeding-associated pain, guidance regarding return to fertility while lactating if not using another method. If needed, patient was provided with a letter to be allowed to pump q 2-3hrs to support lactation in a private location with access to a refrigerator to store breastmilk.   Recommended that all caregivers be immunized for flu, pertussis and other preventable communicable diseases If pt does not have material needs met for her/baby, referred to local resources for help obtaining these.  3. Sexuality, contraception and birth spacing Provided guidance regarding sexuality, management of dyspareunia, and resumption of intercourse Discussed avoiding interpregnancy interval <624ms and recommended birth spacing of 18 months  4. Sleep and fatigue Discussed coping options for fatigue and sleep disruption Encouraged family/partner/community support of 4 hrs of uninterrupted sleep to help with mood and fatigue  5. Physical recovery  If pt had a C/S, assessed incisional pain and providing guidance on normal vs prolonged recovery If pt had a laceration, perineal healing and pain reviewed.  If urinary or fecal incontinence, discussed management and referred to PT or uro/gyn if indicated  Patient is safe to resume physical activity. Discussed attainment  of healthy weight.  6.  Chronic disease management Discussed pregnancy complications if any, and their implications for future childbearing and long-term maternal health. Review recommendations for prevention of recurrent pregnancy complications, such as 17 hydroxyprogesterone caproate to reduce risk for recurrent PTB not applicable, or aspirin to reduce risk of preeclampsia yes. Pt had GDM: No. If yes, 2hr GTT scheduled: not applicable. Reviewed medications and non-pregnant dosing including consideration of whether pt is breastfeeding using a reliable resource such as LactMed: not applicable Referred for f/u w/ PCP or subspecialist providers as indicated: not applicable  7. Health maintenance Mammogram at 4066yor earlier if indicated Pap smears as indicated  Meds: No orders of the defined types were placed in this encounter.   Follow-up: No follow-ups on file.   No orders of the  defined types were placed in this encounter.      [place] for [employer], [place] [facility] 08/05/2021 11:42 AM

## 2022-05-03 ENCOUNTER — Encounter: Payer: Self-pay | Admitting: Adult Health

## 2022-05-03 ENCOUNTER — Ambulatory Visit (INDEPENDENT_AMBULATORY_CARE_PROVIDER_SITE_OTHER): Payer: Managed Care, Other (non HMO) | Admitting: Adult Health

## 2022-05-03 VITALS — BP 112/69 | HR 75 | Ht 66.0 in | Wt 157.5 lb

## 2022-05-03 DIAGNOSIS — N926 Irregular menstruation, unspecified: Secondary | ICD-10-CM | POA: Insufficient documentation

## 2022-05-03 DIAGNOSIS — F32A Depression, unspecified: Secondary | ICD-10-CM | POA: Diagnosis not present

## 2022-05-03 DIAGNOSIS — F419 Anxiety disorder, unspecified: Secondary | ICD-10-CM | POA: Diagnosis not present

## 2022-05-03 DIAGNOSIS — Z3202 Encounter for pregnancy test, result negative: Secondary | ICD-10-CM | POA: Insufficient documentation

## 2022-05-03 LAB — POCT URINE PREGNANCY: Preg Test, Ur: NEGATIVE

## 2022-05-03 MED ORDER — BUSPIRONE HCL 5 MG PO TABS
5.0000 mg | ORAL_TABLET | Freq: Three times a day (TID) | ORAL | 3 refills | Status: DC
Start: 2022-05-03 — End: 2023-01-06

## 2022-05-03 MED ORDER — ESCITALOPRAM OXALATE 10 MG PO TABS: 10.0000 mg | ORAL_TABLET | Freq: Every day | ORAL | 3 refills | Status: DC

## 2022-05-03 NOTE — Progress Notes (Signed)
Subjective:     Patient ID: Cheyenne Bauer, female   DOB: 1991/01/31, 32 y.o.   MRN: [id number]  HPI Cheyenne Bauer is a 32 year old white female,married, [id number], in complaining of having missed a period and had negative HPTs. She is having anxiety and feelings of  rage. Her dad was diagnosed with stage 4 brain cancer last year, has 2 kids at home and husband works 12-14 hours.  She has history of depression, on no meds.      Component Value Date/Time   DIAGPAP  07/20/2020 1149    - Negative for intraepithelial lesion or malignancy (NILM)   DIAGPAP  04/07/2017 0000    NEGATIVE FOR INTRAEPITHELIAL LESIONS OR MALIGNANCY.   [facility] Negative 07/20/2020 1149   ADEQPAP  07/20/2020 1149    Satisfactory for evaluation; transformation zone component PRESENT.   ADEQPAP  04/07/2017 0000    Satisfactory for evaluation  endocervical/transformation zone component PRESENT.   PCP is Dr Quillian Quince.  Review of Systems +missed period +anxiety and feels of rage    Reviewed past medical,surgical, social and family history. Reviewed medications and allergies.  Objective:   Physical Exam BP 112/69 (BP Location: Left Arm, Patient Position: Sitting, Cuff Size: Normal)   Pulse 75   Ht '5\' 6"'$  (1.676 m)   Wt 157 lb 8 oz (71.4 kg)   LMP 03/09/2022   Breastfeeding No   BMI 25.42 kg/m     UPT is negative Skin warm and dry. Neck: mid line trachea, normal thyroid, good ROM, no lymphadenopathy noted. Lungs: clear to ausculation bilaterally. Cardiovascular: regular rate and rhythm.  Fall risk is low    05/03/2022   11:27 AM 04/23/2021    9:10 AM 12/29/2020    3:19 PM  Depression screen PHQ 2/9  Decreased Interest 1 0 1  Down, Depressed, Hopeless 1 0 1  PHQ - 2 Score 2 0 2  Altered sleeping 0 1 1  Tired, decreased energy '1 1 1  '$ Change in appetite 3 0 1  Feeling bad or failure about yourself  2 0 1  Trouble concentrating '1 1 1  '$ Moving slowly or fidgety/restless 0 0 1  Suicidal thoughts 1 0 1  PHQ-9 Score '10 3 9        '$ 05/03/2022   11:29 AM 04/23/2021    9:11 AM 12/29/2020    3:19 PM 07/20/2020   11:39 AM  GAD 7 : Generalized Anxiety Score  Nervous, Anxious, on Edge 3 0 1 1  Control/stop worrying 3 0 1 1  Worry too much - different things '3 1 1 1  '$ Trouble relaxing 3 0 1 1  Restless 2 0 1 1  Easily annoyed or irritable '3 1 1 1  '$ Afraid - awful might happen 0 0 1 1  Total GAD 7 Score '17 2 7 7      '$ Upstream - 05/03/22 1125       Pregnancy Intention Screening   Does the patient want to become pregnant in the next year? Ok Either Way    Does the patient's partner want to become pregnant in the next year? Ok Either Way    Would the patient like to discuss contraceptive options today? No      Contraception Wrap Up   Current Method Female Condom    End Method Female Condom             Assessment:     1. Pregnancy examination or test, negative result  2. Missed  period +missed period with negative HPT and negative UPT in office Will check TSH and free T4, will message on Mychart when results back   3. Anxiety and depression +anxiety and feelings of rage Will rx Buspar 5 mg 1 tid and lexapro 10 mg 1 daily  Meds ordered this encounter  Medications   busPIRone (BUSPAR) 5 MG tablet    Sig: Take 1 tablet (5 mg total) by mouth 3 (three) times daily.    Dispense:  90 tablet    Refill:  3    Order Specific Question:   Supervising Provider    Answer:   Elonda Husky, LUTHER H [[id number]]   escitalopram (LEXAPRO) 10 MG tablet    Sig: Take 1 tablet (10 mg total) by mouth daily.    Dispense:  30 tablet    Refill:  3    Order Specific Question:   Supervising Provider    Answer:   Florian Buff [[id number]]       Plan:     Follow up in 8 weeks for ROS on starting Buspar and lexapro, and see if has had period, if not will check UPT again and try to get withdrawal bleed with Provera then

## 2022-05-04 LAB — TSH+FREE T4
Free T4: 1.39 ng/dL (ref 0.82–1.77)
TSH: 1.11 u[IU]/mL (ref 0.450–4.500)

## 2022-06-28 ENCOUNTER — Ambulatory Visit: Payer: Managed Care, Other (non HMO) | Admitting: Adult Health

## 2023-01-06 ENCOUNTER — Ambulatory Visit (INDEPENDENT_AMBULATORY_CARE_PROVIDER_SITE_OTHER): Payer: Managed Care, Other (non HMO) | Admitting: *Deleted

## 2023-01-06 VITALS — BP 117/79 | HR 97 | Ht 65.0 in | Wt 149.0 lb

## 2023-01-06 DIAGNOSIS — Z3201 Encounter for pregnancy test, result positive: Secondary | ICD-10-CM

## 2023-01-06 DIAGNOSIS — N926 Irregular menstruation, unspecified: Secondary | ICD-10-CM | POA: Diagnosis not present

## 2023-01-06 LAB — POCT URINE PREGNANCY: Preg Test, Ur: POSITIVE — AB

## 2023-01-06 NOTE — Progress Notes (Signed)
   NURSE VISIT- PREGNANCY CONFIRMATION   SUBJECTIVE:  Cheyenne Bauer is a 32 y.o. [phone] female at [redacted]w[redacted]d by certain LMP of Patient's last menstrual period was 12/15/2022. Here for pregnancy confirmation.  Home pregnancy test: positive x 2   She reports cramping.  She is not taking prenatal vitamins.    OBJECTIVE:  BP 117/79 (BP Location: Left Arm, Patient Position: Sitting, Cuff Size: Normal)   Pulse 97   Ht 5\' 5"  (1.651 m)   Wt 149 lb (67.6 kg)   LMP 12/15/2022   Breastfeeding No   BMI 24.79 kg/m   Appears well, in no apparent distress  Results for orders placed or performed in visit on 01/06/23 (from the past 24 hour(s))  POCT urine pregnancy   Collection Time: 01/06/23 10:19 AM  Result Value Ref Range   Preg Test, Ur Positive (A) Negative    ASSESSMENT: Positive pregnancy test, [redacted]w[redacted]d by LMP    PLAN: Schedule for dating ultrasound in 4 weeks Prenatal vitamins: plans to begin OTC ASAP   Nausea medicines: not currently needed   OB packet given: No, pt still has 2 from previous pregnancies  Jobe Marker  01/06/2023 10:19 AM

## 2023-02-02 ENCOUNTER — Other Ambulatory Visit: Payer: Self-pay | Admitting: Obstetrics & Gynecology

## 2023-02-02 DIAGNOSIS — O3680X0 Pregnancy with inconclusive fetal viability, not applicable or unspecified: Secondary | ICD-10-CM

## 2023-02-03 ENCOUNTER — Ambulatory Visit: Payer: Managed Care, Other (non HMO)

## 2023-02-03 DIAGNOSIS — Z3A01 Less than 8 weeks gestation of pregnancy: Secondary | ICD-10-CM

## 2023-02-03 DIAGNOSIS — Z3491 Encounter for supervision of normal pregnancy, unspecified, first trimester: Secondary | ICD-10-CM | POA: Diagnosis not present

## 2023-02-03 DIAGNOSIS — O3680X0 Pregnancy with inconclusive fetal viability, not applicable or unspecified: Secondary | ICD-10-CM

## 2023-02-03 NOTE — Progress Notes (Signed)
[place] 7+1 wks,single IUP with yolk sac,FHR 160 bpm,normal ovaries,CRL 15.17 mm

## 2023-02-17 ENCOUNTER — Ambulatory Visit: Payer: Managed Care, Other (non HMO) | Admitting: Obstetrics & Gynecology

## 2023-02-17 ENCOUNTER — Encounter: Payer: Self-pay | Admitting: Obstetrics & Gynecology

## 2023-02-17 ENCOUNTER — Encounter: Payer: Self-pay | Admitting: Advanced Practice Midwife

## 2023-02-17 VITALS — BP 119/79 | HR 91

## 2023-02-17 DIAGNOSIS — Z3A00 Weeks of gestation of pregnancy not specified: Secondary | ICD-10-CM

## 2023-02-17 DIAGNOSIS — O209 Hemorrhage in early pregnancy, unspecified: Secondary | ICD-10-CM | POA: Diagnosis not present
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
## 2023-03-01 NOTE — L&D Delivery Note (Signed)
 OB/GYN Faculty Practice Delivery Note  JEANNETT DEKONING is a 33 y.o. [id number] s/p NSVD at [redacted]w[redacted]d. She was admitted for IOL for gestational HTN.   ROM: 2h 59m with clear fluid GBS Status: neg Maximum Maternal Temperature: 98.57F  Labor Progress: Progressed to complete following Cytotec , AROM, and Pitocin .  Delivery Date/Time: 09/15/23 @ 1432 Delivery: Called to room because patient is complete and feeling urge to push. Head delivered LOA, restituted to LOT. No nuchal cord present. Posterior (left) compound arm. Shoulder and body delivered in usual fashion. Infant with spontaneous cry, placed on mother's abdomen, dried and stimulated. Cord clamped x 2 after 30 second delay, and cut by father of baby. Cord blood collected for banking and handed off to father of baby. Cord blood collected for labs. Placenta delivered spontaneously, intact, with 3-vessel cord. Fundus firm with massage and Pitocin . Labia, perineum, vagina, and cervix inspected; 1st degree perineal laceration found and repaired with 4.0 monocryl.   Placenta: intact, home with patient Complications: none Lacerations: 1st degree perineal, repaired with 4.0 monocryl EBL: Analgesia: epidural  Infant: female  APGARs 9,9  3390g  Vernell Ruddle, SNM 09/15/2023, 4:06 PM

## 2023-03-08 ENCOUNTER — Other Ambulatory Visit: Payer: Self-pay | Admitting: Obstetrics & Gynecology

## 2023-03-08 DIAGNOSIS — Z3682 Encounter for antenatal screening for nuchal translucency: Secondary | ICD-10-CM

## 2023-03-09 ENCOUNTER — Ambulatory Visit: Payer: Managed Care, Other (non HMO)

## 2023-03-09 ENCOUNTER — Encounter: Payer: Self-pay | Admitting: Advanced Practice Midwife

## 2023-03-09 ENCOUNTER — Ambulatory Visit: Payer: Managed Care, Other (non HMO) | Admitting: *Deleted

## 2023-03-09 ENCOUNTER — Ambulatory Visit (INDEPENDENT_AMBULATORY_CARE_PROVIDER_SITE_OTHER): Payer: Managed Care, Other (non HMO) | Admitting: Advanced Practice Midwife

## 2023-03-09 DIAGNOSIS — Z3481 Encounter for supervision of other normal pregnancy, first trimester: Secondary | ICD-10-CM | POA: Diagnosis not present

## 2023-03-09 DIAGNOSIS — Z3A12 12 weeks gestation of pregnancy: Secondary | ICD-10-CM | POA: Diagnosis not present

## 2023-03-09 DIAGNOSIS — Z363 Encounter for antenatal screening for malformations: Secondary | ICD-10-CM

## 2023-03-09 DIAGNOSIS — Z131 Encounter for screening for diabetes mellitus: Secondary | ICD-10-CM

## 2023-03-09 DIAGNOSIS — Z3480 Encounter for supervision of other normal pregnancy, unspecified trimester: Secondary | ICD-10-CM | POA: Insufficient documentation

## 2023-03-09 DIAGNOSIS — Z8759 Personal history of other complications of pregnancy, childbirth and the puerperium: Secondary | ICD-10-CM | POA: Diagnosis not present

## 2023-03-09 DIAGNOSIS — Z3682 Encounter for antenatal screening for nuchal translucency: Secondary | ICD-10-CM

## 2023-03-09 MED ORDER — ASPIRIN 81 MG PO TBEC: 162.0000 mg | DELAYED_RELEASE_TABLET | Freq: Every day | ORAL | 6 refills | Status: DC

## 2023-03-09 MED ORDER — DOXYLAMINE-PYRIDOXINE 10-10 MG PO TBEC: DELAYED_RELEASE_TABLET | ORAL | 6 refills | Status: DC

## 2023-03-09 NOTE — Progress Notes (Signed)
 [place] 12 wks,CRL 67.98 mm,NB present,NT 1.5 mm,posterior placenta,normal ovaries,FHR 150 bpm

## 2023-03-09 NOTE — Patient Instructions (Signed)
 Cheyenne Bauer, I greatly value your feedback.  If you receive a survey following your visit with us  today, we appreciate you taking the time to fill it out.  Thanks, Sherrell Ely, DNP, CNM  [facility] HAS MOVED!!! It is now [facility] at [facility] ([address] [place], [place] [address]) Entrance located off of [address] Free 24/7 valet parking   Nausea & Vomiting Have saltine crackers or pretzels by your bed and eat a few bites before you raise your head out of bed in the morning Eat small frequent meals throughout the day instead of large meals Drink plenty of fluids throughout the day to stay hydrated, just don't drink a lot of fluids with your meals.  This can make your stomach fill up faster making you feel sick Do not brush your teeth right after you eat Products with real ginger are good for nausea, like ginger ale and ginger hard candy Make sure it says made with real ginger! Sucking on sour candy like lemon heads is also good for nausea If your prenatal vitamins make you nauseated, take them at night so you will sleep through the nausea Sea Bands If you feel like you need medicine for the nausea & vomiting please let us  know If you are unable to keep any fluids or food down please let us  know   Constipation Drink plenty of fluid, preferably water, throughout the day Eat foods high in fiber such as fruits, vegetables, and grains Exercise, such as walking, is a good way to keep your bowels regular Drink warm fluids, especially warm prune juice, or decaf coffee Eat a 1/2 cup of real oatmeal (not instant), 1/2 cup applesauce, and 1/2-1 cup warm prune juice every day If needed, you may take Colace (docusate sodium ) stool softener once or twice a day to help keep the stool soft.  If you still are having problems with constipation, you may take Miralax  once daily as needed to help keep your bowels regular.   Home Blood Pressure Monitoring for  Patients   Your provider has recommended that you check your blood pressure (BP) at least once a week at home. If you do not have a blood pressure cuff at home, one will be provided for you. Contact your provider if you have not received your monitor within 1 week.   Helpful Tips for Accurate Home Blood Pressure Checks  Don't smoke, exercise, or drink caffeine 30 minutes before checking your BP Use the restroom before checking your BP (a full bladder can raise your pressure) Relax in a comfortable upright chair Feet on the ground Left arm resting comfortably on a flat surface at the level of your heart Legs uncrossed Back supported Sit quietly and don't talk Place the cuff on your bare arm Adjust snuggly, so that only two fingertips can fit between your skin and the top of the cuff Check 2 readings separated by at least one minute Keep a log of your BP readings For a visual, please reference this diagram: http://ccnc.care/bpdiagram  Provider Name: [facility]     Phone: [phone]  Zone 1: ALL CLEAR  Continue to monitor your symptoms:  BP reading is less than 140 (top number) or less than 90 (bottom number)  No right upper stomach pain No headaches or seeing spots No feeling nauseated or throwing up No swelling in face and hands  Zone 2: CAUTION Call your doctor's office for any of the following:  BP reading is greater than 140 (top number) or greater than 90 (bottom number)  Stomach pain under your ribs in the middle or right side Headaches or seeing spots Feeling nauseated or throwing up Swelling in face and hands  Zone 3: EMERGENCY  Seek immediate medical care if you have any of the following:  BP reading is greater than160 (top number) or greater than 110 (bottom number) Severe headaches not improving with Tylenol  Serious difficulty catching your breath Any worsening symptoms from Zone 2    First Trimester of Pregnancy The first trimester of pregnancy is from  week 1 until the end of week 12 (months 1 through 3). A week after a sperm fertilizes an egg, the egg will implant on the wall of the uterus. This embryo will begin to develop into a baby. Genes from you and your partner are forming the baby. The female genes determine whether the baby is a boy or a girl. At 6-8 weeks, the eyes and face are formed, and the heartbeat can be seen on ultrasound. At the end of 12 weeks, all the baby's organs are formed.  Now that you are pregnant, you will want to do everything you can to have a healthy baby. Two of the most important things are to get good prenatal care and to follow your health care provider's instructions. Prenatal care is all the medical care you receive before the baby's birth. This care will help prevent, find, and treat any problems during the pregnancy and childbirth. BODY CHANGES Your body goes through many changes during pregnancy. The changes vary from woman to woman.  You may gain or lose a couple of pounds at first. You may feel sick to your stomach (nauseous) and throw up (vomit). If the vomiting is uncontrollable, call your health care provider. You may tire easily. You may develop headaches that can be relieved by medicines approved by your health care provider. You may urinate more often. Painful urination may mean you have a bladder infection. You may develop heartburn as a result of your pregnancy. You may develop constipation because certain hormones are causing the muscles that push waste through your intestines to slow down. You may develop hemorrhoids or swollen, bulging veins (varicose veins). Your breasts may begin to grow larger and become tender. Your nipples may stick out more, and the tissue that surrounds them (areola) may become darker. Your gums may bleed and may be sensitive to brushing and flossing. Dark spots or blotches (chloasma, mask of pregnancy) may develop on your face. This will likely fade after the baby is  born. Your menstrual periods will stop. You may have a loss of appetite. You may develop cravings for certain kinds of food. You may have changes in your emotions from day to day, such as being excited to be pregnant or being concerned that something may go wrong with the pregnancy and baby. You may have more vivid and strange dreams. You may have changes in your hair. These can include thickening of your hair, rapid growth, and changes in texture. Some women also have hair loss during or after pregnancy, or hair that feels dry or thin. Your hair will most likely return to normal after your baby is born. WHAT TO EXPECT AT YOUR PRENATAL VISITS During a routine prenatal visit: You will be weighed to make sure you and the baby are growing normally. Your blood pressure will be taken. Your abdomen will be measured to track your baby's growth. The fetal  heartbeat will be listened to starting around week 10 or 12 of your pregnancy. Test results from any previous visits will be discussed. Your health care provider may ask you: How you are feeling. If you are feeling the baby move. If you have had any abnormal symptoms, such as leaking fluid, bleeding, severe headaches, or abdominal cramping. If you have any questions. Other tests that may be performed during your first trimester include: Blood tests to find your blood type and to check for the presence of any previous infections. They will also be used to check for low iron levels (anemia) and Rh antibodies. Later in the pregnancy, blood tests for diabetes will be done along with other tests if problems develop. Urine tests to check for infections, diabetes, or protein in the urine. An ultrasound to confirm the proper growth and development of the baby. An amniocentesis to check for possible genetic problems. Fetal screens for spina bifida and Down syndrome. You may need other tests to make sure you and the baby are doing well. HOME CARE  INSTRUCTIONS  Medicines Follow your health care provider's instructions regarding medicine use. Specific medicines may be either safe or unsafe to take during pregnancy. Take your prenatal vitamins as directed. If you develop constipation, try taking a stool softener if your health care provider approves. Diet Eat regular, well-balanced meals. Choose a variety of foods, such as meat or vegetable-based protein, fish, milk and low-fat dairy products, vegetables, fruits, and whole grain breads and cereals. Your health care provider will help you determine the amount of weight gain that is right for you. Avoid raw meat and uncooked cheese. These carry germs that can cause birth defects in the baby. Eating four or five small meals rather than three large meals a day may help relieve nausea and vomiting. If you start to feel nauseous, eating a few soda crackers can be helpful. Drinking liquids between meals instead of during meals also seems to help nausea and vomiting. If you develop constipation, eat more high-fiber foods, such as fresh vegetables or fruit and whole grains. Drink enough fluids to keep your urine clear or pale yellow. Activity and Exercise Exercise only as directed by your health care provider. Exercising will help you: Control your weight. Stay in shape. Be prepared for labor and delivery. Experiencing pain or cramping in the lower abdomen or low back is a good sign that you should stop exercising. Check with your health care provider before continuing normal exercises. Try to avoid standing for long periods of time. Move your legs often if you must stand in one place for a long time. Avoid heavy lifting. Wear low-heeled shoes, and practice good posture. You may continue to have sex unless your health care provider directs you otherwise. Relief of Pain or Discomfort Wear a good support bra for breast tenderness.   Take warm sitz baths to soothe any pain or discomfort caused by  hemorrhoids. Use hemorrhoid cream if your health care provider approves.   Rest with your legs elevated if you have leg cramps or low back pain. If you develop varicose veins in your legs, wear support hose. Elevate your feet for 15 minutes, 3-4 times a day. Limit salt in your diet. Prenatal Care Schedule your prenatal visits by the twelfth week of pregnancy. They are usually scheduled monthly at first, then more often in the last 2 months before delivery. Write down your questions. Take them to your prenatal visits. Keep all your prenatal visits as directed  by your health care provider. Safety Wear your seat belt at all times when driving. Make a list of emergency phone numbers, including numbers for family, friends, the hospital, and police and fire departments. General Tips Ask your health care provider for a referral to a local prenatal education class. Begin classes no later than at the beginning of month 6 of your pregnancy. Ask for help if you have counseling or nutritional needs during pregnancy. Your health care provider can offer advice or refer you to specialists for help with various needs. Do not use hot tubs, steam rooms, or saunas. Do not douche or use tampons or scented sanitary pads. Do not cross your legs for long periods of time. Avoid cat litter boxes and soil used by cats. These carry germs that can cause birth defects in the baby and possibly loss of the fetus by miscarriage or stillbirth. Avoid all smoking, herbs, alcohol, and medicines not prescribed by your health care provider. Chemicals in these affect the formation and growth of the baby. Schedule a dentist appointment. At home, brush your teeth with a soft toothbrush and be gentle when you floss. SEEK MEDICAL CARE IF:  You have dizziness. You have mild pelvic cramps, pelvic pressure, or nagging pain in the abdominal area. You have persistent nausea, vomiting, or diarrhea. You have a bad smelling vaginal  discharge. You have pain with urination. You notice increased swelling in your face, hands, legs, or ankles. SEEK IMMEDIATE MEDICAL CARE IF:  You have a fever. You are leaking fluid from your vagina. You have spotting or bleeding from your vagina. You have severe abdominal cramping or pain. You have rapid weight gain or loss. You vomit blood or material that looks like coffee grounds. You are exposed to German measles and have never had them. You are exposed to fifth disease or chickenpox. You develop a severe headache. You have shortness of breath. You have any kind of trauma, such as from a fall or a car accident. Document Released: 02/08/2001 Document Revised: 07/01/2013 Document Reviewed: 12/25/2012 [facility] Patient Information 2015 [place], [place]. This information is not intended to replace advice given to you by your health care provider. Make sure you discuss any questions you have with your health care provider.  ADDITIONAL HEALTHCARE OPTIONS FOR PATIENTS  [facility] / e-Visit: [email or web address]         [facility]: [phone]  [facility]: [phone]                   [facility]: [phone]     Safe Medications in Pregnancy   Acne: Benzoyl Peroxide Salicylic Acid  Backache/Headache: Tylenol : 2 regular strength every 4 hours OR              2 Extra strength every 6 hours  Colds/Coughs/Allergies: Benadryl  (alcohol free) 25 mg every 6 hours as needed Breath right strips Claritin Cepacol throat lozenges Chloraseptic throat spray Cold-Eeze- up to three times per day Cough drops, alcohol free Flonase (by prescription only) Guaifenesin Mucinex Robitussin DM (plain only, alcohol free) Saline nasal spray/drops Sudafed (pseudoephedrine) & Actifed ** use only after [redacted] weeks gestation and if you do not have high blood pressure Tylenol  Vicks Vaporub Zinc  lozenges Zyrtec   Constipation: Colace Ducolax suppositories Fleet enema Glycerin  suppositories Metamucil Milk of magnesia Miralax  Senokot Smooth move tea  Diarrhea: Kaopectate Imodium A-D  *NO pepto Bismol  Hemorrhoids: Anusol  Anusol  HC Preparation H Tucks  Indigestion: Tums Maalox  Mylanta Zantac  Pepcid   Insomnia: Benadryl  (alcohol free) 25mg  every 6 hours as needed Tylenol  PM Unisom , no Gelcaps  Leg Cramps: Tums MagGel  Nausea/Vomiting:  Bonine Dramamine Emetrol Ginger extract Sea bands Meclizine  Nausea medication to take during pregnancy:  Unisom  (doxylamine  succinate 25 mg tablets) Take one tablet daily at bedtime. If symptoms are not adequately controlled, the dose can be increased to a maximum recommended dose of two tablets daily (1/2 tablet in the morning, 1/2 tablet mid-afternoon and one at bedtime). Vitamin B6 100mg  tablets. Take one tablet twice a day (up to 200 mg per day).  Skin Rashes: Aveeno products Benadryl  cream or 25mg  every 6 hours as needed Calamine Lotion 1% cortisone cream  Yeast infection: Gyne-lotrimin 7 Monistat 7   **If taking multiple medications, please check labels to avoid duplicating the same active ingredients **take medication as directed on the label ** Do not exceed 4000 mg of tylenol  in 24 hours **Do not take medications that contain aspirin  or ibuprofen    ([phone] is the phone number for Pregnancy Classes or hospital tours at [facility].   You will be referred to  [email or web address]   for more information on childbirth classes   At this site you may register for classes. You may sign up for a waiting list if classes are full. Please SIGN UP FOR THIS!.   When the waiting list becomes long, sometimes new classes can be added.  [facility] Call to Register: [phone] or  [phone]   or   Register Online: [email or web address] THESE CLASSES FILL UP VERY QUICKLY, SO SIGN UP AS SOON AS YOU CAN!!! Please visit [facility] pregnancy website at [email or web address]  Childbirth Classes  Option 1: Birth & Baby Series Series of 3 weekly classes, on the same day of the week (can choose Mon-Thurs) from 6-9pm Helps you and your support person prepare for childbirth Reviews newborn care, labor & birth, cesarean birth, pain management, and comfort techniques Cost: $60 per couple for insured or self-pay, $30 per couple for Medicaid  Option 2: Weekend Birth & Baby This class is a weekend version of our Birth & Baby series.  It is designed for parents who have a difficult time fitting several weeks of classes into their schedule.   Covers the care of your newborn and the basics of labor and childbirth Friday 6:30pm-8:30pm Saturday 9am-4pm, includes lunch for you and your partner  Cost: $75 per couple for insured or self-pay, $30 per couple for Medicaid  Option 3: Natural Childbirth This series of 5 weekly classes is for expectant parents who want to learn and practice natural methods of coping with the process of labor and childbirth.  Can choose Mon or Tues, 7-9pm.   Covers relaxation, breathing, massage, visualization, role of the partner, and helpful positioning Participants learn how to be confident in their body's ability to give birth. Class empowers and helps parents make informed decisions about care. Includes discussion that will help new parents transition into the immediate postpartum period.  Cost: $75 per couple for insured or self-pay, $30 per couple for Medicaid  Option 4: Online Birth & Baby This online class offers you the freedom to complete a Birth & Baby series in the comfort of your own home.  The flexibility of this option allows you to review sections at your own pace, at times convenient to you and your support people.  It includes additional  video information, animations, quizzes and extended activities. Get  organized with helpful eClass tools, checklists, and trackers.  Cost: $60 for 60 days of online access                                                                            Other Available Classes  Baby & Me Enjoy this time to discuss newborn & infant parenting topics and family adjustment issues with other new mothers in a relaxed environment. Each week brings a new speaker or baby-centered activity. We encourage mothers and their babies (birth to crawling) to join us . You are welcome to visit this group even if you haven't delivered yet! It's wonderful to make new friends early and watch other moms interact with their babies. No registration or fee.  Big Brother/Big Sister Let your children share in the joy of a new brother or sister in this special class designed just for them. Discussion includes how families care for babies: swaddling, holding, diapering, safety, as well as how they can be helpful in their new role. This class is designed for children ages 2 to 63, but any age is welcome. Please register each child individually. $5 Breastfeeding Support Group This group is a mother-to-mother support circle where moms have the opportunity to share their breastfeeding experiences. A Breastfeeding Support nurse is present for questions and concerns. An infant scale is available for weight checks. No fee or registration.  Breastfeeding Your Baby Breastfeeding is a special time for mother and child. This class will help you feel ready to begin this important relationship. Your partner is encouraged to attend with you. Learn what to expect and feel more confident in the first days of breastfeeding your newborn. This class also addresses the most common fears and challenges of breastfeeding during the first few weeks, months, and beyond. $30 per couple Caring for Baby This class is for expectant and adoptive parents who want to  learn and practice the most up-to-date newborn care for their babies. Focus is on birth through first six weeks of life. Topics include feeding, bathing, diapering, crying, umbilical cord care, circumcision care and safe sleep. Parents learn how to recognize symptoms of illness and when to call the pediatrician. Register only the mom-to-be and your partner can plan to come with you. (*Note: This class is included in the Birth & Baby series and the Weekend Birth & Baby classes.) $10 per couple Comfort Techniques & Tour This 2-hour interactive class is designed for those who either do not wish to take the Birth & Baby series or for those who prefer our online childbirth class, but don't want to miss the opportunity to learn and practice hands-on techniques. These skills can help relieve some of the discomfort of labor and encourage your baby to rotate toward the best position for birth. You and your partner will be able to try a variety of labor positions with birth balls and rebozos as well as practice breathing, relaxation, and visual techniques. $20 per couple [employer] [facility] This course offers Dads-to-be the tools and knowledge needed to feel confident on their journey to becoming new fathers. Experienced dads, who have been trained as coaches, teach dads-to-be how to hold, comfort, diapers, swaddle and play with their infant while being  able to support the new mom as well. $25 Grandparent Love Expecting a grandbaby? Learn about the latest infant care and safety recommendations and ways to support your own child as he or she transitions into the parenting role. $10 per person Infant and Child CPR Parents, grandparents, babysitters, and friends learn Cardio-Pulmonary Resuscitation skills for infants and children. You will also learn how to treat both conscious and unconscious choking infants and children. Register each participant individually. (Note: This Family & Friends program does not offer  certification.) $20 per person Marvelous Multiples Expecting twins, triplets, or more? This free 2-hour class covers the differences in labor, birth, parenting, and breastfeeding issues that face multiples' parents.  Maternity Care Center Virtual Tour  Online virtual tour of the new [facility] Talk This free mom-led group offers support and connection to mothers as they journey through the adjustments and struggles of that sometimes overwhelming first year after the birth of a child. A member of our staff will be present to share resources and additional support if needed, as you care for yourself and baby. You are welcome to visit this group before you deliver! It's wonderful to meet new friends early and watch other moms interact with their babies.  Waterbirth Class Interested in a waterbirth? This free informational class will help you discover whether waterbirth is the right fit for you and is required if you are planning a waterbirth. Education about waterbirth itself, supplies you may need, and what you may need from your support team is included in this class. Partners are encouraged to come.

## 2023-03-09 NOTE — Progress Notes (Signed)
 INITIAL OBSTETRICAL VISIT Patient name: Cheyenne Bauer MRN [id number]  Date of birth: 1991/02/24 Chief Complaint:   Initial Prenatal Visit  History of Present Illness:   Cheyenne Bauer is a 33 y.o. [id number] Caucasian female at [redacted]w[redacted]d by US  at 7 weeks with an Estimated Date of Delivery: 09/21/23 being seen today for her initial obstetrical visit.   Her obstetrical history is significant for Term SVD X2, GHTN with both.   Today she reports bonjesta  is helping a lot w/vomiting .     03/09/2023    3:05 PM 05/03/2022   11:27 AM 04/23/2021    9:10 AM 12/29/2020    3:19 PM 07/20/2020   11:39 AM  Depression screen PHQ 2/9  Decreased Interest 1 1 0 1 1  Down, Depressed, Hopeless 1 1 0 1 1  PHQ - 2 Score 2 2 0 2 2  Altered sleeping 1 0 1 1 1   Tired, decreased energy 1 1 1 1 1   Change in appetite 0 3 0 1 1  Feeling bad or failure about yourself  1 2 0 1 1  Trouble concentrating 1 1 1 1 1   Moving slowly or fidgety/restless 1 0 0 1 1  Suicidal thoughts 0 1 0 1 1  PHQ-9 Score 7 10 3 9 9     Patient's last menstrual period was 12/15/2022. Last pap  Diagnosis  Date Value Ref Range Status  07/20/2020   Final   - Negative for intraepithelial lesion or malignancy (NILM)   Review of Systems:   Pertinent items are noted in HPI Denies cramping/contractions, leakage of fluid, vaginal bleeding, abnormal vaginal discharge w/ itching/odor/irritation, headaches, visual changes, shortness of breath, chest pain, abdominal pain, severe nausea/vomiting, or problems with urination or bowel movements unless otherwise stated above.  Pertinent History Reviewed:  Reviewed past medical,surgical, social, obstetrical and family history.  Reviewed problem list, medications and allergies. OB History  Gravida Para Term Preterm AB Living  5 2 2  2 2   SAB IAB Ectopic Multiple Live Births  1 1  0 2    # Outcome Date GA Lbr Len/2nd Weight Sex Type Anes PTL Lv  5 Current           4 Term 06/30/21 [redacted]w[redacted]d / 01:05 7 lb 13 oz  (3.544 kg) M Vag-Spont EPI  LIV     Birth Comments: WDL     Complications: Gestational hypertension  3 Term 12/08/18 [redacted]w[redacted]d 08:15 / 00:41 7 lb 6 oz (3.345 kg) M Vag-Spont EPI N LIV     Birth Comments: none     Complications: Gestational hypertension  2 SAB 01/28/18          1 IAB            Physical Assessment:  There were no vitals filed for this visit.There is no height or weight on file to calculate BMI.       Physical Examination:  General appearance - well appearing, and in no distress  Mental status - alert, oriented to person, place, and time  Psych:  She has a normal mood and affect  Skin - warm and dry, normal color, no suspicious lesions noted  Chest - effort normal  Heart - normal rate and regular rhythm  Abdomen - soft, nontender  Extremities:  No swelling or varicosities noted  TODAY'S NT   US  12 wks,CRL 67.98 mm,NB present,NT 1.5 mm,posterior placenta,normal ovaries,FHR 150 bpm    No results found for this or any  previous visit (from the past 24 hours).   Indications for ASA therapy (per uptodate) One of the following: Previous pregnancy with preeclampsia, especially early onset and with an adverse outcome Yes     03/09/2023    3:05 PM 05/03/2022   11:27 AM 04/23/2021    9:10 AM 12/29/2020    3:19 PM 07/20/2020   11:39 AM  Depression screen PHQ 2/9  Decreased Interest 1 1 0 1 1  Down, Depressed, Hopeless 1 1 0 1 1  PHQ - 2 Score 2 2 0 2 2  Altered sleeping 1 0 1 1 1   Tired, decreased energy 1 1 1 1 1   Change in appetite 0 3 0 1 1  Feeling bad or failure about yourself  1 2 0 1 1  Trouble concentrating 1 1 1 1 1   Moving slowly or fidgety/restless 1 0 0 1 1  Suicidal thoughts 0 1 0 1 1  PHQ-9 Score 7 10 3 9 9         03/09/2023    3:05 PM 05/03/2022   11:29 AM 04/23/2021    9:11 AM 12/29/2020    3:19 PM  GAD 7 : Generalized Anxiety Score  Nervous, Anxious, on Edge 1 3 0 1  Control/stop worrying 1 3 0 1  Worry too much - different things 1 3 1 1   Trouble  relaxing 1 3 0 1  Restless 1 2 0 1  Easily annoyed or irritable 1 3 1 1   Afraid - awful might happen 1 0 0 1  Total GAD 7 Score 7 17 2 7       Assessment & Plan:  1) Low-Risk Pregnancy [id number] at [redacted]w[redacted]d with an Estimated Date of Delivery: 09/21/23   2) Initial OB visit    1. History of gestational hypertension (Primary)  Already on ASA - Protein / creatinine ratio, urine - Comprehensive metabolic panel  2. [redacted] weeks gestation of pregnancy  - Integrated 1 - Urine Culture - Hemoglobin A1c - PANORAMA PRENATAL TEST - Protein / creatinine ratio, urine - Comprehensive metabolic panel - CBC/D/Plt+RPR+Rh+ABO+RubIgG... - CHL AMB BABYSCRIPTS SCHEDULE OPTIMIZATION - GC/Chlamydia Probe Amp  3. Supervision of other normal pregnancy, antepartum  - Integrated 1 - Urine Culture - Hemoglobin A1c - PANORAMA PRENATAL TEST - Protein / creatinine ratio, urine - Comprehensive metabolic panel - CBC/D/Plt+RPR+Rh+ABO+RubIgG... - CHL AMB BABYSCRIPTS SCHEDULE OPTIMIZATION - GC/Chlamydia Probe Amp  4. Diabetes mellitus screening  - Hemoglobin A1c       Meds:  Meds ordered this encounter  Medications   Doxylamine -Pyridoxine  (DICLEGIS ) 10-10 MG TBEC    Sig: Take 2 qhs; may also take one in am and one in afternoon prn nausea    Dispense:  60 tablet    Refill:  6    Supervising Provider:   JAYNE VONN DEL [[id number]]   aspirin  EC 81 MG tablet    Sig: Take 2 tablets (162 mg total) by mouth daily.    Dispense:  60 tablet    Refill:  6    Supervising Provider:   JAYNE VONN DEL [[id number]]    Initial labs obtained Continue prenatal vitamins Reviewed n/v relief measures and warning s/s to report Reviewed recommended weight gain based on pre-gravid BMI Encouraged well-balanced diet Genetic & carrier screening discussed: requests Panorama and NT/IT, declines AFP Ultrasound discussed; fetal survey: requested CCNC completed> form faxed if has or is planning to apply for medicaid The nature of  [facility] [employer] with  multiple MDs and other Advanced Practice Providers was explained to patient; also emphasized that fellows, residents, and students are part of our team. Has home bp cuff.. Check bp weekly, let us  know if >140/90.        Cathlean Cresenzo-Dishmon 4:00 PM

## 2023-03-10 LAB — INTEGRATED 1

## 2023-03-11 LAB — CBC/D/PLT+RPR+RH+ABO+RUBIGG...
Antibody Screen: NEGATIVE
Basophils Absolute: 0 10*3/uL (ref 0.0–0.2)
Basos: 1 %
EOS (ABSOLUTE): 0.1 10*3/uL (ref 0.0–0.4)
Eos: 1 %
HIV Screen 4th Generation wRfx: NONREACTIVE
Hematocrit: 38.6 % (ref 34.0–46.6)
Hemoglobin: 12.9 g/dL (ref 11.1–15.9)
Immature Grans (Abs): 0 10*3/uL (ref 0.0–0.1)
Immature Granulocytes: 1 %
Lymphocytes Absolute: 1.9 10*3/uL (ref 0.7–3.1)
Lymphs: 22 %
MCH: 30.8 pg (ref 26.6–33.0)
MCHC: 33.4 g/dL (ref 31.5–35.7)
MCV: 92 fL (ref 79–97)
Monocytes Absolute: 0.4 10*3/uL (ref 0.1–0.9)
Monocytes: 4 %
Neutrophils Absolute: 5.9 10*3/uL (ref 1.4–7.0)
Neutrophils: 71 %
Platelets: 358 10*3/uL (ref 150–450)
RBC: 4.19 x10E6/uL (ref 3.77–5.28)
RDW: 12.1 % (ref 11.7–15.4)
RPR Ser Ql: NONREACTIVE
Rh Factor: POSITIVE
Rubella Antibodies, IGG: <0.9 {index} — ABNORMAL LOW (ref >0.99)
WBC: 8.3 10*3/uL (ref 3.4–10.8)

## 2023-03-11 LAB — COMPREHENSIVE METABOLIC PANEL
ALT: 17 IU/L (ref 0–32)
AST: 15 IU/L (ref 0–40)
Albumin: 3.9 g/dL — ABNORMAL LOW (ref 4.0–5.0)
Alkaline Phosphatase: 73 IU/L (ref 44–121)
BUN/Creatinine Ratio: 19 (ref 9–23)
BUN: 11 mg/dL (ref 6–20)
CO2: 22 mmol/L (ref 20–29)
Calcium: 9.3 mg/dL (ref 8.7–10.2)
Chloride: 103 mmol/L (ref 96–106)
Creatinine, Ser: 0.59 mg/dL (ref 0.57–1.00)
Globulin, Total: 2.8 g/dL (ref 1.5–4.5)
Glucose: 83 mg/dL (ref 70–99)
Potassium: 4 mmol/L (ref 3.5–5.2)
Sodium: 138 mmol/L (ref 134–144)
Total Protein: 6.7 g/dL (ref 6.0–8.5)
eGFR: 124 mL/min/{1.73_m2} (ref >59)

## 2023-03-11 LAB — INTEGRATED 1
Crown Rump Length: 67.9 mm
Gest. Age on Collection Date: 12.9 wk
PAPP-A Value: 1011.5 ng/mL
Race: 1
Sonographer ID#: 309760
Sonographer ID#: 31.4 a
Weight: 1.5 mm
Weight: 164 [lb_av]

## 2023-03-11 LAB — HEMOGLOBIN A1C
Est. average glucose Bld gHb Est-mCnc: 108 mg/dL
Hgb A1c MFr Bld: 5.4 % (ref 4.8–5.6)

## 2023-03-11 LAB — PROTEIN / CREATININE RATIO, URINE
Creatinine, Urine: 94.9 mg/dL
Protein, Ur: 6.3 mg/dL
Protein/Creat Ratio: 66 mg/g{creat} (ref 0–200)

## 2023-03-11 LAB — GC/CHLAMYDIA PROBE AMP
Chlamydia trachomatis, NAA: NEGATIVE
Neisseria Gonorrhoeae by PCR: NEGATIVE

## 2023-03-11 LAB — HCV INTERPRETATION

## 2023-03-12 LAB — URINE CULTURE: Organism ID, Bacteria: NO GROWTH

## 2023-03-19 LAB — PANORAMA PRENATAL TEST FULL PANEL:PANORAMA TEST PLUS 5 ADDITIONAL MICRODELETIONS: FETAL FRACTION: 5.6

## 2023-04-24 ENCOUNTER — Other Ambulatory Visit: Payer: Self-pay | Admitting: Advanced Practice Midwife

## 2023-04-24 DIAGNOSIS — Z363 Encounter for antenatal screening for malformations: Secondary | ICD-10-CM

## 2023-04-24 DIAGNOSIS — Z8759 Personal history of other complications of pregnancy, childbirth and the puerperium: Secondary | ICD-10-CM

## 2023-04-24 DIAGNOSIS — Z131 Encounter for screening for diabetes mellitus: Secondary | ICD-10-CM

## 2023-04-24 DIAGNOSIS — Z3480 Encounter for supervision of other normal pregnancy, unspecified trimester: Secondary | ICD-10-CM

## 2023-04-24 DIAGNOSIS — Z3A12 12 weeks gestation of pregnancy: Secondary | ICD-10-CM

## 2023-04-25 ENCOUNTER — Ambulatory Visit (INDEPENDENT_AMBULATORY_CARE_PROVIDER_SITE_OTHER): Payer: Managed Care, Other (non HMO) | Admitting: Women's Health

## 2023-04-25 ENCOUNTER — Encounter: Payer: Self-pay | Admitting: Women's Health

## 2023-04-25 ENCOUNTER — Ambulatory Visit: Payer: Managed Care, Other (non HMO) | Admitting: Radiology

## 2023-04-25 VITALS — BP 128/72 | HR 89 | Wt 183.0 lb

## 2023-04-25 DIAGNOSIS — Z3A18 18 weeks gestation of pregnancy: Secondary | ICD-10-CM

## 2023-04-25 DIAGNOSIS — Z8759 Personal history of other complications of pregnancy, childbirth and the puerperium: Secondary | ICD-10-CM | POA: Diagnosis not present

## 2023-04-25 DIAGNOSIS — O26892 Other specified pregnancy related conditions, second trimester: Secondary | ICD-10-CM

## 2023-04-25 DIAGNOSIS — Z363 Encounter for antenatal screening for malformations: Secondary | ICD-10-CM | POA: Diagnosis not present

## 2023-04-25 DIAGNOSIS — R519 Headache, unspecified: Secondary | ICD-10-CM | POA: Diagnosis not present

## 2023-04-25 DIAGNOSIS — Z3482 Encounter for supervision of other normal pregnancy, second trimester: Secondary | ICD-10-CM

## 2023-04-25 DIAGNOSIS — Z3480 Encounter for supervision of other normal pregnancy, unspecified trimester: Secondary | ICD-10-CM

## 2023-04-25 NOTE — Patient Instructions (Addendum)
 Cheyenne Bauer, thank you for choosing our office today! We appreciate the opportunity to meet your healthcare needs. You may receive a short survey by mail, e-mail, or through [employer]. If you are happy with your care we would appreciate if you could take just a few minutes to complete the survey questions. We read all of your comments and take your feedback very seriously. Thank you again for choosing our office.  Center for [employer] Team at [facility] at [facility] ([address] [place], [place] [address]) Entrance C, located off of [address] Free 24/7 valet parking  Go to [email or web address] to register for FREE online childbirth classes  Call the office [phone]) or go to [facility] if: You begin to severe cramping Your water breaks.  Sometimes it is a big gush of fluid, sometimes it is just a trickle that keeps getting your panties wet or running down your legs You have vaginal bleeding.  It is normal to have a small amount of spotting if your cervix was checked.   [facility] Pediatricians/Family Doctors [facility]): [address] Colette Ribas, [phone]           [facility]: [address], [phone]                [facility]): [address], [phone] (call to ask if accepting patients) [facility] Department: [address], [place], [phone]    [facility] Pediatricians/Family Doctors [facility]): [phone], [phone] [facility]: [address] [place], [phone] [facility]: [address], [phone]  [facility] Doctors  [facility]): [phone] [facility]: [address] [phone]   [facility] Doctors [facility]: [address], [phone]  [facility] Doctors  [facility]: [phone], [phone]  Home Blood Pressure Monitoring for Patients   Your provider has recommended that you check your blood pressure (BP) at least once a week at home. If you do not have a blood pressure cuff at home, one will be provided for you. Contact your provider if you have not received your monitor within 1 week.   Helpful Tips for Accurate Home Blood Pressure Checks  Don't smoke, exercise, or drink caffeine 30 minutes before checking your BP Use the restroom before checking your BP (a full bladder can raise your pressure) Relax in a comfortable upright chair Feet on the ground Left arm resting comfortably on a flat surface at the level of your heart Legs uncrossed Back supported Sit quietly and don't talk Place the cuff on your bare arm Adjust snuggly, so that only two fingertips can fit between your skin and the top of the cuff Check 2 readings separated by at least one minute Keep a log of your BP readings For a visual, please reference this diagram: http://ccnc.care/bpdiagram  Provider Name: [facility]     Phone: [phone]  Zone 1: ALL CLEAR  Continue to monitor your symptoms:  BP reading is less than 140 (top number) or less than 90 (bottom number)  No right upper stomach pain No headaches or seeing spots No feeling nauseated or throwing up No swelling in face and hands  Zone 2: CAUTION Call your doctor's office for any of the following:  BP reading is greater than 140 (top number) or greater than  90 (bottom number)  Stomach pain under your ribs in the middle or right side Headaches or seeing spots Feeling nauseated or throwing up Swelling in face and hands  Zone 3: EMERGENCY  Seek immediate medical care if you have any of the following:  BP reading is greater than160 (top number) or greater than 110 (bottom number) Severe headaches not improving with Tylenol Serious difficulty catching your breath Any worsening symptoms from Zone 2      Second Trimester of Pregnancy The second trimester is from week 14 through week 27 (months 4 through 6). The second trimester is often a time when you feel your best. Your body has adjusted to being pregnant, and you begin to feel better physically. Usually, morning sickness has lessened or quit completely, you may have more energy, and you may have an increase in appetite. The second trimester is also a time when the fetus is growing rapidly. At the end of the sixth month, the fetus is about 9 inches long and weighs about 1 pounds. You will likely begin to feel the baby move (quickening) between 16 and 20 weeks of pregnancy. Body changes during your second trimester Your body continues to go through many changes during your second trimester. The changes vary from woman to woman. Your weight will continue to increase. You will notice your lower abdomen bulging out. You may begin to get stretch marks on your hips, abdomen, and breasts. You may develop headaches that can be relieved by medicines. The medicines should be approved by your health care provider. You may urinate more often because the fetus is pressing on your bladder. You may develop or continue to have heartburn as a result of your pregnancy. You may develop constipation because certain hormones are causing the muscles that push waste through your intestines to slow down. You may develop hemorrhoids or swollen, bulging veins (varicose veins). You may have back pain. This is caused by: Weight gain. Pregnancy hormones that are relaxing the joints in your pelvis. A shift in weight and the muscles that support your balance. Your breasts will continue to grow and they will continue to become tender. Your gums may bleed and may be sensitive to brushing and flossing. Dark spots or blotches (chloasma, mask of pregnancy) may develop on your face. This will likely fade after the baby is born. A dark line from your belly button to the  pubic area (linea nigra) may appear. This will likely fade after the baby is born. You may have changes in your hair. These can include thickening of your hair, rapid growth, and changes in texture. Some women also have hair loss during or after pregnancy, or hair that feels dry or thin. Your hair will most likely return to normal after your baby is born.  What to expect at prenatal visits During a routine prenatal visit: You will be weighed to make sure you and the fetus are growing normally. Your blood pressure will be taken. Your abdomen will be measured to track your baby's growth. The fetal heartbeat will be listened to. Any test results from the previous visit will be discussed.  Your health care provider may ask you: How you are feeling. If you are feeling the baby move. If you have had any abnormal symptoms, such as leaking fluid, bleeding, severe headaches, or abdominal cramping. If you are using any tobacco products, including cigarettes, chewing tobacco, and electronic cigarettes. If you have any questions.  Other tests that may be performed during  your second trimester include: Blood tests that check for: Low iron levels (anemia). High blood sugar that affects pregnant women (gestational diabetes) between 35 and 28 weeks. Rh antibodies. This is to check for a protein on red blood cells (Rh factor). Urine tests to check for infections, diabetes, or protein in the urine. An ultrasound to confirm the proper growth and development of the baby. An amniocentesis to check for possible genetic problems. Fetal screens for spina bifida and Down syndrome. HIV (human immunodeficiency virus) testing. Routine prenatal testing includes screening for HIV, unless you choose not to have this test.  Follow these instructions at home: Medicines Follow your health care provider's instructions regarding medicine use. Specific medicines may be either safe or unsafe to take during pregnancy. Take  a prenatal vitamin that contains at least 600 micrograms (mcg) of folic acid. If you develop constipation, try taking a stool softener if your health care provider approves. Eating and drinking Eat a balanced diet that includes fresh fruits and vegetables, whole grains, good sources of protein such as meat, eggs, or tofu, and low-fat dairy. Your health care provider will help you determine the amount of weight gain that is right for you. Avoid raw meat and uncooked cheese. These carry germs that can cause birth defects in the baby. If you have low calcium intake from food, talk to your health care provider about whether you should take a daily calcium supplement. Limit foods that are high in fat and processed sugars, such as fried and sweet foods. To prevent constipation: Drink enough fluid to keep your urine clear or pale yellow. Eat foods that are high in fiber, such as fresh fruits and vegetables, whole grains, and beans. Activity Exercise only as directed by your health care provider. Most women can continue their usual exercise routine during pregnancy. Try to exercise for 30 minutes at least 5 days a week. Stop exercising if you experience uterine contractions. Avoid heavy lifting, wear low heel shoes, and practice good posture. A sexual relationship may be continued unless your health care provider directs you otherwise. Relieving pain and discomfort Wear a good support bra to prevent discomfort from breast tenderness. Take warm sitz baths to soothe any pain or discomfort caused by hemorrhoids. Use hemorrhoid cream if your health care provider approves. Rest with your legs elevated if you have leg cramps or low back pain. If you develop varicose veins, wear support hose. Elevate your feet for 15 minutes, 3-4 times a day. Limit salt in your diet. Prenatal Care Write down your questions. Take them to your prenatal visits. Keep all your prenatal visits as told by your health care provider.  This is important. Safety Wear your seat belt at all times when driving. Make a list of emergency phone numbers, including numbers for family, friends, the hospital, and police and fire departments. General instructions Ask your health care provider for a referral to a local prenatal education class. Begin classes no later than the beginning of month 6 of your pregnancy. Ask for help if you have counseling or nutritional needs during pregnancy. Your health care provider can offer advice or refer you to specialists for help with various needs. Do not use hot tubs, steam rooms, or saunas. Do not douche or use tampons or scented sanitary pads. Do not cross your legs for long periods of time. Avoid cat litter boxes and soil used by cats. These carry germs that can cause birth defects in the baby and possibly loss of the  fetus by miscarriage or stillbirth. Avoid all smoking, herbs, alcohol, and unprescribed drugs. Chemicals in these products can affect the formation and growth of the baby. Do not use any products that contain nicotine or tobacco, such as cigarettes and e-cigarettes. If you need help quitting, ask your health care provider. Visit your dentist if you have not gone yet during your pregnancy. Use a soft toothbrush to brush your teeth and be gentle when you floss. Contact a health care provider if: You have dizziness. You have mild pelvic cramps, pelvic pressure, or nagging pain in the abdominal area. You have persistent nausea, vomiting, or diarrhea. You have a bad smelling vaginal discharge. You have pain when you urinate. Get help right away if: You have a fever. You are leaking fluid from your vagina. You have spotting or bleeding from your vagina. You have severe abdominal cramping or pain. You have rapid weight gain or weight loss. You have shortness of breath with chest pain. You notice sudden or extreme swelling of your face, hands, ankles, feet, or legs. You have not felt  your baby move in over an hour. You have severe headaches that do not go away when you take medicine. You have vision changes. Summary The second trimester is from week 14 through week 27 (months 4 through 6). It is also a time when the fetus is growing rapidly. Your body goes through many changes during pregnancy. The changes vary from woman to woman. Avoid all smoking, herbs, alcohol, and unprescribed drugs. These chemicals affect the formation and growth your baby. Do not use any tobacco products, such as cigarettes, chewing tobacco, and e-cigarettes. If you need help quitting, ask your health care provider. Contact your health care provider if you have any questions. Keep all prenatal visits as told by your health care provider. This is important. This information is not intended to replace advice given to you by your health care provider. Make sure you discuss any questions you have with your health care provider. Document Released: 02/08/2001 Document Revised: 07/23/2015 Document Reviewed: 04/17/2012 Elsevier Interactive Patient Education  2017 [employer]   For Headaches:  Stay well hydrated, drink enough water so that your urine is clear, sometimes if you are dehydrated you can get headaches Eat small frequent meals and snacks, sometimes if you are hungry you can get headaches Sometimes you get headaches during pregnancy from the pregnancy hormones You can try tylenol (1-2 regular strength 325mg  or 1-2 extra strength 500mg ) as directed on the box. The least amount of medication that works is best.  Cool compresses (cool wet washcloth or ice pack) to area of head that is hurting You can also try drinking a caffeinated drink to see if this will help If not helping, try below:  For Prevention of Headaches/Migraines: CoQ10 100mg  three times daily Vitamin B2 400mg  daily Magnesium Oxide 400-600mg  daily  Foods to alleviate migraines:  1) dark leafy greens 2) avocado 3) tuna 4)  salmon  5) beans and legumes  Foods to avoid: 1) Excessive (or irregular timing) coffee 3) aged cheeses 4) chocolate 5) citrus fruits 6) aspartame and other artifical sweeteners 7) yeast 8) MSG (in processed foods) 9) processed and cured meats 10) nuts and certain seeds 11) chicken livers and other organ meats 12) dairy products like buttermilk, sour cream, and yogurt 13) dried fruits like dates, figs, and raisins 14) garlic 15) onions 16) potato chips 17) pickled foods like olives and sauerkraut 18) some fresh fruits like ripe banana,  papaya, red plums, raspberries, kiwi, pineapple 19) tomato-based products  Recommend to keep a migraine diary: rate daily the severity of your headache (1-10) and what foods you eat that day to help determine patterns.   If You Get a Bad Headache/Migraine: Benadryl 25mg   Magnesium Oxide 1 large Gatorade 2 extra strength Tylenol (1,000mg  total) 1 cup coffee or Coke      If this doesn't help please call us @ [phone]

## 2023-04-25 NOTE — Progress Notes (Signed)
 LOW-RISK PREGNANCY VISIT Patient name: Cheyenne Bauer MRN [id number]  Date of birth: 12-01-1990 Chief Complaint:   Routine Prenatal Visit (Anatomy scan/)  History of Present Illness:   Cheyenne Bauer is a 33 y.o. [id number] female at [redacted]w[redacted]d with an Estimated Date of Delivery: 09/21/23 being seen today for ongoing management of a low-risk pregnancy.   Today she reports  headaches, wonders if may be anemic like last pregnancy . Contractions: Not present.  .  Movement: Present. denies leaking of fluid.     03/09/2023    3:05 PM 05/03/2022   11:27 AM 04/23/2021    9:10 AM 12/29/2020    3:19 PM 07/20/2020   11:39 AM  Depression screen PHQ 2/9  Decreased Interest 1 1 0 1 1  Down, Depressed, Hopeless 1 1 0 1 1  PHQ - 2 Score 2 2 0 2 2  Altered sleeping 1 0 1 1 1   Tired, decreased energy 1 1 1 1 1   Change in appetite 0 3 0 1 1  Feeling bad or failure about yourself  1 2 0 1 1  Trouble concentrating 1 1 1 1 1   Moving slowly or fidgety/restless 1 0 0 1 1  Suicidal thoughts 0 1 0 1 1  PHQ-9 Score 7 10 3 9 9         03/09/2023    3:05 PM 05/03/2022   11:29 AM 04/23/2021    9:11 AM 12/29/2020    3:19 PM  GAD 7 : Generalized Anxiety Score  Nervous, Anxious, on Edge 1 3 0 1  Control/stop worrying 1 3 0 1  Worry too much - different things 1 3 1 1   Trouble relaxing 1 3 0 1  Restless 1 2 0 1  Easily annoyed or irritable 1 3 1 1   Afraid - awful might happen 1 0 0 1  Total GAD 7 Score 7 17 2 7       Review of Systems:   Pertinent items are noted in HPI Denies abnormal vaginal discharge w/ itching/odor/irritation, headaches, visual changes, shortness of breath, chest pain, abdominal pain, severe nausea/vomiting, or problems with urination or bowel movements unless otherwise stated above. Pertinent History Reviewed:  Reviewed past medical,surgical, social, obstetrical and family history.  Reviewed problem list, medications and allergies. Physical Assessment:   Vitals:   04/25/23 1449  BP: 128/72   Pulse: 89  Weight: 183 lb (83 kg)  Body mass index is 30.45 kg/m.        Physical Examination:   General appearance: Well appearing, and in no distress  Mental status: Alert, oriented to person, place, and time  Skin: Warm & dry  Cardiovascular: Normal heart rate noted  Respiratory: Normal respiratory effort, no distress  Abdomen: Soft, gravid, nontender  Pelvic: Cervical exam deferred         Extremities: Edema: None  Fetal Status:     Movement: Present   [place]: GA = 18+5 weeks Single active female fetus, breech, FHR = 136 bpm  MVP = 4 cm Posterior pl,  gr1,  EFW 86%  AC 77%,  normal anatomy screen - no apparent abn seen Normal ov's  -  CL = 3.8 cm,  closed  Chaperone: N/A   No results found for this or any previous visit (from the past 24 hours).  Assessment & Plan:  1) Low-risk pregnancy [id number] at [redacted]w[redacted]d with an Estimated Date of Delivery: 09/21/23   2) Headaches w/ h/o anemia, will check CBC today,  gave printed headache info  3) H/O GHTN> ASA   Meds: No orders of the defined types were placed in this encounter.  Labs/procedures today: U/S, 2nd IT, and CBC, declined flu shot  Plan:  Continue routine obstetrical care  Next visit: prefers will be in person for pn2     Reviewed: Preterm labor symptoms and general obstetric precautions including but not limited to vaginal bleeding, contractions, leaking of fluid and fetal movement were reviewed in detail with the patient.  All questions were answered. Does have home bp cuff. Office bp cuff given: not applicable. Check bp weekly, let us know if consistently >140 and/or >90.  Follow-up: Return for 8wks for pn2 & LROB w/ CNM. (Doing optimized schedule), understands she can call anytime between if appt needed. Does FHT w/ doppler and checks bp regularly at home  Future Appointments  Date Time Provider Department Center  06/20/2023  8:30 AM [facility]  06/20/2023  9:10 AM Sue Lush, FNP [facility]      Orders Placed This Encounter  Procedures   INTEGRATED 2   CBC   Cheral Marker CNM, [facility] 04/25/2023 3:19 PM

## 2023-04-25 NOTE — Progress Notes (Signed)
 [place]: GA = 18+5 weeks Single active female fetus, breech, FHR = 136 bpm  MVP = 4 cm Posterior pl,  gr1,  EFW 86%  AC 77%,  normal anatomy screen - no apparent abn seen Normal ov's  -  CL = 3.8 cm,  closed

## 2023-04-26 ENCOUNTER — Encounter: Payer: Self-pay | Admitting: Women's Health

## 2023-04-26 LAB — CBC
Hematocrit: 37.4 % (ref 34.0–46.6)
Hemoglobin: 12.3 g/dL (ref 11.1–15.9)
MCH: 29.9 pg (ref 26.6–33.0)
MCHC: 32.9 g/dL (ref 31.5–35.7)
MCV: 91 fL (ref 79–97)
Platelets: 317 10*3/uL (ref 150–450)
RBC: 4.12 x10E6/uL (ref 3.77–5.28)
RDW: 12.4 % (ref 11.7–15.4)
WBC: 9.4 10*3/uL (ref 3.4–10.8)

## 2023-04-27 ENCOUNTER — Encounter: Payer: Self-pay | Admitting: Women's Health

## 2023-04-27 LAB — INTEGRATED 2
AFP MoM: 1.26
Alpha-Fetoprotein: 59.1 ng/mL
Crown Rump Length: 67.9 mm
DIA MoM: 0.9
DIA Value: 155 pg/mL
Estriol, Unconjugated: 1.82 ng/mL
Gest. Age on Collection Date: 12.9 wk
Gestational Age: 19.6 wk
Maternal Age at EDD: 31.4 a
Nuchal Translucency (NT): 1.5 mm
Nuchal Translucency MoM: 0.79
Number of Fetuses: 1
PAPP-A MoM: 1
PAPP-A Value: 1011.5 ng/mL
Sonographer ID#: 309760
Test Results:: NEGATIVE
Weight: 164 [lb_av]
Weight: 164 [lb_av]
hCG MoM: 0.61
hCG Value: 12.9 [IU]/mL
uE3 MoM: 0.98

## 2023-05-01 ENCOUNTER — Encounter: Payer: Self-pay | Admitting: Women's Health

## 2023-06-20 ENCOUNTER — Ambulatory Visit: Payer: Managed Care, Other (non HMO) | Admitting: Obstetrics and Gynecology

## 2023-06-20 ENCOUNTER — Encounter: Payer: Self-pay | Admitting: Obstetrics and Gynecology

## 2023-06-20 ENCOUNTER — Other Ambulatory Visit: Payer: Managed Care, Other (non HMO)

## 2023-06-20 VITALS — BP 130/78 | HR 103 | Wt 198.0 lb

## 2023-06-20 DIAGNOSIS — Z3A26 26 weeks gestation of pregnancy: Secondary | ICD-10-CM | POA: Diagnosis not present

## 2023-06-20 DIAGNOSIS — O09892 Supervision of other high risk pregnancies, second trimester: Secondary | ICD-10-CM

## 2023-06-20 DIAGNOSIS — O09899 Supervision of other high risk pregnancies, unspecified trimester: Secondary | ICD-10-CM

## 2023-06-20 DIAGNOSIS — Z2839 Other underimmunization status: Secondary | ICD-10-CM

## 2023-06-20 DIAGNOSIS — Z8759 Personal history of other complications of pregnancy, childbirth and the puerperium: Secondary | ICD-10-CM

## 2023-06-20 DIAGNOSIS — Z3480 Encounter for supervision of other normal pregnancy, unspecified trimester: Secondary | ICD-10-CM

## 2023-06-20 DIAGNOSIS — Z131 Encounter for screening for diabetes mellitus: Secondary | ICD-10-CM

## 2023-06-20 NOTE — Progress Notes (Signed)
   PRENATAL VISIT NOTE  Subjective:  TIFFONY KITE is a 33 y.o. [id number] at [redacted]w[redacted]d being seen today for ongoing prenatal care.  She is currently monitored for the following issues for this low-risk pregnancy and has Depression with anxiety/BPD; Rubella non-immune status, antepartum; PLEVA (pityriasis lichenoides et varioliformis acuta); History of gestational hypertension; Anxiety and depression; and Supervision of other normal pregnancy, antepartum on their problem list.  Patient reports no complaints.  Contractions: Not present.  .  Movement: Present. Denies leaking of fluid.   The following portions of the patient's history were reviewed and updated as appropriate: allergies, current medications, past family history, past medical history, past social history, past surgical history and problem list.   Objective:   Vitals:   06/20/23 0904  BP: 130/78  Pulse: (!) 103  Weight: 198 lb (89.8 kg)    Fetal Status: Fetal Heart Rate (bpm): 152 Fundal Height: 27 cm Movement: Present     General:  Alert, oriented and cooperative. Patient is in no acute distress.  Skin: Skin is warm and dry. No rash noted.   Cardiovascular: Normal heart rate noted  Respiratory: Normal respiratory effort, no problems with respiration noted  Abdomen: Soft, gravid, appropriate for gestational age.  Pain/Pressure: Absent     Pelvic: Cervical exam deferred        Extremities: Normal range of motion.  Edema: None  Mental Status: Normal mood and affect. Normal behavior. Normal judgment and thought content.   Assessment and Plan:  Pregnancy: [id number] at [redacted]w[redacted]d 1. Supervision of other normal pregnancy, antepartum (Primary) BP and FHR normal Doing well, feeling regular movement  FH appropriate  2. [redacted] weeks gestation of pregnancy GTT and labs today  3. History of gestational hypertension Normotensive, continue ASA Continue checking bp at home   4. Rubella non-immune status, antepartum Offer MMR pp   Preterm  labor symptoms and general obstetric precautions including but not limited to vaginal bleeding, contractions, leaking of fluid and fetal movement were reviewed in detail with the patient. Please refer to After Visit Summary for other counseling recommendations.   Return 4 weeks LOB, (optimization schedule).   Susi Eric, FNP

## 2023-06-21 ENCOUNTER — Encounter: Payer: Self-pay | Admitting: Women's Health

## 2023-06-21 ENCOUNTER — Encounter: Payer: Self-pay | Admitting: Obstetrics and Gynecology

## 2023-06-21 DIAGNOSIS — O24419 Gestational diabetes mellitus in pregnancy, unspecified control: Secondary | ICD-10-CM | POA: Insufficient documentation

## 2023-06-22 LAB — CBC
Hematocrit: 34.1 % (ref 34.0–46.6)
Hemoglobin: 11.2 g/dL (ref 11.1–15.9)
MCH: 28.2 pg (ref 26.6–33.0)
MCHC: 32.8 g/dL (ref 31.5–35.7)
MCV: 86 fL (ref 79–97)
Platelets: 288 10*3/uL (ref 150–450)
RBC: 3.97 x10E6/uL (ref 3.77–5.28)
RDW: 11.8 % (ref 11.7–15.4)
WBC: 6.6 10*3/uL (ref 3.4–10.8)

## 2023-06-22 LAB — RPR: RPR Ser Ql: NONREACTIVE

## 2023-06-22 LAB — GLUCOSE TOLERANCE, 2 HOURS W/ 1HR
Glucose, 1 hour: 180 mg/dL — ABNORMAL HIGH (ref 70–179)
Glucose, 2 hour: 72 mg/dL (ref 70–152)
Glucose, Fasting: 85 mg/dL (ref 70–91)

## 2023-06-22 LAB — HIV ANTIBODY (ROUTINE TESTING W REFLEX)

## 2023-06-22 LAB — ANTIBODY SCREEN: Antibody Screen: NEGATIVE

## 2023-06-23 ENCOUNTER — Other Ambulatory Visit

## 2023-06-23 DIAGNOSIS — Z3A26 26 weeks gestation of pregnancy: Secondary | ICD-10-CM

## 2023-06-23 DIAGNOSIS — Z131 Encounter for screening for diabetes mellitus: Secondary | ICD-10-CM

## 2023-06-24 LAB — GLUCOSE TOLERANCE, 2 HOURS W/ 1HR
Glucose, 1 hour: 126 mg/dL (ref 70–179)
Glucose, 2 hour: 77 mg/dL (ref 70–152)
Glucose, Fasting: 75 mg/dL (ref 70–91)

## 2023-06-26 ENCOUNTER — Encounter: Payer: Self-pay | Admitting: Women's Health

## 2023-07-18 ENCOUNTER — Ambulatory Visit: Admitting: Women's Health

## 2023-07-18 ENCOUNTER — Encounter: Payer: Self-pay | Admitting: Women's Health

## 2023-07-18 VITALS — BP 130/81 | HR 111 | Wt 205.0 lb

## 2023-07-18 DIAGNOSIS — Z3483 Encounter for supervision of other normal pregnancy, third trimester: Secondary | ICD-10-CM | POA: Diagnosis not present

## 2023-07-18 DIAGNOSIS — Z3480 Encounter for supervision of other normal pregnancy, unspecified trimester: Secondary | ICD-10-CM

## 2023-07-18 DIAGNOSIS — Z3A30 30 weeks gestation of pregnancy: Secondary | ICD-10-CM

## 2023-07-18 DIAGNOSIS — R030 Elevated blood-pressure reading, without diagnosis of hypertension: Secondary | ICD-10-CM

## 2023-07-18 DIAGNOSIS — Z23 Encounter for immunization: Secondary | ICD-10-CM | POA: Diagnosis not present

## 2023-07-18 LAB — POCT URINALYSIS DIPSTICK OB
Blood, UA: NEGATIVE
Glucose, UA: NEGATIVE
Ketones, UA: NEGATIVE
Leukocytes, UA: NEGATIVE
Nitrite, UA: NEGATIVE
POC,PROTEIN,UA: NEGATIVE

## 2023-07-18 NOTE — Progress Notes (Signed)
 LOW-RISK PREGNANCY VISIT Patient name: Cheyenne Bauer MRN [id number]  Date of birth: February 16, 1991 Chief Complaint:   Routine Prenatal Visit  History of Present Illness:   Cheyenne Bauer is a 33 y.o. [id number] female at [redacted]w[redacted]d with an Estimated Date of Delivery: 09/21/23 being seen today for ongoing management of a low-risk pregnancy.   Today she reports had venti coffee w/ double espresso from [employer] this am, didn't realize she had ordered the double espresso. Is also anxious/trying to pack for beach trip this weekend- going to same house she used to go to all the time w/ her dad who recently passed away. Check bp at home BID, all have been normal. Last night 132/78. Denies ha, visual changes, ruq/epigastric pain, n/v.   Contractions: Irritability. Vag. Bleeding: None.  Movement: Present. denies leaking of fluid.     03/09/2023    3:05 PM 05/03/2022   11:27 AM 04/23/2021    9:10 AM 12/29/2020    3:19 PM 07/20/2020   11:39 AM  Depression screen PHQ 2/9  Decreased Interest 1 1 0 1 1  Down, Depressed, Hopeless 1 1 0 1 1  PHQ - 2 Score 2 2 0 2 2  Altered sleeping 1 0 1 1 1   Tired, decreased energy 1 1 1 1 1   Change in appetite 0 3 0 1 1  Feeling bad or failure about yourself  1 2 0 1 1  Trouble concentrating 1 1 1 1 1   Moving slowly or fidgety/restless 1 0 0 1 1  Suicidal thoughts 0 1 0 1 1  PHQ-9 Score 7 10 3 9 9         03/09/2023    3:05 PM 05/03/2022   11:29 AM 04/23/2021    9:11 AM 12/29/2020    3:19 PM  GAD 7 : Generalized Anxiety Score  Nervous, Anxious, on Edge 1 3 0 1  Control/stop worrying 1 3 0 1  Worry too much - different things 1 3 1 1   Trouble relaxing 1 3 0 1  Restless 1 2 0 1  Easily annoyed or irritable 1 3 1 1   Afraid - awful might happen 1 0 0 1  Total GAD 7 Score 7 17 2 7       Review of Systems:   Pertinent items are noted in HPI Denies abnormal vaginal discharge w/ itching/odor/irritation, headaches, visual changes, shortness of breath, chest pain, abdominal pain,  severe nausea/vomiting, or problems with urination or bowel movements unless otherwise stated above. Pertinent History Reviewed:  Reviewed past medical,surgical, social, obstetrical and family history.  Reviewed problem list, medications and allergies. Physical Assessment:   Vitals:   07/18/23 0922 07/18/23 0950  BP: (!) 166/82 130/81  Pulse: (!) 111   Weight: 205 lb (93 kg)   Body mass index is 34.11 kg/m.        Physical Examination:   General appearance: Well appearing, and in no distress  Mental status: Alert, oriented to person, place, and time  Skin: Warm & dry  Cardiovascular: Normal heart rate noted  Respiratory: Normal respiratory effort, no distress  Abdomen: Soft, gravid, nontender  Pelvic: Cervical exam deferred         Extremities: Edema: None  Fetal Status: Fetal Heart Rate (bpm): 134 Fundal Height: 30 cm Movement: Present    Chaperone: N/A Results for orders placed or performed in visit on 07/18/23 (from the past 24 hours)  POC Urinalysis Dipstick OB   Collection Time: 07/18/23  9:26  AM  Result Value Ref Range   Color, UA     Clarity, UA     Glucose, UA Negative Negative   Bilirubin, UA     Ketones, UA neg    Spec Grav, UA     Blood, UA neg    pH, UA     POC,PROTEIN,UA Negative Negative, Trace, Small (1+), Moderate (2+), Large (3+), 4+   Urobilinogen, UA     Nitrite, UA neg    Leukocytes, UA Negative Negative   Appearance     Odor      Assessment & Plan:  1) Low-risk pregnancy [id number] at [redacted]w[redacted]d with an Estimated Date of Delivery: 09/21/23   2) Initial bp elevated, h/o GHTN, had venti coffee w/ double shot [employer] by mistake this am, also anxious about upcoming beach trip. Home bp's bid have been wnl, asymptomatic, no proteinuria. To continue check home bp's, if elevated or pre-e s/s, let us  know, continue ASA  3) Traveling to beach this weekend> continue ASA, discussed she is at a high risk for DVT/PE when pregnancy and traveling increases that risk.  Recommended walking around q 1hr, don't cross legs, and dorsiflex feet often to help decrease r/f DVT/PE. Know where closest hospitals are in case needed   Meds: No orders of the defined types were placed in this encounter.  Labs/procedures today: Tdap  Plan:  Continue routine obstetrical care  Next visit: prefers in person    Reviewed: Preterm labor symptoms and general obstetric precautions including but not limited to vaginal bleeding, contractions, leaking of fluid and fetal movement were reviewed in detail with the patient.  All questions were answered. Does have home bp cuff. Office bp cuff given: not applicable. Check bp daily, let us  know if consistently >140 and/or >90.  Follow-up: Return in about 2 weeks (around 08/01/2023) for LROB, CNM, in person.  No future appointments.  Orders Placed This Encounter  Procedures   POC Urinalysis Dipstick OB   Ferd Householder CNM, [facility] 07/18/2023 9:54 AM

## 2023-07-18 NOTE — Patient Instructions (Signed)
Cheyenne Bauer, thank you for choosing our office today! We appreciate the opportunity to meet your healthcare needs. You may receive a short survey by mail, e-mail, or through [employer]. If you are happy with your care we would appreciate if you could take just a few minutes to complete the survey questions. We read all of your comments and take your feedback very seriously. Thank you again for choosing our office.  Center for [employer] Team at [place] at [facility] ([place], [place] [address]) Entrance C, located off of [address] parking   CLASSES: Go to [email or web address] to register for classes (childbirth, breastfeeding, waterbirth, infant CPR, daddy bootcamp, etc.)  Call the office [phone]) or go to [facility] if: You begin to have strong, frequent contractions Your water breaks.  Sometimes it is a big gush of fluid, sometimes it is just a trickle that keeps getting your panties wet or running down your legs You have vaginal bleeding.  It is normal to have a small amount of spotting if your cervix was checked.  You don't feel your baby moving like normal.  If you don't, get you something to eat and drink and lay down and focus on feeling your baby move.   If your baby is still not moving like normal, you should call the office or go to [facility].  Call the office [phone]) or go to [facility] hospital for these signs of pre-eclampsia: Severe headache that does not go away with Tylenol Visual changes- seeing spots, double, blurred vision Pain under your right breast or upper abdomen that does not go away with Tums or heartburn medicine Nausea and/or vomiting Severe swelling in your hands, feet, and face   Tdap Vaccine It is recommended that you get the Tdap vaccine during the third trimester of EACH pregnancy to help protect your baby from getting pertussis (whooping cough) 27-36 weeks is the BEST time to do this  so that you can pass the protection on to your baby. During pregnancy is better than after pregnancy, but if you are unable to get it during pregnancy it will be offered at the hospital.  You can get this vaccine with [place], at the health department, your family doctor, or some local pharmacies Everyone who will be around your baby should also be up-to-date on their vaccines before the baby comes. Adults (who are not pregnant) only need 1 dose of Tdap during adulthood.   [facility] Pediatricians/Family Doctors [facility]): [address] [place], [place] [facility]: [address] [place], [phone]                [place] [facility]): [place], [phone] (call to ask if accepting patients) [facility] Department: [address], [place], [place] Pediatricians/Family Doctors [facility]): [place] [place], [address], [place] [facility]: [address] [place], [place] [facility]: [place], [facility] [place] [facility]): [phone] [facility]: [address], [place]: [address], [place] [facility]: [phone], [phone]  Home Blood Pressure Monitoring for Patients   Your provider has recommended that you check your  blood pressure (BP) at least once a week at home. If you do not have a blood pressure cuff at home, one will be provided for you. Contact your provider if you have not received your monitor within 1 week.   Helpful Tips for Accurate Home Blood Pressure Checks  Don't smoke, exercise, or drink caffeine 30 minutes before checking your BP Use the restroom before checking your BP (a full bladder can raise your  pressure) Relax in a comfortable upright chair Feet on the ground Left arm resting comfortably on a flat surface at the level of your heart Legs uncrossed Back supported Sit quietly and don't talk Place the cuff on your bare arm Adjust snuggly, so that only two fingertips can fit between your skin and the top of the cuff Check 2 readings separated by at least one minute Keep a log of your BP readings For a visual, please reference this diagram: http://ccnc.care/bpdiagram  Provider Name: [facility]     Phone: [phone]  Zone 1: ALL CLEAR  Continue to monitor your symptoms:  BP reading is less than 140 (top number) or less than 90 (bottom number)  No right upper stomach pain No headaches or seeing spots No feeling nauseated or throwing up No swelling in face and hands  Zone 2: CAUTION Call your doctor's office for any of the following:  BP reading is greater than 140 (top number) or greater than 90 (bottom number)  Stomach pain under your ribs in the middle or right side Headaches or seeing spots Feeling nauseated or throwing up Swelling in face and hands  Zone 3: EMERGENCY  Seek immediate medical care if you have any of the following:  BP reading is greater than160 (top number) or greater than 110 (bottom number) Severe headaches not improving with Tylenol Serious difficulty catching your breath Any worsening symptoms from Zone 2   Third Trimester of Pregnancy The third trimester is from week 29 through week 42, months 7 through 9. The third trimester is a time when the fetus is growing rapidly. At the end of the ninth month, the fetus is about 20 inches in length and weighs 6-10 pounds.  BODY CHANGES Your body goes through many changes during pregnancy. The changes vary from woman to woman.  Your weight will continue to increase. You can expect to gain 25-35 pounds (11-16 kg) by the end of the pregnancy. You may begin to get stretch marks on your hips, abdomen,  and breasts. You may urinate more often because the fetus is moving lower into your pelvis and pressing on your bladder. You may develop or continue to have heartburn as a result of your pregnancy. You may develop constipation because certain hormones are causing the muscles that push waste through your intestines to slow down. You may develop hemorrhoids or swollen, bulging veins (varicose veins). You may have pelvic pain because of the weight gain and pregnancy hormones relaxing your joints between the bones in your pelvis. Backaches may result from overexertion of the muscles supporting your posture. You may have changes in your hair. These can include thickening of your hair, rapid growth, and changes in texture. Some women also have hair loss during or after pregnancy, or hair that feels dry or thin. Your hair will most likely return to normal after your baby is born. Your breasts will continue to grow and be tender. A yellow discharge may leak from your breasts called colostrum. Your belly button may stick out. You may   feel short of breath because of your expanding uterus. You may notice the fetus "dropping," or moving lower in your abdomen. You may have a bloody mucus discharge. This usually occurs a few days to a week before labor begins. Your cervix becomes thin and soft (effaced) near your due date. WHAT TO EXPECT AT YOUR PRENATAL EXAMS  You will have prenatal exams every 2 weeks until week 36. Then, you will have weekly prenatal exams. During a routine prenatal visit: You will be weighed to make sure you and the fetus are growing normally. Your blood pressure is taken. Your abdomen will be measured to track your baby's growth. The fetal heartbeat will be listened to. Any test results from the previous visit will be discussed. You may have a cervical check near your due date to see if you have effaced. At around 36 weeks, your caregiver will check your cervix. At the same time, your  caregiver will also perform a test on the secretions of the vaginal tissue. This test is to determine if a type of bacteria, Group B streptococcus, is present. Your caregiver will explain this further. Your caregiver may ask you: What your birth plan is. How you are feeling. If you are feeling the baby move. If you have had any abnormal symptoms, such as leaking fluid, bleeding, severe headaches, or abdominal cramping. If you have any questions. Other tests or screenings that may be performed during your third trimester include: Blood tests that check for low iron levels (anemia). Fetal testing to check the health, activity level, and growth of the fetus. Testing is done if you have certain medical conditions or if there are problems during the pregnancy. FALSE LABOR You may feel small, irregular contractions that eventually go away. These are called Braxton Hicks contractions, or false labor. Contractions may last for hours, days, or even weeks before true labor sets in. If contractions come at regular intervals, intensify, or become painful, it is best to be seen by your caregiver.  SIGNS OF LABOR  Menstrual-like cramps. Contractions that are 5 minutes apart or less. Contractions that start on the top of the uterus and spread down to the lower abdomen and back. A sense of increased pelvic pressure or back pain. A watery or bloody mucus discharge that comes from the vagina. If you have any of these signs before the 37th week of pregnancy, call your caregiver right away. You need to go to the hospital to get checked immediately. HOME CARE INSTRUCTIONS  Avoid all smoking, herbs, alcohol, and unprescribed drugs. These chemicals affect the formation and growth of the baby. Follow your caregiver's instructions regarding medicine use. There are medicines that are either safe or unsafe to take during pregnancy. Exercise only as directed by your caregiver. Experiencing uterine cramps is a good sign to  stop exercising. Continue to eat regular, healthy meals. Wear a good support bra for breast tenderness. Do not use hot tubs, steam rooms, or saunas. Wear your seat belt at all times when driving. Avoid raw meat, uncooked cheese, cat litter boxes, and soil used by cats. These carry germs that can cause birth defects in the baby. Take your prenatal vitamins. Try taking a stool softener (if your caregiver approves) if you develop constipation. Eat more high-fiber foods, such as fresh vegetables or fruit and whole grains. Drink plenty of fluids to keep your urine clear or pale yellow. Take warm sitz baths to soothe any pain or discomfort caused by hemorrhoids. Use hemorrhoid cream if  your caregiver approves. If you develop varicose veins, wear support hose. Elevate your feet for 15 minutes, 3-4 times a day. Limit salt in your diet. Avoid heavy lifting, wear low heal shoes, and practice good posture. Rest a lot with your legs elevated if you have leg cramps or low back pain. Visit your dentist if you have not gone during your pregnancy. Use a soft toothbrush to brush your teeth and be gentle when you floss. A sexual relationship may be continued unless your caregiver directs you otherwise. Do not travel far distances unless it is absolutely necessary and only with the approval of your caregiver. Take prenatal classes to understand, practice, and ask questions about the labor and delivery. Make a trial run to the hospital. Pack your hospital bag. Prepare the baby's nursery. Continue to go to all your prenatal visits as directed by your caregiver. SEEK MEDICAL CARE IF: You are unsure if you are in labor or if your water has broken. You have dizziness. You have mild pelvic cramps, pelvic pressure, or nagging pain in your abdominal area. You have persistent nausea, vomiting, or diarrhea. You have a bad smelling vaginal discharge. You have pain with urination. SEEK IMMEDIATE MEDICAL CARE IF:  You  have a fever. You are leaking fluid from your vagina. You have spotting or bleeding from your vagina. You have severe abdominal cramping or pain. You have rapid weight loss or gain. You have shortness of breath with chest pain. You notice sudden or extreme swelling of your face, hands, ankles, feet, or legs. You have not felt your baby move in over an hour. You have severe headaches that do not go away with medicine. You have vision changes. Document Released: 02/08/2001 Document Revised: 02/19/2013 Document Reviewed: 04/17/2012 [facility] Patient Information 2015 [place], [place]. This information is not intended to replace advice given to you by your health care provider. Make sure you discuss any questions you have with your health care provider.

## 2023-07-18 NOTE — Addendum Note (Signed)
 Addended by: Myrl Askew on: 07/18/2023 10:00 AM   Modules accepted: Orders

## 2023-08-02 ENCOUNTER — Ambulatory Visit: Admitting: Advanced Practice Midwife

## 2023-08-02 VITALS — BP 132/79 | HR 80 | Wt 209.0 lb

## 2023-08-02 DIAGNOSIS — Z3483 Encounter for supervision of other normal pregnancy, third trimester: Secondary | ICD-10-CM | POA: Diagnosis not present

## 2023-08-02 DIAGNOSIS — Z3480 Encounter for supervision of other normal pregnancy, unspecified trimester: Secondary | ICD-10-CM

## 2023-08-02 DIAGNOSIS — Z3A32 32 weeks gestation of pregnancy: Secondary | ICD-10-CM | POA: Diagnosis not present

## 2023-08-02 NOTE — Progress Notes (Signed)
   LOW-RISK PREGNANCY VISIT Patient name: Cheyenne Bauer MRN [id number]  Date of birth: 1990/10/28 Chief Complaint:   Routine Prenatal Visit  History of Present Illness:   Cheyenne Bauer is a 33 y.o. [id number] female at [redacted]w[redacted]d with an Estimated Date of Delivery: 09/21/23 being seen today for ongoing management of a low-risk pregnancy.  Today she reports no complaints. Contractions: Not present. Vag. Bleeding: None.  Movement: Present. denies leaking of fluid. Review of Systems:   Pertinent items are noted in HPI Denies abnormal vaginal discharge w/ itching/odor/irritation, headaches, visual changes, shortness of breath, chest pain, abdominal pain, severe nausea/vomiting, or problems with urination or bowel movements unless otherwise stated above. Pertinent History Reviewed:  Reviewed past medical,surgical, social, obstetrical and family history.  Reviewed problem list, medications and allergies. Physical Assessment:   Vitals:   08/02/23 1603  BP: 132/79  Pulse: 80  Weight: 209 lb (94.8 kg)  Body mass index is 34.78 kg/m.        Physical Examination:   General appearance: Well appearing, and in no distress  Mental status: Alert, oriented to person, place, and time  Skin: Warm & dry  Cardiovascular: Normal heart rate noted  Respiratory: Normal respiratory effort, no distress  Abdomen: Soft, gravid, nontender  Pelvic: Cervical exam deferred         Extremities:    Fetal Status: Fetal Heart Rate (bpm): 153 Fundal Height: 32 cm Movement: Present    No results found for this or any previous visit (from the past 24 hours).  Assessment & Plan:  1) Low-risk pregnancy [id number] at [redacted]w[redacted]d with an Estimated Date of Delivery: 09/21/23   2) Hx gHTN, BPs have been normal since 1 elevations at visit 5/20 w double shot espresso  3) Due for Pap, will get w next visit while collecting GBS swab   Meds: No orders of the defined types were placed in this encounter.  Labs/procedures today: none  Plan:   Continue routine obstetrical care   Reviewed: Preterm labor symptoms and general obstetric precautions including but not limited to vaginal bleeding, contractions, leaking of fluid and fetal movement were reviewed in detail with the patient.  All questions were answered. Has home bp cuff. Check bp weekly, let us  know if >140/90.   Follow-up: Return for 3-4wk LROB (GBS/cultures).  No orders of the defined types were placed in this encounter.  Jolayne Natter CNM 08/02/2023 4:35 PM

## 2023-08-24 ENCOUNTER — Other Ambulatory Visit (HOSPITAL_COMMUNITY)
Admission: RE | Admit: 2023-08-24 | Discharge: 2023-08-24 | Disposition: A | Discharge status: Home / Self Care | Source: Ambulatory Visit | Attending: Women's Health | Admitting: Women's Health

## 2023-08-24 ENCOUNTER — Encounter: Payer: Self-pay | Admitting: Women's Health

## 2023-08-24 ENCOUNTER — Ambulatory Visit: Admitting: Women's Health

## 2023-08-24 VITALS — BP 134/80 | HR 114 | Wt 213.4 lb

## 2023-08-24 DIAGNOSIS — Z3A36 36 weeks gestation of pregnancy: Secondary | ICD-10-CM | POA: Diagnosis not present

## 2023-08-24 DIAGNOSIS — Z3483 Encounter for supervision of other normal pregnancy, third trimester: Secondary | ICD-10-CM | POA: Diagnosis not present

## 2023-08-24 DIAGNOSIS — Z3480 Encounter for supervision of other normal pregnancy, unspecified trimester: Secondary | ICD-10-CM | POA: Diagnosis present

## 2023-08-24 LAB — POCT URINALYSIS DIPSTICK OB
Blood, UA: NEGATIVE
Glucose, UA: NEGATIVE
Ketones, UA: NEGATIVE
Leukocytes, UA: NEGATIVE
Nitrite, UA: NEGATIVE
POC,PROTEIN,UA: NEGATIVE

## 2023-08-24 NOTE — Progress Notes (Signed)
 LOW-RISK PREGNANCY VISIT Patient name: Cheyenne Bauer MRN [id number]  Date of birth: [birth year] Chief Complaint:   Routine Prenatal Visit  History of Present Illness:   Cheyenne Bauer is a 33 y.o. [id number] female at [redacted]w[redacted]d with an Estimated Date of Delivery: 09/21/23 being seen today for ongoing management of a low-risk pregnancy.   Today she reports home bp's 120s-130s/70s-80s. Denies ha, visual changes, ruq/epigastric pain, n/v.   Contractions: Irritability. Vag. Bleeding: None.  Movement: Present. denies leaking of fluid.     03/09/2023    3:05 PM 05/03/2022   11:27 AM 04/23/2021    9:10 AM 12/29/2020    3:19 PM 07/20/2020   11:39 AM  Depression screen PHQ 2/9  Decreased Interest 1 1 0 1 1  Down, Depressed, Hopeless 1 1 0 1 1  PHQ - 2 Score 2 2 0 2 2  Altered sleeping 1 0 1 1 1   Tired, decreased energy 1 1 1 1 1   Change in appetite 0 3 0 1 1  Feeling bad or failure about yourself  1 2 0 1 1  Trouble concentrating 1 1 1 1 1   Moving slowly or fidgety/restless 1 0 0 1 1  Suicidal thoughts 0 1 0 1 1  PHQ-9 Score 7 10 3 9 9         03/09/2023    3:05 PM 05/03/2022   11:29 AM 04/23/2021    9:11 AM 12/29/2020    3:19 PM  GAD 7 : Generalized Anxiety Score  Nervous, Anxious, on Edge 1 3 0 1  Control/stop worrying 1 3 0 1  Worry too much - different things 1 3 1 1   Trouble relaxing 1 3 0 1  Restless 1 2 0 1  Easily annoyed or irritable 1 3 1 1   Afraid - awful might happen 1 0 0 1  Total GAD 7 Score 7 17 2 7       Review of Systems:   Pertinent items are noted in HPI Denies abnormal vaginal discharge w/ itching/odor/irritation, headaches, visual changes, shortness of breath, chest pain, abdominal pain, severe nausea/vomiting, or problems with urination or bowel movements unless otherwise stated above. Pertinent History Reviewed:  Reviewed past medical,surgical, social, obstetrical and family history.  Reviewed problem list, medications and allergies. Physical Assessment:   Vitals:    08/24/23 1604 08/24/23 1605  BP: (!) 147/83 134/80  Pulse: (!) 114   Weight: 213 lb 6.4 oz (96.8 kg)   Body mass index is 35.51 kg/m.        Physical Examination:   General appearance: Well appearing, and in no distress  Mental status: Alert, oriented to person, place, and time  Skin: Warm & dry  Cardiovascular: Normal heart rate noted  Respiratory: Normal respiratory effort, no distress  Abdomen: Soft, gravid, nontender  Pelvic: Cervical exam deferred  Dilation: Fingertip Effacement (%): Thick Station: -3  Extremities: Edema: Trace  Fetal Status: Fetal Heart Rate (bpm): 145 Fundal Height: 36 cm Movement: Present Presentation: Vertex  Chaperone: Aleck Blase Results for orders placed or performed in visit on 08/24/23 (from the past 24 hours)  POC Urinalysis Dipstick OB   Collection Time: 08/24/23  4:42 PM  Result Value Ref Range   Color, UA     Clarity, UA     Glucose, UA Negative Negative   Bilirubin, UA     Ketones, UA neg    Spec Grav, UA     Blood, UA neg    pH,  UA     POC,PROTEIN,UA Negative Negative, Trace, Small (1+), Moderate (2+), Large (3+), 4+   Urobilinogen, UA     Nitrite, UA neg    Leukocytes, UA Negative Negative   Appearance     Odor      Assessment & Plan:  1) Low-risk pregnancy [id number] at [redacted]w[redacted]d with an Estimated Date of Delivery: 09/21/23   2) Initially elevated bp w/ h/o GHTN, better on repeat, bp up at 30w (had venti coffee w/ double shot espresso), have been 120s-130s/70s-70s at home, asymptomatic, no proteinuria. F/U tomorrow for bp check   Meds: No orders of the defined types were placed in this encounter.  Labs/procedures today: pap, GBS, GC/CT, and SVE  Plan:  Continue routine obstetrical care  Next visit: prefers in person    Reviewed: Preterm labor symptoms and general obstetric precautions including but not limited to vaginal bleeding, contractions, leaking of fluid and fetal movement were reviewed in detail with the patient.  All  questions were answered. Does have home bp cuff. Office bp cuff given: not applicable. Check bp twice daily, let us  know if consistently >140 and/or >90.  Follow-up: Return for tomorrow bp check w/ nurse; then 1wk LROB .  Future Appointments  Date Time Provider Department Center  08/25/2023  8:50 AM [facility] NURSE [facility]  08/31/2023  8:50 AM Ozan, Jennifer, DO [facility]    Orders Placed This Encounter  Procedures   Culture, beta strep (group b only)   POC Urinalysis Dipstick OB   Suzen JONELLE Fetters CNM, [facility] 08/24/2023 4:51 PM

## 2023-08-24 NOTE — Patient Instructions (Signed)
 Krystalle, thank you for choosing our office today! We appreciate the opportunity to meet your healthcare needs. You may receive a short survey by mail, e-mail, or through [employer]. If you are happy with your care we would appreciate if you could take just a few minutes to complete the survey questions. We read all of your comments and take your feedback very seriously. Thank you again for choosing our office.  Center for [employer] Team at [facility]  [facility] at [facility] ([address] [place], [place] [address]) Entrance C, located off of [address] Free 24/7 valet parking   CLASSES: Go to [email or web address] to register for classes (childbirth, breastfeeding, waterbirth, infant CPR, daddy bootcamp, etc.)  Call the office [phone]) or go to [facility] if: You begin to have strong, frequent contractions Your water breaks.  Sometimes it is a big gush of fluid, sometimes it is just a trickle that keeps getting your panties wet or running down your legs You have vaginal bleeding.  It is normal to have a small amount of spotting if your cervix was checked.  You don't feel your baby moving like normal.  If you don't, get you something to eat and drink and lay down and focus on feeling your baby move.   If your baby is still not moving like normal, you should call the office or go to [facility].  Call the office [phone]) or go to [facility] hospital for these signs of pre-eclampsia: Severe headache that does not go away with Tylenol  Visual changes- seeing spots, double, blurred vision Pain under your right breast or upper abdomen that does not go away with Tums or heartburn medicine Nausea and/or vomiting Severe swelling in your hands, feet, and face   Tdap Vaccine It is recommended that you get the Tdap vaccine during the third trimester of EACH pregnancy to help protect your baby from getting pertussis (whooping cough) 27-36 weeks is the BEST time to do this  so that you can pass the protection on to your baby. During pregnancy is better than after pregnancy, but if you are unable to get it during pregnancy it will be offered at the hospital.  You can get this vaccine with us , at the health department, your family doctor, or some local pharmacies Everyone who will be around your baby should also be up-to-date on their vaccines before the baby comes. Adults (who are not pregnant) only need 1 dose of Tdap during adulthood.   [facility] Pediatricians/Family Doctors [facility]): [address] Luba BROCKS, [phone]           [facility]: [address], [phone]                [facility]): [address], [phone] (call to ask if accepting patients) [facility] Department: [address], [place], [phone]    [facility] Pediatricians/Family Doctors [facility]): [phone] [address], [phone] [facility]: [address] [place], [phone] [facility] [place]: [address], [phone]  [facility] Doctors  [facility]): [phone] [facility]: [address], [phone]   [facility] Doctors [facility]: [address], [phone]  [facility] Family Doctors  [facility]: [phone], [phone]  Home Blood Pressure Monitoring for Patients   Your provider has recommended that you check your  blood pressure (BP) at least once a week at home. If you do not have a blood pressure cuff at home, one will be provided for you. Contact your provider if you have not received your monitor within 1 week.   Helpful Tips for Accurate Home Blood Pressure Checks  Don't smoke, exercise, or drink caffeine 30 minutes before checking your BP Use the restroom before checking your BP (a full bladder can raise your  pressure) Relax in a comfortable upright chair Feet on the ground Left arm resting comfortably on a flat surface at the level of your heart Legs uncrossed Back supported Sit quietly and don't talk Place the cuff on your bare arm Adjust snuggly, so that only two fingertips can fit between your skin and the top of the cuff Check 2 readings separated by at least one minute Keep a log of your BP readings For a visual, please reference this diagram: http://ccnc.care/bpdiagram  Provider Name: [facility]     Phone: [phone]  Zone 1: ALL CLEAR  Continue to monitor your symptoms:  BP reading is less than 140 (top number) or less than 90 (bottom number)  No right upper stomach pain No headaches or seeing spots No feeling nauseated or throwing up No swelling in face and hands  Zone 2: CAUTION Call your doctor's office for any of the following:  BP reading is greater than 140 (top number) or greater than 90 (bottom number)  Stomach pain under your ribs in the middle or right side Headaches or seeing spots Feeling nauseated or throwing up Swelling in face and hands  Zone 3: EMERGENCY  Seek immediate medical care if you have any of the following:  BP reading is greater than160 (top number) or greater than 110 (bottom number) Severe headaches not improving with Tylenol  Serious difficulty catching your breath Any worsening symptoms from Zone 2  Preterm Labor and Birth Information  The normal length of a pregnancy is 39-41 weeks. Preterm labor is when labor starts before 37 completed weeks of pregnancy. What are the risk factors for preterm labor? Preterm labor is more likely to occur in women who: Have certain infections during pregnancy such as a bladder infection, sexually transmitted infection, or infection inside the uterus (chorioamnionitis). Have a shorter-than-normal cervix. Have gone into preterm labor before. Have had surgery on their cervix. Are younger than age 40  or older than age 84. Are African American. Are pregnant with twins or multiple babies (multiple gestation). Take street drugs or smoke while pregnant. Do not gain enough weight while pregnant. Became pregnant shortly after having been pregnant. What are the symptoms of preterm labor? Symptoms of preterm labor include: Cramps similar to those that can happen during a menstrual period. The cramps may happen with diarrhea. Pain in the abdomen or lower back. Regular uterine contractions that may feel like tightening of the abdomen. A feeling of increased pressure in the pelvis. Increased watery or bloody mucus discharge from the vagina. Water breaking (ruptured amniotic sac). Why is it important to recognize signs of preterm labor? It is important to recognize signs of preterm labor because babies who are born prematurely may not be fully developed. This can put them at an increased risk for: Long-term (chronic) heart and lung problems. Difficulty immediately after birth with regulating body systems, including blood sugar, body temperature, heart rate, and breathing rate. Bleeding in the brain. Cerebral palsy. Learning difficulties. Death. These risks are highest for babies who are born before 34 weeks  of pregnancy. How is preterm labor treated? Treatment depends on the length of your pregnancy, your condition, and the health of your baby. It may involve: Having a stitch (suture) placed in your cervix to prevent your cervix from opening too early (cerclage). Taking or being given medicines, such as: Hormone medicines. These may be given early in pregnancy to help support the pregnancy. Medicine to stop contractions. Medicines to help mature the baby's lungs. These may be prescribed if the risk of delivery is high. Medicines to prevent your baby from developing cerebral palsy. If the labor happens before 34 weeks of pregnancy, you may need to stay in the hospital. What should I do if I  think I am in preterm labor? If you think that you are going into preterm labor, call your health care provider right away. How can I prevent preterm labor in future pregnancies? To increase your chance of having a full-term pregnancy: Do not use any tobacco products, such as cigarettes, chewing tobacco, and e-cigarettes. If you need help quitting, ask your health care provider. Do not use street drugs or medicines that have not been prescribed to you during your pregnancy. Talk with your health care provider before taking any herbal supplements, even if you have been taking them regularly. Make sure you gain a healthy amount of weight during your pregnancy. Watch for infection. If you think that you might have an infection, get it checked right away. Make sure to tell your health care provider if you have gone into preterm labor before. This information is not intended to replace advice given to you by your health care provider. Make sure you discuss any questions you have with your health care provider. Document Revised: 06/08/2018 Document Reviewed: 07/08/2015 Elsevier Patient Education  2020 [employer].

## 2023-08-25 ENCOUNTER — Ambulatory Visit (INDEPENDENT_AMBULATORY_CARE_PROVIDER_SITE_OTHER)

## 2023-08-25 VITALS — BP 134/83

## 2023-08-25 DIAGNOSIS — Z3A36 36 weeks gestation of pregnancy: Secondary | ICD-10-CM

## 2023-08-25 DIAGNOSIS — Z8759 Personal history of other complications of pregnancy, childbirth and the puerperium: Secondary | ICD-10-CM

## 2023-08-25 NOTE — Progress Notes (Signed)
   NURSE VISIT- BLOOD PRESSURE CHECK  SUBJECTIVE:  Cheyenne Bauer is a 33 y.o. [phone] female here for BP check. She is [redacted]w[redacted]d pregnant Patient endorses blood pressures at home to be 134/86 at 6 pm last night, 132/82 at 9 pm last night and 124/81 at 8:30 this morning. Patient would prefer to not be induced until 38 weeks due to past experience with previous births.   HYPERTENSION ROS:  Pregnant  Severe headaches that don't go away with tylenol /other medicines: No  Visual changes (seeing spots/double/blurred vision) No  Severe pain under right breast breast or in center of upper chest No  Severe nausea/vomiting No  Taking medicines as instructed not applicable   OBJECTIVE:  BP 134/83 (BP Location: Right Arm, Patient Position: Sitting, Cuff Size: Normal)   LMP 12/15/2022   Appearance alert, well appearing, and in no distress.  ASSESSMENT: Pregnancy [redacted]w[redacted]d  blood pressure check  PLAN: Discussed with Luke Gentry, CNM   Recommendations: check pre-e labs today   Follow-up: as scheduled   Aleese Kamps E Wallis Spizzirri  08/25/2023 9:20 AM

## 2023-08-26 LAB — COMPREHENSIVE METABOLIC PANEL WITH GFR
ALT: 12 IU/L (ref 0–32)
AST: 15 IU/L (ref 0–40)
Albumin: 3.5 g/dL — ABNORMAL LOW (ref 3.9–4.9)
Alkaline Phosphatase: 115 IU/L (ref 44–121)
BUN/Creatinine Ratio: 16 (ref 9–23)
BUN: 10 mg/dL (ref 6–20)
Bilirubin Total: <0.2 mg/dL (ref 0.0–1.2)
CO2: 18 mmol/L — ABNORMAL LOW (ref 20–29)
Calcium: 8.4 mg/dL — ABNORMAL LOW (ref 8.7–10.2)
Chloride: 105 mmol/L (ref 96–106)
Creatinine, Ser: 0.64 mg/dL (ref 0.57–1.00)
Globulin, Total: 2.4 g/dL (ref 1.5–4.5)
Glucose: 82 mg/dL (ref 70–99)
Potassium: 4.1 mmol/L (ref 3.5–5.2)
Sodium: 138 mmol/L (ref 134–144)
Total Protein: 5.9 g/dL — ABNORMAL LOW (ref 6.0–8.5)
eGFR: 121 mL/min/{1.73_m2} (ref >59)

## 2023-08-26 LAB — PROTEIN / CREATININE RATIO, URINE
Creatinine, Urine: 89.7 mg/dL
Protein, Ur: 9.6 mg/dL
Protein/Creat Ratio: 107 mg/g{creat} (ref 0–200)

## 2023-08-26 LAB — CBC
Hematocrit: 34.3 % (ref 34.0–46.6)
Hemoglobin: 10.6 g/dL — ABNORMAL LOW (ref 11.1–15.9)
MCH: 26.2 pg — ABNORMAL LOW (ref 26.6–33.0)
MCHC: 30.9 g/dL — ABNORMAL LOW (ref 31.5–35.7)
MCV: 85 fL (ref 79–97)
Platelets: 291 10*3/uL (ref 150–450)
RBC: 4.05 x10E6/uL (ref 3.77–5.28)
RDW: 13.5 % (ref 11.7–15.4)
WBC: 7.5 10*3/uL (ref 3.4–10.8)

## 2023-08-29 LAB — CYTOLOGY - PAP
Chlamydia: NEGATIVE
Comment: NEGATIVE
Comment: NEGATIVE
Comment: NORMAL
Diagnosis: NEGATIVE
High risk HPV: NEGATIVE
Neisseria Gonorrhea: NEGATIVE

## 2023-08-29 LAB — CULTURE, BETA STREP (GROUP B ONLY)

## 2023-08-30 ENCOUNTER — Ambulatory Visit: Payer: Self-pay | Admitting: Advanced Practice Midwife

## 2023-08-30 ENCOUNTER — Ambulatory Visit: Payer: Self-pay | Admitting: Women's Health

## 2023-08-30 DIAGNOSIS — Z3480 Encounter for supervision of other normal pregnancy, unspecified trimester: Secondary | ICD-10-CM

## 2023-08-31 ENCOUNTER — Encounter: Payer: Self-pay | Admitting: Obstetrics & Gynecology

## 2023-08-31 ENCOUNTER — Ambulatory Visit: Admitting: Obstetrics & Gynecology

## 2023-08-31 VITALS — BP 137/84 | HR 118 | Wt 212.8 lb

## 2023-08-31 DIAGNOSIS — Z3A37 37 weeks gestation of pregnancy: Secondary | ICD-10-CM | POA: Diagnosis not present

## 2023-08-31 DIAGNOSIS — O133 Gestational [pregnancy-induced] hypertension without significant proteinuria, third trimester: Secondary | ICD-10-CM

## 2023-08-31 DIAGNOSIS — O0993 Supervision of high risk pregnancy, unspecified, third trimester: Secondary | ICD-10-CM | POA: Diagnosis not present

## 2023-08-31 LAB — POCT URINALYSIS DIPSTICK OB
Blood, UA: NEGATIVE
Glucose, UA: NEGATIVE
Ketones, UA: NEGATIVE
Leukocytes, UA: NEGATIVE
Nitrite, UA: NEGATIVE
POC,PROTEIN,UA: NEGATIVE

## 2023-08-31 NOTE — Progress Notes (Signed)
 HIGH-RISK PREGNANCY VISIT Patient name: Cheyenne Bauer MRN [id number]  Date of birth: [birth year] Chief Complaint:   Routine Prenatal Visit  History of Present Illness:   Cheyenne Bauer is a 33 y.o. [id number] female at [redacted]w[redacted]d with an Estimated Date of Delivery: 09/21/23 being seen today for ongoing management of a high-risk pregnancy complicated by:  -Gestational HTN BP stable mostly 130/80s at home, BP this am 141/78- pt notes she was running around getting kids ready Asx  Today she reports no complaints.   Contractions: Irregular. Vag. Bleeding: None.  Movement: Present. denies leaking of fluid.      03/09/2023    3:05 PM 05/03/2022   11:27 AM 04/23/2021    9:10 AM 12/29/2020    3:19 PM 07/20/2020   11:39 AM  Depression screen PHQ 2/9  Decreased Interest 1 1 0 1 1  Down, Depressed, Hopeless 1 1 0 1 1  PHQ - 2 Score 2 2 0 2 2  Altered sleeping 1 0 1 1 1   Tired, decreased energy 1 1 1 1 1   Change in appetite 0 3 0 1 1  Feeling bad or failure about yourself  1 2 0 1 1  Trouble concentrating 1 1 1 1 1   Moving slowly or fidgety/restless 1 0 0 1 1  Suicidal thoughts 0 1 0 1 1  PHQ-9 Score 7 10 3 9 9      Current Outpatient Medications  Medication Instructions   aspirin  EC 162 mg, Oral, Daily   Doxylamine -Pyridoxine  (DICLEGIS ) 10-10 MG TBEC Take 2 qhs; may also take one in am and one in afternoon prn nausea   Prenatal Vit-Fe Fumarate-FA (PRENATAL VITAMIN PO) Take by mouth.     Review of Systems:   Pertinent items are noted in HPI Denies abnormal vaginal discharge w/ itching/odor/irritation, headaches, visual changes, shortness of breath, chest pain, abdominal pain, severe nausea/vomiting, or problems with urination or bowel movements unless otherwise stated above. Pertinent History Reviewed:  Reviewed past medical,surgical, social, obstetrical and family history.  Reviewed problem list, medications and allergies. Physical Assessment:   Vitals:   08/31/23 0850 08/31/23 0853  BP:  (!) 154/81 137/84  Pulse: 94 (!) 118  Weight: 212 lb 12.8 oz (96.5 kg)   Body mass index is 35.41 kg/m.           Physical Examination:   General appearance: alert, well appearing, and in no distress  Mental status: normal mood, behavior, speech, dress, motor activity, and thought processes  Skin: warm & dry   Extremities: Edema: Trace    Cardiovascular: normal heart rate noted  Respiratory: normal respiratory effort, no distress  Abdomen: gravid, soft, non-tender  Pelvic: Cervical exam deferred         Fetal Status:     Movement: Present    Fetal Surveillance Testing today: NST  NST being performed due to [facility]   Fetal Monitoring:  Baseline: 140 bpm, Variability: moderate, Accelerations: present, The accelerations are >15 bpm and more than 2 in 20 minutes, and Decelerations: Absent     Final diagnosis:  Reactive NST      Chaperone: N/A    No results found for this or any previous visit (from the past 24 hours).   Assessment & Plan:  High-risk pregnancy: [id number] at [redacted]w[redacted]d with an Estimated Date of Delivery: 09/21/23   1) GestHTN -pt aware that guidelines recommend IOL @ 37wk, she would prefer to continue to 39wks -reviewed precautions and pt notes good understanding that  should she have any changes- rising BP, headache, blurry vision RUQ pain or other acute changes patient to go immediately to women's - IOL scheduled for 7/17   Meds: No orders of the defined types were placed in this encounter.   Labs/procedures today: NST  Treatment Plan:  as outlined above- pt agreeable to weekly NST  Reviewed: Term labor symptoms and general obstetric precautions including but not limited to vaginal bleeding, contractions, leaking of fluid and fetal movement were reviewed in detail with the patient.  All questions were answered. Pt has home bp cuff. Check bp daily, let us  know if >140/90.   Follow-up: Return in about 1 week (around 09/07/2023) for LROB visit and NST  weekly.   Future Appointments  Date Time Provider Department Center  09/07/2023 11:30 AM [facility] NURSE [facility]  09/07/2023 11:50 AM Kizzie Suzen SAUNDERS, CNM [facility]    No orders of the defined types were placed in this encounter.   Darnel Mchan, DO Attending Obstetrician & Gynecologist, [facility] for [employer], [facility]

## 2023-08-31 NOTE — Addendum Note (Signed)
 Addended by: ILEAN RUTHERFORD HERO on: 08/31/2023 09:39 AM   Modules accepted: Orders

## 2023-09-07 ENCOUNTER — Ambulatory Visit: Admitting: Women's Health

## 2023-09-07 ENCOUNTER — Other Ambulatory Visit

## 2023-09-07 ENCOUNTER — Encounter: Payer: Self-pay | Admitting: Women's Health

## 2023-09-07 VITALS — BP 127/80 | HR 112 | Wt 216.0 lb

## 2023-09-07 DIAGNOSIS — O0993 Supervision of high risk pregnancy, unspecified, third trimester: Secondary | ICD-10-CM | POA: Diagnosis not present

## 2023-09-07 DIAGNOSIS — Z3A38 38 weeks gestation of pregnancy: Secondary | ICD-10-CM

## 2023-09-07 DIAGNOSIS — O133 Gestational [pregnancy-induced] hypertension without significant proteinuria, third trimester: Secondary | ICD-10-CM

## 2023-09-07 LAB — POCT URINALYSIS DIPSTICK OB
Blood, UA: NEGATIVE
Glucose, UA: NEGATIVE
Ketones, UA: NEGATIVE
Leukocytes, UA: NEGATIVE
Nitrite, UA: NEGATIVE
POC,PROTEIN,UA: NEGATIVE

## 2023-09-07 NOTE — Patient Instructions (Signed)
 Cheyenne Bauer, thank you for choosing our office today! We appreciate the opportunity to meet your healthcare needs. You may receive a short survey by mail, e-mail, or through [employer]. If you are happy with your care we would appreciate if you could take just a few minutes to complete the survey questions. We read all of your comments and take your feedback very seriously. Thank you again for choosing our office.  Center for [employer] Team at [facility]  [facility] at [facility] ([address] [place], [place] [address]) Entrance C, located off of [address] Free 24/7 valet parking   CLASSES: Go to [email or web address] to register for classes (childbirth, breastfeeding, waterbirth, infant CPR, daddy bootcamp, etc.)  Call the office [phone]) or go to [facility] if: You begin to have strong, frequent contractions Your water breaks.  Sometimes it is a big gush of fluid, sometimes it is just a trickle that keeps getting your panties wet or running down your legs You have vaginal bleeding.  It is normal to have a small amount of spotting if your cervix was checked.  You don't feel your baby moving like normal.  If you don't, get you something to eat and drink and lay down and focus on feeling your baby move.   If your baby is still not moving like normal, you should call the office or go to [facility].  Call the office [phone]) or go to [facility] hospital for these signs of pre-eclampsia: Severe headache that does not go away with Tylenol  Visual changes- seeing spots, double, blurred vision Pain under your right breast or upper abdomen that does not go away with Tums or heartburn medicine Nausea and/or vomiting Severe swelling in your hands, feet, and face   [place] [facility]/Family Doctors [facility]): [address] Luba BROCKS, [phone]           [facility]: [address], [phone]                 [facility]): [address], [phone] (call to ask if accepting patients) [facility] Department: [address] [place], [phone]    [facility] Pediatricians/Family Doctors [facility]): [address], [phone] [facility]: [address] [place], [phone] [facility]: [address], [phone]  [facility] Doctors  [facility]): [phone] [facility]: [address] [phone]   [facility] Doctors [facility]: [address] [phone]  [facility] Doctors  [facility]: [phone], [phone]  Home Blood Pressure Monitoring for Patients   Your provider has recommended that you check your blood pressure (BP) at least once a week at home. If you do not have a blood pressure cuff at home, one will be provided for you. Contact your provider if you have not received your monitor within 1 week.   Helpful Tips for Accurate Home Blood Pressure Checks  Don't smoke, exercise, or drink caffeine 30 minutes before checking your BP Use the restroom before checking your BP (a full bladder can raise your pressure) Relax in a comfortable upright chair Feet on the ground Left arm resting comfortably on a flat surface at the level of your heart Legs uncrossed Back supported Sit quietly and don't talk Place the cuff on your bare arm Adjust snuggly, so that only two fingertips  can fit between your skin and the top of the cuff Check 2 readings separated by at least one minute Keep a log of your BP readings For a visual, please reference this diagram: http://ccnc.care/bpdiagram  Provider Name: [facility]     Phone: [phone]  Zone 1: ALL CLEAR  Continue to monitor your symptoms:  BP reading is less than 140 (top number) or less than 90 (bottom number)  No right  upper stomach pain No headaches or seeing spots No feeling nauseated or throwing up No swelling in face and hands  Zone 2: CAUTION Call your doctor's office for any of the following:  BP reading is greater than 140 (top number) or greater than 90 (bottom number)  Stomach pain under your ribs in the middle or right side Headaches or seeing spots Feeling nauseated or throwing up Swelling in face and hands  Zone 3: EMERGENCY  Seek immediate medical care if you have any of the following:  BP reading is greater than160 (top number) or greater than 110 (bottom number) Severe headaches not improving with Tylenol  Serious difficulty catching your breath Any worsening symptoms from Zone 2   Braxton Hicks Contractions Contractions of the uterus can occur throughout pregnancy, but they are not always a sign that you are in labor. You may have practice contractions called Braxton Hicks contractions. These false labor contractions are sometimes confused with true labor. What are Darol Irving contractions? Braxton Hicks contractions are tightening movements that occur in the muscles of the uterus before labor. Unlike true labor contractions, these contractions do not result in opening (dilation) and thinning of the cervix. Toward the end of pregnancy (32-34 weeks), Braxton Hicks contractions can happen more often and may become stronger. These contractions are sometimes difficult to tell apart from true labor because they can be very uncomfortable. You should not feel embarrassed if you go to the hospital with false labor. Sometimes, the only way to tell if you are in true labor is for your health care provider to look for changes in the cervix. The health care provider will do a physical exam and may monitor your contractions. If you are not in true labor, the exam should show that your cervix is not dilating and your water has not broken. If there are no other health problems associated with your  pregnancy, it is completely safe for you to be sent home with false labor. You may continue to have Braxton Hicks contractions until you go into true labor. How to tell the difference between true labor and false labor True labor Contractions last 30-70 seconds. Contractions become very regular. Discomfort is usually felt in the top of the uterus, and it spreads to the lower abdomen and low back. Contractions do not go away with walking. Contractions usually become more intense and increase in frequency. The cervix dilates and gets thinner. False labor Contractions are usually shorter and not as strong as true labor contractions. Contractions are usually irregular. Contractions are often felt in the front of the lower abdomen and in the groin. Contractions may go away when you walk around or change positions while lying down. Contractions get weaker and are shorter-lasting as time goes on. The cervix usually does not dilate or become thin. Follow these instructions at home:  Take over-the-counter and prescription medicines only as told by your health care provider. Keep up with your usual exercises and follow other instructions from your health care provider. Eat and drink lightly if you think  you are going into labor. If Braxton Hicks contractions are making you uncomfortable: Change your position from lying down or resting to walking, or change from walking to resting. Sit and rest in a tub of warm water. Drink enough fluid to keep your urine pale yellow. Dehydration may cause these contractions. Do slow and deep breathing several times an hour. Keep all follow-up prenatal visits as told by your health care provider. This is important. Contact a health care provider if: You have a fever. You have continuous pain in your abdomen. Get help right away if: Your contractions become stronger, more regular, and closer together. You have fluid leaking or gushing from your vagina. You pass  blood-tinged mucus (bloody show). You have bleeding from your vagina. You have low back pain that you never had before. You feel your baby's head pushing down and causing pelvic pressure. Your baby is not moving inside you as much as it used to. Summary Contractions that occur before labor are called Braxton Hicks contractions, false labor, or practice contractions. Braxton Hicks contractions are usually shorter, weaker, farther apart, and less regular than true labor contractions. True labor contractions usually become progressively stronger and regular, and they become more frequent. Manage discomfort from [facility] contractions by changing position, resting in a warm bath, drinking plenty of water, or practicing deep breathing. This information is not intended to replace advice given to you by your health care provider. Make sure you discuss any questions you have with your health care provider. Document Revised: 01/27/2017 Document Reviewed: 06/30/2016 Elsevier Patient Education  2020 [employer].

## 2023-09-07 NOTE — Progress Notes (Signed)
 HIGH-RISK PREGNANCY VISIT Patient name: Cheyenne Bauer MRN [id number]  Date of birth: 09-Mar-1990 Chief Complaint:   Routine Prenatal Visit  History of Present Illness:   [name] is a 33 y.o. [id number] female at [redacted]w[redacted]d with an Estimated Date of Delivery: 09/21/23 being seen today for ongoing management of a high-risk pregnancy complicated by gestational hypertension currently on no meds.    Today she reports home bp's all wnl. Denies ha, visual changes, ruq/epigastric pain, n/v. Light pink spotting on pad yesterday am- has been pumping trying to get milk supply started. No recent sex. Denies abnormal discharge, itching/odor/irritation.    Contractions: Irritability. Vag. Bleeding: None.  Movement: Present. denies leaking of fluid.      03/09/2023    3:05 PM 05/03/2022   11:27 AM 04/23/2021    9:10 AM 12/29/2020    3:19 PM 07/20/2020   11:39 AM  Depression screen PHQ 2/9  Decreased Interest 1 1 0 1 1  Down, Depressed, Hopeless 1 1 0 1 1  PHQ - 2 Score 2 2 0 2 2  Altered sleeping 1 0 1 1 1   Tired, decreased energy 1 1 1 1 1   Change in appetite 0 3 0 1 1  Feeling bad or failure about yourself  1 2 0 1 1  Trouble concentrating 1 1 1 1 1   Moving slowly or fidgety/restless 1 0 0 1 1  Suicidal thoughts 0 1 0 1 1  PHQ-9 Score 7 10 3 9 9         03/09/2023    3:05 PM 05/03/2022   11:29 AM 04/23/2021    9:11 AM 12/29/2020    3:19 PM  GAD 7 : Generalized Anxiety Score  Nervous, Anxious, on Edge 1 3 0 1  Control/stop worrying 1 3 0 1  Worry too much - different things 1 3 1 1   Trouble relaxing 1 3 0 1  Restless 1 2 0 1  Easily annoyed or irritable 1 3 1 1   Afraid - awful might happen 1 0 0 1  Total GAD 7 Score 7 17 2 7      Review of Systems:   Pertinent items are noted in HPI Denies abnormal vaginal discharge w/ itching/odor/irritation, headaches, visual changes, shortness of breath, chest pain, abdominal pain, severe nausea/vomiting, or problems with urination or bowel movements unless  otherwise stated above. Pertinent History Reviewed:  Reviewed past medical,surgical, social, obstetrical and family history.  Reviewed problem list, medications and allergies. Physical Assessment:   Vitals:   09/07/23 1143  BP: 127/80  Pulse: (!) 112  Weight: 216 lb (98 kg)  Body mass index is 35.94 kg/m.           Physical Examination:   General appearance: alert, well appearing, and in no distress  Mental status: alert, oriented to person, place, and time  Skin: warm & dry   Extremities:      Cardiovascular: normal heart rate noted  Respiratory: normal respiratory effort, no distress  Abdomen: gravid, soft, non-tender  Pelvic: Cervical exam performed, no blood at all Dilation: 1.5 Effacement (%): Thick Station: -3  Fetal Status:     Movement: Present Presentation: Vertex  Fetal Surveillance Testing today: NST: FHR baseline 130 bpm, Variability: moderate, Accelerations:present, Decelerations:  Absent= Cat 1/reactive Toco: none    Chaperone: Alan Fischer  Results for orders placed or performed in visit on 09/07/23 (from the past 24 hours)  POC Urinalysis Dipstick OB   Collection Time: 09/07/23 12:25  PM  Result Value Ref Range   Color, UA     Clarity, UA     Glucose, UA Negative Negative   Bilirubin, UA     Ketones, UA neg    Spec Grav, UA     Blood, UA neg    pH, UA     POC,PROTEIN,UA Negative Negative, Trace, Small (1+), Moderate (2+), Large (3+), 4+   Urobilinogen, UA     Nitrite, UA neg    Leukocytes, UA Negative Negative   Appearance     Odor      Assessment & Plan:  High-risk pregnancy: [id number] at [redacted]w[redacted]d with an Estimated Date of Delivery: 09/21/23   1) GHTN, dx last week, declines IOL til 39w, Dr. Ozan scheduled last week for 7/17. Home bp's wnl, asymptomatic, no proteinuria. Continue checking home bp's (checks TID), if >140/90 or pre-e s/s, go to [facility]  Meds: No orders of the defined types were placed in this encounter.   Labs/procedures today: SVE and  NST  Treatment Plan:  NST Mon, IOL Thurs as scheduled (pt declined earlier)  Reviewed: Term labor symptoms and general obstetric precautions including but not limited to vaginal bleeding, contractions, leaking of fluid and fetal movement were reviewed in detail with the patient.  All questions were answered. Does have home bp cuff. Office bp cuff given: not applicable.   Follow-up: Return for Mon nurse/nst.   Future Appointments  Date Time Provider Department Center  09/11/2023  3:30 PM [facility] NURSE [facility]  09/14/2023  7:15 AM [facility] SCHED ROOM [facility] None    Orders Placed This Encounter  Procedures   POC Urinalysis Dipstick OB   Suzen JONELLE Fetters CNM, [facility] 09/07/2023 12:34 PM

## 2023-09-11 ENCOUNTER — Ambulatory Visit: Admitting: *Deleted

## 2023-09-11 VITALS — BP 131/79 | HR 87

## 2023-09-11 DIAGNOSIS — Z3A38 38 weeks gestation of pregnancy: Secondary | ICD-10-CM

## 2023-09-11 DIAGNOSIS — O133 Gestational [pregnancy-induced] hypertension without significant proteinuria, third trimester: Secondary | ICD-10-CM | POA: Diagnosis not present

## 2023-09-11 DIAGNOSIS — O0993 Supervision of high risk pregnancy, unspecified, third trimester: Secondary | ICD-10-CM

## 2023-09-11 NOTE — Progress Notes (Signed)
   NURSE VISIT- NST  SUBJECTIVE:  Cheyenne Bauer is a 33 y.o. [phone] female at [redacted]w[redacted]d, here for a NST for pregnancy complicated by [facility].  She reports active fetal movement, contractions: none, vaginal bleeding: none, membranes: intact.   OBJECTIVE:  BP 131/79   Pulse 87   LMP 12/15/2022   Appears well, no apparent distress  No results found for this or any previous visit (from the past 24 hours).  NST: FHR baseline 140 bpm, Variability: moderate, Accelerations:present, Decelerations:  Absent= Cat 1/reactive Toco: none   ASSESSMENT: [id number] at [redacted]w[redacted]d with CHTN NST reactive  PLAN: EFM strip reviewed by Dr. Jayne   Recommendations: keep next appointment as scheduled    Cheyenne Bauer  09/11/2023 4:40 PM

## 2023-09-12 ENCOUNTER — Telehealth (HOSPITAL_COMMUNITY): Payer: Self-pay | Admitting: *Deleted

## 2023-09-12 ENCOUNTER — Encounter (HOSPITAL_COMMUNITY): Payer: Self-pay | Admitting: *Deleted

## 2023-09-12 NOTE — Telephone Encounter (Signed)
 Preadmission screen

## 2023-09-14 ENCOUNTER — Inpatient Hospital Stay (HOSPITAL_COMMUNITY)
Admission: RE | Admit: 2023-09-14 | Discharge: 2023-09-16 | DRG: 807 | Disposition: A | Discharge status: Home / Self Care | Attending: Obstetrics and Gynecology | Admitting: Obstetrics and Gynecology

## 2023-09-14 ENCOUNTER — Inpatient Hospital Stay (HOSPITAL_COMMUNITY)

## 2023-09-14 DIAGNOSIS — Z87891 Personal history of nicotine dependence: Secondary | ICD-10-CM | POA: Diagnosis not present

## 2023-09-14 DIAGNOSIS — Z8249 Family history of ischemic heart disease and other diseases of the circulatory system: Secondary | ICD-10-CM

## 2023-09-14 DIAGNOSIS — Z91040 Latex allergy status: Secondary | ICD-10-CM

## 2023-09-14 DIAGNOSIS — F419 Anxiety disorder, unspecified: Secondary | ICD-10-CM | POA: Diagnosis present

## 2023-09-14 DIAGNOSIS — Z7982 Long term (current) use of aspirin: Secondary | ICD-10-CM | POA: Diagnosis not present

## 2023-09-14 DIAGNOSIS — Z23 Encounter for immunization: Secondary | ICD-10-CM | POA: Diagnosis not present

## 2023-09-14 DIAGNOSIS — O99344 Other mental disorders complicating childbirth: Secondary | ICD-10-CM | POA: Diagnosis not present

## 2023-09-14 DIAGNOSIS — O99214 Obesity complicating childbirth: Secondary | ICD-10-CM | POA: Diagnosis present

## 2023-09-14 DIAGNOSIS — Z8759 Personal history of other complications of pregnancy, childbirth and the puerperium: Secondary | ICD-10-CM | POA: Diagnosis present

## 2023-09-14 DIAGNOSIS — O133 Gestational [pregnancy-induced] hypertension without significant proteinuria, third trimester: Secondary | ICD-10-CM

## 2023-09-14 DIAGNOSIS — Z3A37 37 weeks gestation of pregnancy: Secondary | ICD-10-CM

## 2023-09-14 DIAGNOSIS — Z3A39 39 weeks gestation of pregnancy: Secondary | ICD-10-CM | POA: Diagnosis not present

## 2023-09-14 DIAGNOSIS — Z833 Family history of diabetes mellitus: Secondary | ICD-10-CM | POA: Diagnosis not present

## 2023-09-14 DIAGNOSIS — O134 Gestational [pregnancy-induced] hypertension without significant proteinuria, complicating childbirth: Secondary | ICD-10-CM | POA: Diagnosis present

## 2023-09-14 DIAGNOSIS — O26893 Other specified pregnancy related conditions, third trimester: Secondary | ICD-10-CM | POA: Diagnosis present

## 2023-09-14 DIAGNOSIS — O9902 Anemia complicating childbirth: Secondary | ICD-10-CM | POA: Diagnosis present

## 2023-09-14 DIAGNOSIS — Z2839 Other underimmunization status: Secondary | ICD-10-CM

## 2023-09-14 DIAGNOSIS — O139 Gestational [pregnancy-induced] hypertension without significant proteinuria, unspecified trimester: Principal | ICD-10-CM | POA: Diagnosis present

## 2023-09-14 MED ORDER — ONDANSETRON HCL 4 MG/2ML IJ SOLN: 4.0000 mg | Freq: Four times a day (QID) | INTRAMUSCULAR | Status: DC | PRN

## 2023-09-14 MED ORDER — OXYTOCIN-SODIUM CHLORIDE 30-0.9 UT/500ML-% IV SOLN
2.5000 [IU]/h | INTRAVENOUS | Status: DC
  Administered 2023-09-15: 2.5 [IU]/h via INTRAVENOUS

## 2023-09-14 MED ORDER — OXYCODONE-ACETAMINOPHEN 5-325 MG PO TABS: 2.0000 | ORAL_TABLET | ORAL | Status: DC | PRN

## 2023-09-14 MED ORDER — OXYTOCIN BOLUS FROM INFUSION
333.0000 mL | Freq: Once | INTRAVENOUS | Status: AC
  Administered 2023-09-15: 333 mL via INTRAVENOUS

## 2023-09-14 MED ORDER — LACTATED RINGERS IV SOLN
500.0000 mL | INTRAVENOUS | Status: DC | PRN
  Administered 2023-09-15: 500 mL via INTRAVENOUS

## 2023-09-14 MED ORDER — OXYCODONE-ACETAMINOPHEN 5-325 MG PO TABS: 1.0000 | ORAL_TABLET | ORAL | Status: DC | PRN

## 2023-09-14 MED ORDER — MISOPROSTOL 50MCG HALF TABLET
50.0000 ug | ORAL_TABLET | Freq: Once | ORAL | Status: AC
  Administered 2023-09-15: 50 ug via ORAL
  Filled 2023-09-14: qty 1

## 2023-09-14 MED ORDER — ACETAMINOPHEN 325 MG PO TABS: 650.0000 mg | ORAL_TABLET | ORAL | Status: DC | PRN

## 2023-09-14 MED ORDER — LACTATED RINGERS IV SOLN: INTRAVENOUS | Status: DC

## 2023-09-14 MED ORDER — LIDOCAINE HCL (PF) 1 % IJ SOLN: 30.0000 mL | INTRAMUSCULAR | Status: DC | PRN

## 2023-09-14 MED ORDER — FENTANYL CITRATE (PF) 100 MCG/2ML IJ SOLN: 50.0000 ug | INTRAMUSCULAR | Status: DC | PRN

## 2023-09-14 MED ORDER — MISOPROSTOL 25 MCG QUARTER TABLET
25.0000 ug | ORAL_TABLET | Freq: Once | ORAL | Status: AC
Start: 2023-09-15 — End: 2023-09-15
  Administered 2023-09-15: 25 ug via VAGINAL
  Filled 2023-09-14: qty 1
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
## 2023-09-15 ENCOUNTER — Inpatient Hospital Stay (HOSPITAL_COMMUNITY): Admitting: Anesthesiology

## 2023-09-15 ENCOUNTER — Other Ambulatory Visit: Payer: Self-pay

## 2023-09-15 ENCOUNTER — Encounter (HOSPITAL_COMMUNITY): Payer: Self-pay | Admitting: Obstetrics & Gynecology

## 2023-09-15 DIAGNOSIS — O134 Gestational [pregnancy-induced] hypertension without significant proteinuria, complicating childbirth: Secondary | ICD-10-CM

## 2023-09-15 DIAGNOSIS — O139 Gestational [pregnancy-induced] hypertension without significant proteinuria, unspecified trimester: Secondary | ICD-10-CM

## 2023-09-15 DIAGNOSIS — Z3A39 39 weeks gestation of pregnancy: Secondary | ICD-10-CM

## 2023-09-15 DIAGNOSIS — O99344 Other mental disorders complicating childbirth: Secondary | ICD-10-CM

## 2023-09-15 LAB — CBC
HCT: 31.7 % — ABNORMAL LOW (ref 36.0–46.0)
HCT: 34.9 % — ABNORMAL LOW (ref 36.0–46.0)
Hemoglobin: 10.2 g/dL — ABNORMAL LOW (ref 12.0–15.0)
Hemoglobin: 11.1 g/dL — ABNORMAL LOW (ref 12.0–15.0)
MCH: 25.4 pg — ABNORMAL LOW (ref 26.0–34.0)
MCH: 25.3 pg — ABNORMAL LOW (ref 26.0–34.0)
MCHC: 32.2 g/dL (ref 30.0–36.0)
MCHC: 31.8 g/dL (ref 30.0–36.0)
MCV: 78.9 fL — ABNORMAL LOW (ref 80.0–100.0)
MCV: 79.7 fL — ABNORMAL LOW (ref 80.0–100.0)
Platelets: 339 K/uL (ref 150–400)
Platelets: 336 K/uL (ref 150–400)
RBC: 4.02 MIL/uL (ref 3.87–5.11)
RBC: 4.38 MIL/uL (ref 3.87–5.11)
RDW: 14.5 % (ref 11.5–15.5)
RDW: 14.7 % (ref 11.5–15.5)
WBC: 7.8 K/uL (ref 4.0–10.5)
WBC: 8.3 K/uL (ref 4.0–10.5)
nRBC: 0 % (ref 0.0–0.2)
nRBC: 0 % (ref 0.0–0.2)

## 2023-09-15 LAB — COMPREHENSIVE METABOLIC PANEL WITH GFR
ALT: 16 U/L (ref 0–44)
AST: 17 U/L (ref 15–41)
Albumin: 2.6 g/dL — ABNORMAL LOW (ref 3.5–5.0)
Alkaline Phosphatase: 114 U/L (ref 38–126)
Anion gap: 9 (ref 5–15)
BUN: 14 mg/dL (ref 6–20)
CO2: 21 mmol/L — ABNORMAL LOW (ref 22–32)
Calcium: 9.3 mg/dL (ref 8.9–10.3)
Chloride: 104 mmol/L (ref 98–111)
Creatinine, Ser: 0.64 mg/dL (ref 0.44–1.00)
GFR, Estimated: >60 mL/min (ref >60)
Glucose, Bld: 80 mg/dL (ref 70–99)
Potassium: 3.8 mmol/L (ref 3.5–5.1)
Sodium: 134 mmol/L — ABNORMAL LOW (ref 135–145)
Total Bilirubin: 0.3 mg/dL (ref 0.0–1.2)
Total Protein: 5.8 g/dL — ABNORMAL LOW (ref 6.5–8.1)

## 2023-09-15 LAB — TYPE AND SCREEN
ABO/RH(D): A POS
Antibody Screen: NEGATIVE

## 2023-09-15 LAB — PROTEIN / CREATININE RATIO, URINE
Creatinine, Urine: 82 mg/dL
Protein Creatinine Ratio: 0.07 mg/mg{creat} (ref 0.00–0.15)
Total Protein, Urine: 6 mg/dL

## 2023-09-15 LAB — RPR: RPR Ser Ql: NONREACTIVE

## 2023-09-15 MED ORDER — BENZOCAINE-MENTHOL 20-0.5 % EX AERO
1.0000 | INHALATION_SPRAY | CUTANEOUS | Status: DC | PRN
  Filled 2023-09-15: qty 56

## 2023-09-15 MED ORDER — EPHEDRINE 5 MG/ML INJ: 10.0000 mg | INTRAVENOUS | Status: DC | PRN

## 2023-09-15 MED ORDER — LIDOCAINE HCL (PF) 1 % IJ SOLN
INTRAMUSCULAR | Status: DC | PRN
Start: 2023-09-15 — End: 2023-09-15
  Administered 2023-09-15: 2 mL via EPIDURAL
  Administered 2023-09-15: 3 mL via EPIDURAL
  Administered 2023-09-15: 5 mL via EPIDURAL

## 2023-09-15 MED ORDER — TERBUTALINE SULFATE 1 MG/ML IJ SOLN: 0.2500 mg | Freq: Once | INTRAMUSCULAR | Status: DC | PRN

## 2023-09-15 MED ORDER — DIBUCAINE (PERIANAL) 1 % EX OINT: 1.0000 | TOPICAL_OINTMENT | CUTANEOUS | Status: DC | PRN

## 2023-09-15 MED ORDER — PRENATAL MULTIVITAMIN CH
1.0000 | ORAL_TABLET | Freq: Every day | ORAL | Status: DC
  Administered 2023-09-16: 1 via ORAL
  Filled 2023-09-15: qty 1

## 2023-09-15 MED ORDER — SIMETHICONE 80 MG PO CHEW: 80.0000 mg | CHEWABLE_TABLET | ORAL | Status: DC | PRN

## 2023-09-15 MED ORDER — WITCH HAZEL-GLYCERIN EX PADS: 1.0000 | MEDICATED_PAD | CUTANEOUS | Status: DC | PRN

## 2023-09-15 MED ORDER — TETANUS-DIPHTH-ACELL PERTUSSIS 5-2.5-18.5 LF-MCG/0.5 IM SUSY: 0.5000 mL | PREFILLED_SYRINGE | Freq: Once | INTRAMUSCULAR | Status: DC

## 2023-09-15 MED ORDER — PHENYLEPHRINE 80 MCG/ML (10ML) SYRINGE FOR IV PUSH (FOR BLOOD PRESSURE SUPPORT): 80.0000 ug | PREFILLED_SYRINGE | INTRAVENOUS | Status: DC | PRN

## 2023-09-15 MED ORDER — IBUPROFEN 600 MG PO TABS
600.0000 mg | ORAL_TABLET | Freq: Four times a day (QID) | ORAL | Status: DC
  Administered 2023-09-15 – 2023-09-16 (×4): 600 mg via ORAL
  Filled 2023-09-15 (×4): qty 1

## 2023-09-15 MED ORDER — FENTANYL-BUPIVACAINE-NACL 0.5-0.125-0.9 MG/250ML-% EP SOLN
12.0000 mL/h | EPIDURAL | Status: DC | PRN
  Administered 2023-09-15: 12 mL/h via EPIDURAL
  Filled 2023-09-15: qty 250

## 2023-09-15 MED ORDER — ONDANSETRON HCL 4 MG PO TABS: 4.0000 mg | ORAL_TABLET | ORAL | Status: DC | PRN

## 2023-09-15 MED ORDER — FUROSEMIDE 20 MG PO TABS
20.0000 mg | ORAL_TABLET | Freq: Every day | ORAL | Status: DC
  Administered 2023-09-15 – 2023-09-16 (×2): 20 mg via ORAL
  Filled 2023-09-15 (×2): qty 1

## 2023-09-15 MED ORDER — ACETAMINOPHEN 325 MG PO TABS
650.0000 mg | ORAL_TABLET | ORAL | Status: DC | PRN
  Administered 2023-09-15 – 2023-09-16 (×2): 650 mg via ORAL
  Filled 2023-09-15 (×2): qty 2

## 2023-09-15 MED ORDER — OXYTOCIN-SODIUM CHLORIDE 30-0.9 UT/500ML-% IV SOLN
1.0000 m[IU]/min | INTRAVENOUS | Status: DC
  Administered 2023-09-15: 2 m[IU]/min via INTRAVENOUS
  Filled 2023-09-15: qty 500

## 2023-09-15 MED ORDER — COCONUT OIL OIL
1.0000 | TOPICAL_OIL | Status: DC | PRN
  Administered 2023-09-16: 1 via TOPICAL

## 2023-09-15 MED ORDER — POTASSIUM CHLORIDE CRYS ER 20 MEQ PO TBCR
20.0000 meq | EXTENDED_RELEASE_TABLET | Freq: Every day | ORAL | Status: DC
  Administered 2023-09-15 – 2023-09-16 (×2): 20 meq via ORAL
  Filled 2023-09-15 (×2): qty 1

## 2023-09-15 MED ORDER — LACTATED RINGERS IV SOLN
500.0000 mL | Freq: Once | INTRAVENOUS | Status: AC
  Administered 2023-09-15: 500 mL via INTRAVENOUS

## 2023-09-15 MED ORDER — ONDANSETRON HCL 4 MG/2ML IJ SOLN: 4.0000 mg | INTRAMUSCULAR | Status: DC | PRN

## 2023-09-15 MED ORDER — SENNOSIDES-DOCUSATE SODIUM 8.6-50 MG PO TABS
2.0000 | ORAL_TABLET | Freq: Every day | ORAL | Status: DC
  Administered 2023-09-16: 2 via ORAL
  Filled 2023-09-15: qty 2

## 2023-09-15 MED ORDER — DIPHENHYDRAMINE HCL 25 MG PO CAPS: 25.0000 mg | ORAL_CAPSULE | Freq: Four times a day (QID) | ORAL | Status: DC | PRN

## 2023-09-15 MED ORDER — DIPHENHYDRAMINE HCL 50 MG/ML IJ SOLN: 12.5000 mg | INTRAMUSCULAR | Status: DC | PRN

## 2023-09-15 NOTE — Plan of Care (Signed)

## 2023-09-15 NOTE — Discharge Summary (Signed)
 Postpartum Discharge Summary  Date of Service updated 09/16/2023    Patient Name: Cheyenne Bauer DOB: 02-20-1991 MRN: [id number]  Date of admission: 09/14/2023 Delivery date:09/15/2023 Delivering provider: VANNIE MATAR R Date of discharge: 09/16/2023  Admitting diagnosis: Gestational hypertension [O13.9] Intrauterine pregnancy: [redacted]w[redacted]d     Secondary diagnosis:  Principal Problem:   Gestational hypertension Active Problems:   Rubella non-immune status, antepartum   Anxiety and depression  Additional problems: None    Discharge diagnosis: Term Pregnancy Delivered and Gestational Hypertension                                              Postpartum procedures:None Augmentation: AROM, Pitocin , and Cytotec  Complications: None  Hospital course: Induction of Labor With Vaginal Delivery   33 y.o. yo [id number] at [redacted]w[redacted]d was admitted to the hospital 09/14/2023 for induction of labor.  Indication for induction: Gestational hypertension.  Patient had an labor course complicated by none. Membrane Rupture Time/Date: 12:08 PM,09/15/2023  Delivery Method:Vaginal, Spontaneous Operative Delivery:N/A Episiotomy: None Lacerations:  1st degree Details of delivery can be found in separate delivery note.  Patient had an uncomplicated postpartum course. Patient is discharged home 09/16/23.  Newborn Data: Birth date:09/15/2023 Birth time:2:32 PM Gender:Female Living status:Living Apgars:9 ,9  Weight:3390 g  Magnesium Sulfate received: No BMZ received: No Rhophylac:N/A MMR:N/A T-DaP:Given prenatally Flu: N/A RSV Vaccine received: No Transfusion:No  Immunizations received: Immunization History  Administered Date(s) Administered   Influenza,inj,Quad PF,6+ Mos 05/14/2018, 11/30/2018   Tdap 11/23/2018, 05/04/2021, 07/18/2023   Physical exam  Vitals:   09/15/23 1745 09/15/23 2156 09/16/23 0230 09/16/23 0542  BP: 116/73 127/72 (P) 124/78 119/60  Pulse: 87  68 87  Resp: 20 18 16 16   Temp: 97.9 F  (36.6 C) 98.3 F (36.8 C) 97.8 F (36.6 C) 97.9 F (36.6 C)  TempSrc: Oral Oral Axillary Oral  SpO2: 99% 99% 98% 99%  Weight:      Height:       General: alert, cooperative, and no distress Lochia: appropriate Uterine Fundus: firm Incision: N/A DVT Evaluation: No evidence of DVT seen on physical exam. Negative Homan's sign. No cords or calf tenderness. Labs: Lab Results  Component Value Date   WBC 8.3 09/15/2023   HGB 11.1 (L) 09/15/2023   HCT 34.9 (L) 09/15/2023   MCV 79.7 (L) 09/15/2023   PLT 336 09/15/2023      Latest Ref Rng & Units 09/14/2023   11:52 PM  CMP  Glucose 70 - 99 mg/dL 80   BUN 6 - 20 mg/dL 14   Creatinine 9.55 - 1.00 mg/dL 9.35   Sodium 864 - 854 mmol/L 134   Potassium 3.5 - 5.1 mmol/L 3.8   Chloride 98 - 111 mmol/L 104   CO2 22 - 32 mmol/L 21   Calcium  8.9 - 10.3 mg/dL 9.3   Total Protein 6.5 - 8.1 g/dL 5.8   Total Bilirubin 0.0 - 1.2 mg/dL 0.3   Alkaline Phos 38 - 126 U/L 114   AST 15 - 41 U/L 17   ALT 0 - 44 U/L 16    Edinburgh Score:    08/05/2021   11:13 AM  Edinburgh Postnatal Depression Scale Screening Tool  I have been able to laugh and see the funny side of things. 0  I have looked forward with enjoyment to things. 0  I have blamed myself unnecessarily when  things went wrong. 1  I have been anxious or worried for no good reason. 1  I have felt scared or panicky for no good reason. 1  Things have been getting on top of me. 2  I have been so unhappy that I have had difficulty sleeping. 1  I have felt sad or miserable. 1  I have been so unhappy that I have been crying. 1  The thought of harming myself has occurred to me. 0  Edinburgh Postnatal Depression Scale Total 8      Data saved with a previous flowsheet row definition   No data recorded  After visit meds:  Allergies as of 09/16/2023       Reactions   Covid-19 Mrna Vaccine (pfizer) [covid-19 Mrna Vacc (moderna)] Rash   Latex Rash        Medication List     STOP  taking these medications    aspirin  EC 81 MG tablet   Doxylamine -Pyridoxine  10-10 MG Tbec Commonly known as: Diclegis        TAKE these medications    acetaminophen  325 MG tablet Commonly known as: Tylenol  Take 2 tablets (650 mg total) by mouth every 4 (four) hours as needed (for pain scale < 4).   coconut oil Oil Apply 1 Application topically as needed.   furosemide  20 MG tablet Commonly known as: LASIX  Take 1 tablet (20 mg total) by mouth daily.   ibuprofen  600 MG tablet Commonly known as: ADVIL  Take 1 tablet (600 mg total) by mouth every 6 (six) hours.   potassium chloride  SA 20 MEQ tablet Commonly known as: KLOR-CON  M Take 1 tablet (20 mEq total) by mouth daily for 5 days.   PRENATAL VITAMIN PO Take by mouth.   witch hazel-glycerin  pad Commonly known as: TUCKS Apply 1 Application topically as needed for hemorrhoids.      Discharge home in stable condition Infant Feeding: Breast Infant Disposition:home with mother Discharge instruction: per After Visit Summary and Postpartum booklet. Activity: Advance as tolerated. Pelvic rest for 6 weeks.  Diet: routine diet Future Appointments:No future appointments. Follow up Visit:  Follow-up Information     [facility] for [facility] at [facility]. Schedule an appointment as soon as possible for a visit in 1 week(s).   Specialty: Obstetrics and Gynecology Why: Postpartum blood pressure check in 1 week Contact information: [address] [place]  [address] [phone]        [facility] for [facility] Healthcare at [facility]. Schedule an appointment as soon as possible for a visit in 4 week(s).   Specialty: Obstetrics and Gynecology Why: Routine postpartum follow up in 4-6 weeks Contact information: [address] [place]  [address] [phone]               Message to FT 7/19  Please schedule this patient for a In person  postpartum visit in 4 weeks with the following provider: Any provider. Additional Postpartum F/U:BP check 1 week  High risk pregnancy complicated by: HTN Delivery mode:  Vaginal, Spontaneous Anticipated Birth Control:  Condoms   09/16/2023 Mardy Shropshire, MD

## 2023-09-15 NOTE — Anesthesia Preprocedure Evaluation (Signed)
 Anesthesia Evaluation  Patient identified by MRN, date of birth, ID band Patient awake    Reviewed: Allergy & Precautions, NPO status , Patient's Chart, lab work & pertinent test results  Airway Mallampati: II  TM Distance: >3 FB Neck ROM: Full    Dental  (+) Teeth Intact, Dental Advisory Given   Pulmonary asthma , former smoker   Pulmonary exam normal breath sounds clear to auscultation       Cardiovascular hypertension, Normal cardiovascular exam Rhythm:Regular Rate:Normal     Neuro/Psych  PSYCHIATRIC DISORDERS Anxiety Depression    negative neurological ROS     GI/Hepatic negative GI ROS, Neg liver ROS,,,  Endo/Other  Obesity   Renal/GU negative Renal ROS     Musculoskeletal negative musculoskeletal ROS (+)    Abdominal   Peds  Hematology  (+) Blood dyscrasia, anemia Plt 336k   Anesthesia Other Findings Day of surgery medications reviewed with the patient.  Reproductive/Obstetrics (+) Pregnancy                              Anesthesia Physical Anesthesia Plan  ASA: 3  Anesthesia Plan: Epidural   Post-op Pain Management:    Induction:   PONV Risk Score and Plan: 2 and Treatment may vary due to age or medical condition  Airway Management Planned: Natural Airway  Additional Equipment:   Intra-op Plan:   Post-operative Plan:   Informed Consent: I have reviewed the patients History and Physical, chart, labs and discussed the procedure including the risks, benefits and alternatives for the proposed anesthesia with the patient or authorized representative who has indicated his/her understanding and acceptance.     Dental advisory given  Plan Discussed with:   Anesthesia Plan Comments: (Patient identified. Risks/Benefits/Options discussed with patient including but not limited to bleeding, infection, nerve damage, paralysis, failed block, incomplete pain control, headache,  blood pressure changes, nausea, vomiting, reactions to medication both or allergic, itching and postpartum back pain. Confirmed with bedside nurse the patient's most recent platelet count. Confirmed with patient that they are not currently taking any anticoagulation, have any bleeding history or any family history of bleeding disorders. Patient expressed understanding and wished to proceed. All questions were answered. )         Anesthesia Quick Evaluation

## 2023-09-15 NOTE — Progress Notes (Signed)
 Labor Progress Note  Cheyenne Bauer is a 33 y.o. [phone] at [redacted]w[redacted]d presented for IOL for gHTN  S: Pt doing well, not feeling much  O:  BP 124/71   Pulse 91   Temp 98.2 F (36.8 C) (Oral)   Resp 20   Ht 5' 5.5 (1.664 m)   Wt 98 kg   LMP 12/15/2022   SpO2 100%   BMI 35.40 kg/m  EFM:120bpm/Moderate variability/ 15x15 accels/ None decels CAT: 1 Toco: irreg   CVE:  3/50/-2     A&P: 33 y.o. [id number] [redacted]w[redacted]d  here for  as above  #Labor: Progressing well. Has made change but head not well applied and still very posterior. Will start pitocin  and AROM when able.  #Pain: Epidural #FWB: CAT 1 #GBS negative  #gHTN: Bps well controlled. Pre-E labs reviewed and nml   Chiquita Clover, MD PGY-3 [facility] Family Medicine Resident [facility] Center for [facility] Healthcare 09/15/23  5:01 AM

## 2023-09-15 NOTE — Progress Notes (Addendum)
 Patient ID: Cheyenne Bauer, female   DOB: Jan 10, 1991, 33 y.o.   MRN: [id number]  Patient Vitals for the past 4 hrs:  BP Pulse  09/15/23 1025 (!) 107/57 68  09/15/23 0855 133/80 74   Dilation: 5 Effacement (%): 60 Station: -1 Presentation: Vertex Exam by:: Vernell, SNM  Labor progressing well. Currently at Pitocin  8 mu/min. UC 2-5 minutes apart. Reviewed option for AROM as next step which patient desires. SVE finds fetal head well applied to cervix. AROM successful on first attempt with [place]. Clear fluid returned. Plan to decrease Pitocin  to 6 mu/min and have epidural placed. FHR remained Cat 1 through AROM and after.   Patient desires cord blood banking. Kit at bedside. Instructions for collection reviewed by [place], CNM.  Vernell Ruddle, [facility] 09/15/23 1232

## 2023-09-15 NOTE — Anesthesia Procedure Notes (Signed)
 Epidural Patient location during procedure: OB Start time: 09/15/2023 1:03 PM End time: 09/15/2023 1:10 PM  Staffing Anesthesiologist: Corinne Garnette BRAVO, MD Performed: anesthesiologist   Preanesthetic Checklist Completed: patient identified, IV checked, risks and benefits discussed, monitors and equipment checked, pre-op evaluation and timeout performed  Epidural Patient position: sitting Prep: DuraPrep Patient monitoring: blood pressure and continuous pulse ox Approach: midline Location: L3-L4 Injection technique: LOR air  Needle:  Needle type: Tuohy  Needle gauge: 17 G Needle length: 9 cm Needle insertion depth: 7 cm Catheter size: 19 Gauge Catheter at skin depth: 12 cm Test dose: negative and Other (1% Lidocaine )  Additional Notes Patient identified.  Risk benefits discussed including failed block, incomplete pain control, headache, nerve damage, paralysis, blood pressure changes, nausea, vomiting, reactions to medication both toxic or allergic, and postpartum back pain.  Patient expressed understanding and wished to proceed.  All questions were answered.  Sterile technique used throughout procedure and epidural site dressed with sterile barrier dressing. No paresthesia or other complications noted. The patient did not experience any signs of intravascular injection such as tinnitus or metallic taste in mouth nor signs of intrathecal spread such as rapid motor block. Please see nursing notes for vital signs. Reason for block:procedure for pain

## 2023-09-16 ENCOUNTER — Other Ambulatory Visit (HOSPITAL_COMMUNITY): Payer: Self-pay

## 2023-09-16 MED ORDER — COCONUT OIL OIL: 1.0000 | TOPICAL_OIL | Status: DC | PRN

## 2023-09-16 MED ORDER — WITCH HAZEL-GLYCERIN EX PADS: 1.0000 | MEDICATED_PAD | CUTANEOUS | Status: DC | PRN

## 2023-09-16 MED ORDER — IBUPROFEN 600 MG PO TABS
600.0000 mg | ORAL_TABLET | Freq: Four times a day (QID) | ORAL | 0 refills | Status: DC
  Filled 2023-09-16: qty 30, 8d supply, fill #0

## 2023-09-16 MED ORDER — ACETAMINOPHEN 325 MG PO TABS: 650.0000 mg | ORAL_TABLET | ORAL | Status: DC | PRN

## 2023-09-16 MED ORDER — FUROSEMIDE 20 MG PO TABS
20.0000 mg | ORAL_TABLET | Freq: Every day | ORAL | 0 refills | Status: DC
  Filled 2023-09-16: qty 5, 5d supply, fill #0

## 2023-09-16 MED ORDER — POTASSIUM CHLORIDE CRYS ER 20 MEQ PO TBCR
20.0000 meq | EXTENDED_RELEASE_TABLET | Freq: Every day | ORAL | 0 refills | Status: DC
  Filled 2023-09-16: qty 5, 5d supply, fill #0

## 2023-09-16 MED ORDER — MEASLES, MUMPS & RUBELLA VAC IJ SOLR
0.5000 mL | Freq: Once | INTRAMUSCULAR | Status: AC
  Administered 2023-09-16: 0.5 mL via SUBCUTANEOUS
  Filled 2023-09-16: qty 0.5

## 2023-09-16 NOTE — Progress Notes (Signed)
 Pt has order for Postpartum Hypertension [employer]. After attempting to enroll pt, Pt is not able to  enter blood pressures into app. Pt advised to continue to log blood pressures twice a day and to call OB office for blood pressures greater than or equal to 135/85. Pt also advised to call [employer] Client support to resolve problem with app. Pt verbalized understanding.

## 2023-09-16 NOTE — Anesthesia Postprocedure Evaluation (Signed)
 Anesthesia Post Note  Patient: Cheyenne Bauer  Procedure(s) Performed: AN AD HOC LABOR EPIDURAL     Patient location during evaluation: Mother Baby Anesthesia Type: Epidural Level of consciousness: awake and alert and oriented Pain management: satisfactory to patient Vital Signs Assessment: post-procedure vital signs reviewed and stable Respiratory status: respiratory function stable Cardiovascular status: stable Postop Assessment: no headache, no backache, epidural receding, patient able to bend at knees, no signs of nausea or vomiting, adequate PO intake and able to ambulate Anesthetic complications: no   No notable events documented.  Last Vitals:  Vitals:   09/16/23 0230 09/16/23 0542  BP: (P) 124/78 119/60  Pulse: 68 87  Resp: 16 16  Temp: 36.6 C 36.6 C  SpO2: 98% 99%    Last Pain:  Vitals:   09/16/23 0542  TempSrc: Oral  PainSc:    Pain Goal: Patients Stated Pain Goal: 4 (09/15/23 0515)                 PURNELL PORTAL

## 2023-09-16 NOTE — Clinical Social Work Maternal (Signed)
 CLINICAL SOCIAL WORK MATERNAL/CHILD NOTE  Patient Details  Name: Cheyenne Bauer MRN: [id number] Date of Birth: 09/25/1990  Date:  09/16/2023  Clinical Social Worker Initiating Note:  [place] Hope Date/Time: Initiated:  09/16/23/1459     Child's Name:  Cheyenne Bauer   Biological Parents:  Mother, Father   Need for Interpreter:  None   Reason for Referral:  Behavioral Health Concerns   Address:  [address] [place] [address]    Phone number:  [phone] (home)     Additional phone number:   Household Members/Support Persons (HM/SP):   Household Member/Support Person 1, Household Member/Support Person 2, Household Member/Support Person 3   HM/SP Name Relationship DOB or Age  HM/SP -1 Armanie Ullmer FOB 11/24/1993  HM/SP -2 Frederic Bauer son 12/08/18  HM/SP -3 Devin Mcfarren son 06/30/21  HM/SP -4        HM/SP -5        HM/SP -6        HM/SP -7        HM/SP -8          Natural Supports (not living in the home):  Extended Family, Immediate Family (Per [name], FOB's family is also supportive.)   Professional Supports: None   Employment: Unemployed   Type of Work:     Education:  Research scientist (physical sciences)   Homebound arranged:    Architect:  Aeronautical engineer:   (per [employer], family does not qualify for Sales executive or WIC.)   Cultural/Religious Considerations Which May Impact Care:  Non reported  Strengths:  Ability to meet basic needs  , Optometrist chosen, Home prepared for child  , Understanding of illness   Psychotropic Medications:         Pediatrician:    [place]  Pediatrician List:   [facility] [place]    [place] [facility]  [facility]      Pediatrician Fax Number:    Risk Factors/Current Problems:  Mental Health Concerns     Cognitive State:  Alert  , Insightful  , Able to Concentrate  , Linear Thinking     Mood/Affect:   Comfortable  , Interested  , Calm  , Happy  , Relaxed     CSW Assessment: CSW meet with [name] in room 503 to complete an assessment for mental health history.  When CSW arrived, [name] was in the bed attaching and bonding with infant as evidence by [name] engaging in skin to skin. FOB was also present and [name] gave CSW permission for CSW to complete assessment while FOB remained in the room; FOB did not engage with CSW.  [name] was inviting and polite.   CSW inquired about [name]'s MH hx and [name] acknowledged a dx of Borderline Personality Disorder and depression.  Per [name], [name] was dx in 2020 and since has tried various medication regiments. Per [name] she is not currently taking any medications and is not in therapy. However, she plans to resume therapy and initiate medication post discharge.  [name] acknowledged PPD symptoms and recognize the importance of starting  a regiment to maintain her mental health. CSW educated [name] about PPD. CSW informed [name] of possible supports and interventions to decrease PPD.  CSW also encouraged [name] to seek medical attention if needed for increased signs and symptoms of PPD.   CSW recommends self-evaluation during the postpartum time period  using the New Mom Checklist from Postpartum Progress and encouraged [name] to contact a medical professional if symptoms are noted at any time.  [name] presented with insight and awareness and reported having a great support team that consists of FOB's family.  CSW assessed for safety and [name] denied SI and HI. [name] expressed feeling comfortable seeking help if help is needed.   Per [name] the family has all essential items to care for infant post discharge.   CSW Plan/Description:  No Further Intervention Required/No Barriers to Discharge, Perinatal Mood and Anxiety Disorder (PMADs) Education, Other Information/Referral to [employer], MSW, [employer] Clinical Social Work [phone]  CLAYBORNE JONETTA HOPE, LCSW 09/16/2023, 3:02 PM

## 2023-09-16 NOTE — Lactation Note (Signed)
 This note was copied from a baby's chart. Lactation Consultation Note  Patient Name: Cheyenne Bauer Date: 09/16/2023 Age:31 hours Reason for consult: Initial assessment;Term  P3, 39 wks, @ 21 hrs of life. Mom desires early discharge once baby 22 hrs old. Mom has breast fed both previous children with issues/difficulties- ties, lactose intolerances. Encouraged mom this should get started easier (with each baby). Mom has her own pump @ bedside- reassured her that a DEBP won't work well until our milk transitions. Mom agrees hand expression is better. Hand pump provided - 18 mm now, 21 and 24 provided as well, highlighted flange sizes dynamic with breast changes.  Encouraged mom to keep working on big mouth latch with baby and use EBM or coconut oil after each feed. Discussed cluster feeding overnight/ early morning brings in our milk supply, shared expectations of milk coming in. Highlighted risk of engorgement. Discussed hand pump/express to soften breasts, motrin  as anti-inflammatory, and ice packs for 10-20 minutes post feed/pumping if still over-full is the best treatments for inflamed/engorged breasts.  Maternal Data Has patient been taught Hand Expression?: Yes  Feeding Mother's Current Feeding Choice: Breast Milk   Lactation Tools Discussed/Used Tools: Pump;Flanges Flange Size: 18 Breast pump type: Manual Pump Education: Milk Storage;Setup, frequency, and cleaning  Interventions Interventions: Breast feeding basics reviewed;Hand express;Breast compression;Expressed milk;Coconut oil;Hand pump;DEBP;Education;LC Services brochure;CDC milk storage guidelines  Discharge Discharge Education: Engorgement and breast care Pump: DEBP;Manual;Personal  Consult Status Consult Status: Complete    [name] 09/16/2023, 11:47 AM

## 2023-09-19 ENCOUNTER — Encounter: Payer: Self-pay | Admitting: Women's Health

## 2023-09-19 ENCOUNTER — Other Ambulatory Visit: Payer: Self-pay | Admitting: Women's Health

## 2023-09-19 ENCOUNTER — Encounter: Payer: Self-pay | Admitting: *Deleted

## 2023-09-19 MED ORDER — HYDROCORTISONE ACETATE 25 MG RE SUPP
25.0000 mg | Freq: Two times a day (BID) | RECTAL | 0 refills | Status: DC
Start: 2023-09-19 — End: 2023-10-10

## 2023-09-22 ENCOUNTER — Telehealth (INDEPENDENT_AMBULATORY_CARE_PROVIDER_SITE_OTHER): Admitting: *Deleted

## 2023-09-22 VITALS — BP 118/80 | HR 77

## 2023-09-22 DIAGNOSIS — Z8759 Personal history of other complications of pregnancy, childbirth and the puerperium: Secondary | ICD-10-CM

## 2023-09-22 NOTE — Progress Notes (Signed)
   NURSE VISIT- BLOOD PRESSURE CHECK  I connected with Cheyenne Bauer on 09/22/2023 by MyChart video and verified that I am speaking with the correct person using two identifiers.   I discussed the limitations of evaluation and management by telemedicine. The patient expressed understanding and agreed to proceed.  Nurse is at the office, and patient is at home.  SUBJECTIVE:  Cheyenne Bauer is a 33 y.o. [phone] female here for BP check. She is postpartum, delivery date 09/15/23    HYPERTENSION ROS:  Postpartum:  Severe headaches that don't go away with tylenol /other medicines: No  Visual changes (seeing spots/double/blurred vision) No  Severe pain under right breast breast or in center of upper chest No  Severe nausea/vomiting No  Taking medicines as instructed: pt prescribed Lasix  but only took a couple doses as her milk started to dry up    OBJECTIVE:  BP 118/80 (BP Location: Right Arm, Patient Position: Sitting, Cuff Size: Normal)   Pulse 77   LMP 12/15/2022   Breastfeeding Yes   Appearance alert, well appearing, and in no distress.  ASSESSMENT: Postpartum  blood pressure check  PLAN: Discussed with Dr. Ozan   Recommendations: no changes needed   Follow-up: as scheduled   Cheyenne Bauer  09/22/2023 8:57 AM

## 2023-09-25 ENCOUNTER — Encounter: Payer: Self-pay | Admitting: Women's Health

## 2023-09-25 ENCOUNTER — Other Ambulatory Visit: Payer: Self-pay | Admitting: Obstetrics and Gynecology

## 2023-09-25 MED ORDER — FLUCONAZOLE 150 MG PO TABS: 150.0000 mg | ORAL_TABLET | Freq: Once | ORAL | 1 refills | Status: AC

## 2023-09-25 NOTE — Progress Notes (Signed)
Rx for diflucan  

## 2023-10-09 ENCOUNTER — Telehealth: Payer: Self-pay | Admitting: Women's Health

## 2023-10-09 ENCOUNTER — Telehealth: Payer: Self-pay

## 2023-10-09 ENCOUNTER — Telehealth: Payer: Self-pay | Admitting: Obstetrics & Gynecology

## 2023-10-09 NOTE — Telephone Encounter (Signed)
 Spoke with patient about vaginal laceration that occurred during delivery. Lac was stitched but patient thinks stitches came out too early. Consulted with provider patient was added to provider schedule to be seen tomorrow. All questions were answered.

## 2023-10-09 NOTE — Telephone Encounter (Signed)
 Patient called to let us  know that her stitches has come out, still tore, having pain with. Please advise

## 2023-10-09 NOTE — Telephone Encounter (Signed)
 LVM in regards to patient phone call she left earlier with us  regarding some pain she was having. Instructed patient to call us  back or send a MyChart message. Office number given

## 2023-10-09 NOTE — Telephone Encounter (Signed)
 Pt is returning a call from [employer]. Please advise

## 2023-10-10 ENCOUNTER — Encounter: Payer: Self-pay | Admitting: Women's Health

## 2023-10-10 ENCOUNTER — Ambulatory Visit (INDEPENDENT_AMBULATORY_CARE_PROVIDER_SITE_OTHER): Admitting: Women's Health

## 2023-10-10 VITALS — BP 113/70 | HR 71 | Ht 65.0 in | Wt 201.6 lb

## 2023-10-10 DIAGNOSIS — R102 Pelvic and perineal pain: Secondary | ICD-10-CM

## 2023-10-10 DIAGNOSIS — F418 Other specified anxiety disorders: Secondary | ICD-10-CM | POA: Diagnosis not present

## 2023-10-10 DIAGNOSIS — O99345 Other mental disorders complicating the puerperium: Secondary | ICD-10-CM

## 2023-10-10 MED ORDER — BUPROPION HCL ER (XL) 150 MG PO TB24
150.0000 mg | ORAL_TABLET | Freq: Every day | ORAL | 6 refills | Status: DC
Start: 2023-10-10 — End: 2023-10-25

## 2023-10-10 NOTE — Progress Notes (Signed)
 GYN VISIT Patient name: Cheyenne Bauer MRN [id number]  Date of birth: 11-15-1990 Chief Complaint:   Follow-up  History of Present Illness:   Cheyenne Bauer is a 33 y.o. [phone] Caucasian female 3wks s/p SVB being seen today for report of bending over recently and feeling pain where laceration was, has been uncomfortable on and off since. Had 1st degree lac. Breastfeeding (pumping), but plans to stop at end of week. Wants to get back on depression meds. EPDS today 7. Plans condoms Patient's last menstrual period was 12/15/2022.     03/09/2023    3:05 PM 05/03/2022   11:27 AM 04/23/2021    9:10 AM 12/29/2020    3:19 PM 07/20/2020   11:39 AM  Depression screen PHQ 2/9  Decreased Interest 1 1 0 1 1  Down, Depressed, Hopeless 1 1 0 1 1  PHQ - 2 Score 2 2 0 2 2  Altered sleeping 1 0 1 1 1   Tired, decreased energy 1 1 1 1 1   Change in appetite 0 3 0 1 1  Feeling bad or failure about yourself  1 2 0 1 1  Trouble concentrating 1 1 1 1 1   Moving slowly or fidgety/restless 1 0 0 1 1  Suicidal thoughts 0 1 0 1 1  PHQ-9 Score 7 10 3 9 9         03/09/2023    3:05 PM 05/03/2022   11:29 AM 04/23/2021    9:11 AM 12/29/2020    3:19 PM  GAD 7 : Generalized Anxiety Score  Nervous, Anxious, on Edge 1 3 0 1  Control/stop worrying 1 3 0 1  Worry too much - different things 1 3 1 1   Trouble relaxing 1 3 0 1  Restless 1 2 0 1  Easily annoyed or irritable 1 3 1 1   Afraid - awful might happen 1 0 0 1  Total GAD 7 Score 7 17 2 7      Review of Systems:   Pertinent items are noted in HPI Denies fever/chills, dizziness, headaches, visual disturbances, fatigue, shortness of breath, chest pain, abdominal pain, vomiting, abnormal vaginal discharge/itching/odor/irritation, problems with periods, bowel movements, urination, or intercourse unless otherwise stated above.  Pertinent History Reviewed:  Reviewed past medical,surgical, social, obstetrical and family history.  Reviewed problem list, medications and  allergies. Physical Assessment:   Vitals:   10/10/23 1434  BP: 113/70  Pulse: 71  Weight: 201 lb 9.6 oz (91.4 kg)  Height: 5' 5 (1.651 m)  Body mass index is 33.55 kg/m.       Physical Examination:   General appearance: alert, well appearing, and in no distress  Mental status: alert, oriented to person, place, and time  Skin: warm & dry   Cardiovascular: normal heart rate noted  Respiratory: normal respiratory effort, no distress  Abdomen: soft, non-tender   Pelvic: looks like suture may have popped/dissolved, area looks fine though, healing well  Extremities: no edema   Chaperone: Alan Fischer  No results found for this or any previous visit (from the past 24 hours).  Assessment & Plan:  1) Healing 1st degree lac  2) Dep/anx> wants to restart wellbutrin , rx sent, mixed data w/ breastfeeding- plans to stop pumping this week, so just wait til then to start. Understands can take a few weeks to notice improvement  Meds:  Meds ordered this encounter  Medications   buPROPion  (WELLBUTRIN  XL) 150 MG 24 hr tablet    Sig: Take 1 tablet (  150 mg total) by mouth daily.    Dispense:  30 tablet    Refill:  6    No orders of the defined types were placed in this encounter.   Return for As scheduled.  Suzen JONELLE Fetters CNM, [facility] 10/10/2023 3:14 PM

## 2023-10-25 ENCOUNTER — Ambulatory Visit: Admitting: Advanced Practice Midwife

## 2023-10-25 ENCOUNTER — Encounter: Payer: Self-pay | Admitting: Advanced Practice Midwife

## 2023-10-25 MED ORDER — BUPROPION HCL ER (XL) 150 MG PO TB24: 150.0000 mg | ORAL_TABLET | Freq: Every day | ORAL | 12 refills | Status: AC

## 2023-10-25 NOTE — Patient Instructions (Signed)
Use the website www.postpartum.net for helpful postpartum resources!

## 2023-10-25 NOTE — Progress Notes (Signed)
 POSTPARTUM VISIT Patient name: Cheyenne Bauer MRN [id number]  Date of birth: Aug 26, 1990 Chief Complaint:   Postpartum Care  History of Present Illness:   Cheyenne Bauer is a 33 y.o. [phone] Caucasian female being seen today for a postpartum visit. She is 6 weeks postpartum following a spontaneous vaginal delivery at 39.1 gestational weeks. IOL: yes, for gestational hypertension . Anesthesia: epidural.  Laceration: 1st degree.  Complications: none. Inpatient contraception: no.   Pregnancy complicated by gHTN dx @ 36wks with 39wk IOL. Tobacco use: former . Substance use disorder: no. Last pap smear: June 2025 and results were NILM w/ HRHPV negative. Next pap smear due: June 2030 Patient's last menstrual period was 12/15/2022.  Postpartum course has been complicated by taking the usual 5d course of Lasix  and K+; also had a suture 'pop' 3wks ago and has been doing well since. Bleeding none. Bowel function is normal. Bladder function is normal. Urinary incontinence? no, fecal incontinence? no Patient is not sexually active. Last sexual activity: prior to birth of baby. Desired contraception: condoms . Patient does not know about a pregnancy in the future.  Desired family size is unsure number of children.   Upstream - 10/25/23 1439       Pregnancy Intention Screening   Does the patient want to become pregnant in the next year? No    Does the patient's partner want to become pregnant in the next year? No    Would the patient like to discuss contraceptive options today? No      Contraception Wrap Up   Current Method Abstinence    End Method No Contraception Precautions         The pregnancy intention screening data noted above was reviewed. Potential methods of contraception were discussed. The patient elected to proceed with No Contraception Precautions.  Edinburgh Postpartum Depression Screening: positive: H/O mental health disorder: yes anx/dep. Currently on meds: yes started 2-3wks ago  and starting to feel better.  Currently in therapy: no.  Sleeping: as expected.  Appetite: nl.  Still finds joy in things she used to: Yes.  Support at home: spouse/fam.  SI/HI/II: no.  Interested in medicine: Yes.  Interested in therapy: No.  Edinburgh Postnatal Depression Scale - 10/25/23 1439       Edinburgh Postnatal Depression Scale:  In the Past 7 Days   I have been able to laugh and see the funny side of things. 0    I have looked forward with enjoyment to things. 0    I have blamed myself unnecessarily when things went wrong. 2    I have been anxious or worried for no good reason. 2    I have felt scared or panicky for no good reason. 2    Things have been getting on top of me. 2    I have been so unhappy that I have had difficulty sleeping. 1    I have felt sad or miserable. 1    I have been so unhappy that I have been crying. 1    The thought of harming myself has occurred to me. 0    Edinburgh Postnatal Depression Scale Total 11             03/09/2023    3:05 PM 05/03/2022   11:29 AM 04/23/2021    9:11 AM 12/29/2020    3:19 PM  GAD 7 : Generalized Anxiety Score  Nervous, Anxious, on Edge 1 3 0 1  Control/stop worrying 1 3  0 1  Worry too much - different things 1 3 1 1   Trouble relaxing 1 3 0 1  Restless 1 2 0 1  Easily annoyed or irritable 1 3 1 1   Afraid - awful might happen 1 0 0 1  Total GAD 7 Score 7 17 2 7      Baby's course has been uncomplicated. Baby is feeding by bottle. Infant has a pediatrician/family doctor? Yes.  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  Pt has material needs met for her and baby: Yes.   Review of Systems:   Pertinent items are noted in HPI Denies Abnormal vaginal discharge w/ itching/odor/irritation, headaches, visual changes, shortness of breath, chest pain, abdominal pain, severe nausea/vomiting, or problems with urination or bowel movements. Pertinent History Reviewed:  Reviewed past medical,surgical,  obstetrical and family history.  Reviewed problem list, medications and allergies. OB History  Gravida Para Term Preterm AB Living  5 3 3  2 3   SAB IAB Ectopic Multiple Live Births  1 1  0 3    # Outcome Date GA Lbr Len/2nd Weight Sex Type Anes PTL Lv  5 Term 09/15/23 [redacted]w[redacted]d  7 lb 7.6 oz (3.39 kg) M Vag-Spont   LIV     Complications: Gestational hypertension  4 Term 06/30/21 [redacted]w[redacted]d / 01:05 7 lb 13 oz (3.544 kg) M Vag-Spont EPI  LIV     Birth Comments: WDL     Complications: Gestational hypertension  3 Term 12/08/18 [redacted]w[redacted]d 08:15 / 00:41 7 lb 6 oz (3.345 kg) M Vag-Spont EPI N LIV     Birth Comments: none     Complications: Gestational hypertension  2 SAB 01/28/18          1 IAB            Physical Assessment:   Vitals:   10/25/23 1429  BP: 128/79  Pulse: 66  Weight: 197 lb (89.4 kg)  Height: 5' 5 (1.651 m)  Body mass index is 32.78 kg/m.       Physical Examination:   General appearance: alert, well appearing, and in no distress  Mental status: alert, oriented to person, place, and time  Skin: warm & dry   Cardiovascular: normal heart rate noted   Respiratory: normal respiratory effort, no distress   Breasts: deferred, no complaints   Abdomen: soft, non-tender   Pelvic: normal external genitalia, vulva, vagina, cervix, uterus and adnexa. Thin prep pap obtained: No  Rectal: not examined  Extremities: Edema: none         No results found for this or any previous visit (from the past 24 hours).  Assessment & Plan:  1) Postpartum exam 2) Six wks s/p spontaneous vaginal delivery 3) bottle feeding 4) Depression screening> EPDS 11; started on Wellbutrin  150mg  2-3wks ago and starting to improve; wants to continue and will keep us  updated prn 5) Contraception counseling 6) s/p gHTN> stable BP now without meds  Essential components of care per ACOG recommendations:  1.  Mood and well being:  If positive depression screen, discussed and plan developed.  If using tobacco we  discussed reduction/cessation and risk of relapse If current substance abuse, we discussed and referral to local resources was offered.   2. Infant care and feeding:  If breastfeeding, discussed returning to work, pumping, breastfeeding-associated pain, guidance regarding return to fertility while lactating if not using another method. If needed, patient was provided with a letter to be allowed to pump q 2-3hrs to  support lactation in a private location with access to a refrigerator to store breastmilk.   Recommended that all caregivers be immunized for flu, pertussis and other preventable communicable diseases If pt does not have material needs met for her/baby, referred to local resources for help obtaining these.  3. Sexuality, contraception and birth spacing Provided guidance regarding sexuality, management of dyspareunia, and resumption of intercourse Discussed avoiding interpregnancy interval <53mths and recommended birth spacing of 18 months  4. Sleep and fatigue Discussed coping options for fatigue and sleep disruption Encouraged family/partner/community support of 4 hrs of uninterrupted sleep to help with mood and fatigue  5. Physical recovery  If pt had a C/S, assessed incisional pain and providing guidance on normal vs prolonged recovery If pt had a laceration, perineal healing and pain reviewed.  If urinary or fecal incontinence, discussed management and referred to PT or uro/gyn if indicated  Patient is safe to resume physical activity. Discussed attainment of healthy weight.  6.  Chronic disease management Discussed pregnancy complications if any, and their implications for future childbearing and long-term maternal health. Review recommendations for prevention of recurrent pregnancy complications, such as 17 hydroxyprogesterone caproate to reduce risk for recurrent PTB not applicable, or aspirin  to reduce risk of preeclampsia yes. Pt had GDM: no. If yes, 2hr GTT scheduled: not  applicable. Reviewed medications and non-pregnant dosing including consideration of whether pt is breastfeeding using a reliable resource such as LactMed: not applicable Referred for f/u w/ PCP or subspecialist providers as indicated: not applicable  7. Health maintenance Mammogram at 31yo or earlier if indicated Pap smears as indicated  Meds:  Meds ordered this encounter  Medications   buPROPion  (WELLBUTRIN  XL) 150 MG 24 hr tablet    Sig: Take 1 tablet (150 mg total) by mouth daily.    Dispense:  30 tablet    Refill:  12    Supervising Provider:   MARILYNN NEST [[id number]]    Follow-up: Return in about 1 year (around 10/24/2024) for Physical.   No orders of the defined types were placed in this encounter.   Suzen JONETTA Gentry CNM 10/25/2023 4:58 PM
